# Patient Record
Sex: Female | Born: 1950
Health system: Southern US, Community
[De-identification: ages and names within clinical notes are randomized; demographics above are authoritative.]

## PROBLEM LIST (undated history)

## (undated) DIAGNOSIS — I10 Essential (primary) hypertension: Secondary | ICD-10-CM

## (undated) DIAGNOSIS — M1990 Unspecified osteoarthritis, unspecified site: Secondary | ICD-10-CM

## (undated) DIAGNOSIS — E785 Hyperlipidemia, unspecified: Secondary | ICD-10-CM

## (undated) DIAGNOSIS — M8580 Other specified disorders of bone density and structure, unspecified site: Secondary | ICD-10-CM

## (undated) DIAGNOSIS — M1711 Unilateral primary osteoarthritis, right knee: Secondary | ICD-10-CM

## (undated) HISTORY — DX: Hyperlipidemia, unspecified: E78.5

## (undated) HISTORY — DX: Other specified disorders of bone density and structure, unspecified site: M85.80

## (undated) HISTORY — PX: CATARACT EXTRACTION: SUR2

## (undated) HISTORY — PX: OOPHORECTOMY: SHX86

## (undated) HISTORY — DX: Essential (primary) hypertension: I10

## (undated) HISTORY — PX: CLOSED REDUCTION HAND FRACTURE: SHX973

---

## 1983-09-03 HISTORY — PX: TUBAL LIGATION: SHX77

## 2005-11-04 ENCOUNTER — Ambulatory Visit (HOSPITAL_COMMUNITY): Admission: RE | Admit: 2005-11-04 | Discharge: 2005-11-04 | Payer: Self-pay | Admitting: Obstetrics & Gynecology

## 2005-11-14 ENCOUNTER — Other Ambulatory Visit: Admission: RE | Admit: 2005-11-14 | Discharge: 2005-11-14 | Payer: Self-pay | Admitting: Obstetrics & Gynecology

## 2006-12-03 ENCOUNTER — Ambulatory Visit (HOSPITAL_COMMUNITY): Admission: RE | Admit: 2006-12-03 | Discharge: 2006-12-03 | Payer: Self-pay | Admitting: Obstetrics & Gynecology

## 2006-12-18 ENCOUNTER — Other Ambulatory Visit: Admission: RE | Admit: 2006-12-18 | Discharge: 2006-12-18 | Payer: Self-pay | Admitting: Obstetrics & Gynecology

## 2007-12-21 ENCOUNTER — Ambulatory Visit (HOSPITAL_COMMUNITY): Admission: RE | Admit: 2007-12-21 | Discharge: 2007-12-21 | Payer: Self-pay | Admitting: Obstetrics & Gynecology

## 2007-12-25 ENCOUNTER — Ambulatory Visit (HOSPITAL_COMMUNITY): Admission: RE | Admit: 2007-12-25 | Discharge: 2007-12-25 | Payer: Self-pay | Admitting: Obstetrics & Gynecology

## 2007-12-28 ENCOUNTER — Other Ambulatory Visit: Admission: RE | Admit: 2007-12-28 | Discharge: 2007-12-28 | Payer: Self-pay | Admitting: Obstetrics & Gynecology

## 2008-12-21 ENCOUNTER — Ambulatory Visit (HOSPITAL_COMMUNITY): Admission: RE | Admit: 2008-12-21 | Discharge: 2008-12-21 | Payer: Self-pay | Admitting: Obstetrics & Gynecology

## 2010-01-15 ENCOUNTER — Ambulatory Visit (HOSPITAL_COMMUNITY): Admission: RE | Admit: 2010-01-15 | Discharge: 2010-01-15 | Payer: Self-pay | Admitting: Obstetrics & Gynecology

## 2010-10-03 HISTORY — PX: WRIST SURGERY: SHX841

## 2010-12-31 ENCOUNTER — Other Ambulatory Visit: Payer: Self-pay | Admitting: Obstetrics & Gynecology

## 2010-12-31 DIAGNOSIS — Z1231 Encounter for screening mammogram for malignant neoplasm of breast: Secondary | ICD-10-CM

## 2011-02-01 ENCOUNTER — Ambulatory Visit (HOSPITAL_COMMUNITY)
Admission: RE | Admit: 2011-02-01 | Discharge: 2011-02-01 | Disposition: A | Discharge status: Home / Self Care | Payer: BC Managed Care – PPO | Source: Ambulatory Visit | Attending: Obstetrics & Gynecology | Admitting: Obstetrics & Gynecology

## 2011-02-01 DIAGNOSIS — Z1231 Encounter for screening mammogram for malignant neoplasm of breast: Secondary | ICD-10-CM | POA: Insufficient documentation

## 2012-01-10 ENCOUNTER — Other Ambulatory Visit: Payer: Self-pay | Admitting: Obstetrics & Gynecology

## 2012-01-10 DIAGNOSIS — Z1231 Encounter for screening mammogram for malignant neoplasm of breast: Secondary | ICD-10-CM

## 2012-02-11 ENCOUNTER — Ambulatory Visit (HOSPITAL_COMMUNITY)
Admission: RE | Admit: 2012-02-11 | Discharge: 2012-02-11 | Disposition: A | Discharge status: Home / Self Care | Payer: BC Managed Care – PPO | Source: Ambulatory Visit | Attending: Obstetrics & Gynecology | Admitting: Obstetrics & Gynecology

## 2012-02-11 DIAGNOSIS — Z1231 Encounter for screening mammogram for malignant neoplasm of breast: Secondary | ICD-10-CM | POA: Insufficient documentation

## 2013-01-26 ENCOUNTER — Other Ambulatory Visit: Payer: Self-pay | Admitting: Obstetrics & Gynecology

## 2013-01-26 DIAGNOSIS — Z1231 Encounter for screening mammogram for malignant neoplasm of breast: Secondary | ICD-10-CM

## 2013-03-15 ENCOUNTER — Ambulatory Visit (HOSPITAL_COMMUNITY)
Admission: RE | Admit: 2013-03-15 | Discharge: 2013-03-15 | Disposition: A | Discharge status: Home / Self Care | Payer: BC Managed Care – PPO | Source: Ambulatory Visit | Attending: Obstetrics & Gynecology | Admitting: Obstetrics & Gynecology

## 2013-03-15 DIAGNOSIS — Z1231 Encounter for screening mammogram for malignant neoplasm of breast: Secondary | ICD-10-CM | POA: Insufficient documentation

## 2013-03-24 ENCOUNTER — Ambulatory Visit: Payer: Self-pay | Admitting: Obstetrics & Gynecology

## 2013-03-26 ENCOUNTER — Encounter: Payer: Self-pay | Admitting: Obstetrics & Gynecology

## 2013-03-26 ENCOUNTER — Ambulatory Visit (INDEPENDENT_AMBULATORY_CARE_PROVIDER_SITE_OTHER): Payer: BC Managed Care – PPO | Admitting: Obstetrics & Gynecology

## 2013-03-26 VITALS — BP 152/72 | HR 60 | Resp 12 | Ht 67.5 in | Wt 174.2 lb

## 2013-03-26 DIAGNOSIS — Z01419 Encounter for gynecological examination (general) (routine) without abnormal findings: Secondary | ICD-10-CM

## 2013-03-26 DIAGNOSIS — M949 Disorder of cartilage, unspecified: Secondary | ICD-10-CM

## 2013-03-26 DIAGNOSIS — M8580 Other specified disorders of bone density and structure, unspecified site: Secondary | ICD-10-CM

## 2013-03-26 DIAGNOSIS — Z0000 Encounter for general adult medical examination without abnormal findings: Secondary | ICD-10-CM

## 2013-03-26 NOTE — Patient Instructions (Addendum)

## 2013-03-26 NOTE — Progress Notes (Signed)
62 y.o. G2P2 MarriedCaucasianF here for annual exam.  No vaginal bleeding.  Has traveled a lot this summer.  Patient's last menstrual period was 09/02/1998.          Sexually active: yes  The current method of family planning is tubal ligation.    Exercising: yes  walking 4 miles/daily Smoker:  no  Health Maintenance: Pap:  02/24/12 WNL/negative HR HPV History of abnormal Pap:  no MMG:  03/15/13 normal Colonoscopy:  1999-knows it is due BMD:   2009 mild osteopenia in hips, -0.4/ -1.8 TDaP:  4/08 Screening Labs: 11/13 with Dr. Valentina Lucks, PCP   reports that she has never smoked. She has never used smokeless tobacco. She reports that she does not drink alcohol or use illicit drugs.  Past Medical History  Diagnosis Date  . Anemia     as a child  . Osteopenia     right hip  . Hypertension   . Elevated lipids     Past Surgical History  Procedure Laterality Date  . Tubal ligation  1985  . Oophorectomy      right  . Wrist surgery  2/12    Current Outpatient Prescriptions  Medication Sig Dispense Refill  . Ascorbic Acid (VITAMIN C) 1000 MG tablet Take 1,000 mg by mouth daily.      Marland Kitchen BIOTIN PO Take by mouth daily.      Marland Kitchen CALCIUM PO Take 1,400 mg by mouth daily.      . Cholecalciferol (VITAMIN D PO) Take 1,600 Int'l Units by mouth daily.      . Glucosamine-Chondroit-Vit C-Mn (GLUCOSAMINE 1500 COMPLEX PO) Take by mouth daily.      . Multiple Vitamins-Minerals (CENTRUM SILVER PO) Take by mouth.      . AZOR 10-40 MG per tablet        No current facility-administered medications for this visit.    Family History  Problem Relation Age of Onset  . Breast cancer Mother 71  . Stomach cancer Father   . Heart disease Father     ROS:  Pertinent items are noted in HPI.  Otherwise, a comprehensive ROS was negative.  Exam:   BP 152/72  Pulse 60  Resp 12  Ht 5' 7.5" (1.715 m)  Wt 174 lb 3.2 oz (79.017 kg)  BMI 26.87 kg/m2  LMP 09/02/1998  Weight change: +12lbs  Height: 5' 7.5"  (171.5 cm)  Ht Readings from Last 3 Encounters:  03/26/13 5' 7.5" (1.715 m)    General appearance: alert, cooperative and appears stated age Head: Normocephalic, without obvious abnormality, atraumatic Neck: no adenopathy, supple, symmetrical, trachea midline and thyroid normal to inspection and palpation Lungs: clear to auscultation bilaterally Breasts: normal appearance, no masses or tenderness Heart: regular rate and rhythm Abdomen: soft, non-tender; bowel sounds normal; no masses,  no organomegaly Extremities: extremities normal, atraumatic, no cyanosis or edema Skin: Skin color, texture, turgor normal. No rashes or lesions Lymph nodes: Cervical, supraclavicular, and axillary nodes normal. No abnormal inguinal nodes palpated Neurologic: Grossly normal   Pelvic: External genitalia:  no lesions              Urethra:  normal appearing urethra with no masses, tenderness or lesions              Bartholins and Skenes: normal                 Vagina: normal appearing vagina with normal color and discharge, no lesions  Cervix: no lesions              Pap taken: no Bimanual Exam:  Uterus:  normal size, contour, position, consistency, mobility, non-tender              Adnexa: normal adnexa and no mass, fullness, tenderness               Rectovaginal: Confirms               Anus:  normal sphincter tone, no lesions  A:  Well Woman with normal exam Osteopenia Hypertension Elevated lipids--improved with weight loss Family hx of breast cancer--mother (diagnosed age 67)  P:   Mammogram yearly pap smear with neg HR HPV Declines colonoscopy.  Oc light given. Labs with PCP yearly. return annually or prn  An After Visit Summary was printed and given to the patient.

## 2013-06-28 ENCOUNTER — Telehealth: Payer: Self-pay | Admitting: Obstetrics & Gynecology

## 2013-06-28 NOTE — Telephone Encounter (Signed)
Spoke with pt about Pap schedule. Advised that Dr. Hyacinth Meeker did not do a Pap at her July visit, but will most likely do one at her visit next year. Advised that since she has never had an abnormal Pap, and taking into consideration her age group and that she is married, she doesn't need one every year. Pt agreeable. She just saw a Mychart message saying it was overdue, so she thought she'd better check.

## 2013-06-28 NOTE — Telephone Encounter (Signed)
Patient calling for July 2014 pap results please? MyChart shows her as overdue?

## 2013-07-08 ENCOUNTER — Other Ambulatory Visit: Payer: Self-pay

## 2014-04-14 ENCOUNTER — Other Ambulatory Visit: Payer: Self-pay | Admitting: Obstetrics & Gynecology

## 2014-04-14 ENCOUNTER — Telehealth: Payer: Self-pay | Admitting: Obstetrics & Gynecology

## 2014-04-14 DIAGNOSIS — Z1231 Encounter for screening mammogram for malignant neoplasm of breast: Secondary | ICD-10-CM

## 2014-04-14 DIAGNOSIS — M8580 Other specified disorders of bone density and structure, unspecified site: Secondary | ICD-10-CM

## 2014-04-14 NOTE — Telephone Encounter (Signed)
[facility]

## 2014-04-14 NOTE — Telephone Encounter (Signed)
Left message to call Diasia Henken at [phone].   Advise [facility].

## 2014-04-21 NOTE — Telephone Encounter (Signed)
Patient is scheduled [facility] for 05[facility]18/14.

## 2014-05-18 ENCOUNTER — Ambulatory Visit (HOSPITAL_COMMUNITY)
Admission: RE | Admit: 2014-05-18 | Discharge: 2014-05-18 | Disposition: A | Discharge status: Home / Self Care | Payer: Medicare Other | Source: Ambulatory Visit | Attending: Obstetrics & Gynecology | Admitting: Obstetrics & Gynecology

## 2014-05-18 DIAGNOSIS — Z1231 Encounter for screening mammogram for malignant neoplasm of breast: Secondary | ICD-10-CM | POA: Diagnosis present

## 2014-05-18 DIAGNOSIS — Z13820 Encounter for screening for osteoporosis: Secondary | ICD-10-CM | POA: Diagnosis not present

## 2014-05-18 DIAGNOSIS — Z780 Asymptomatic menopausal state: Secondary | ICD-10-CM | POA: Insufficient documentation

## 2014-05-26 ENCOUNTER — Telehealth: Payer: Self-pay

## 2014-05-26 NOTE — Telephone Encounter (Signed)
Message copied by Elisha Headland on Thu May 26, 2014 11:59 AM ------      Message from: Jerene Bears      Created: Wed May 25, 2014  2:15 PM       Signed off hard copy. ------

## 2014-05-26 NOTE — Telephone Encounter (Signed)
lmtcb to discuss (hard copy) BMD results.//kn

## 2014-05-26 NOTE — Telephone Encounter (Signed)
Patient aware of BMD results. Will discuss at her [facility]EX scheduled for 06/15/14.//kn

## 2014-06-02 ENCOUNTER — Ambulatory Visit: Payer: BC Managed Care – PPO | Admitting: Obstetrics & Gynecology

## 2014-06-15 ENCOUNTER — Encounter: Payer: Self-pay | Admitting: Obstetrics & Gynecology

## 2014-06-15 ENCOUNTER — Ambulatory Visit (INDEPENDENT_AMBULATORY_CARE_PROVIDER_SITE_OTHER): Payer: Medicare Other | Admitting: Obstetrics & Gynecology

## 2014-06-15 ENCOUNTER — Ambulatory Visit: Payer: BC Managed Care – PPO | Admitting: Obstetrics & Gynecology

## 2014-06-15 VITALS — BP 136/60 | HR 68 | Resp 12 | Ht 67.5 in | Wt 178.2 lb

## 2014-06-15 DIAGNOSIS — Z01419 Encounter for gynecological examination (general) (routine) without abnormal findings: Secondary | ICD-10-CM

## 2014-06-15 DIAGNOSIS — Z1211 Encounter for screening for malignant neoplasm of colon: Secondary | ICD-10-CM

## 2014-06-15 DIAGNOSIS — Z124 Encounter for screening for malignant neoplasm of cervix: Secondary | ICD-10-CM

## 2014-06-15 DIAGNOSIS — M810 Age-related osteoporosis without current pathological fracture: Secondary | ICD-10-CM

## 2014-06-15 NOTE — Progress Notes (Signed)
63 y.o. G2P2 MarriedCaucasianF here for annual exam.  D/W pt BMD results.  She did this 05/18/14.  Right hip with increased bone loss.  Now t score -2.8.  Was -1.8.  She stopped Fosamax in 2008.  BMD then with T score -1.8.  I feel should check for secondary causes and if this is all normal, will repeat BMD two years.  Pt not ready to restart medications at this time.  Pt does have remote hx of hip injury with probable arthritis in hip now.    Patient's last menstrual period was 09/02/1998.          Sexually active: Yes.    The current method of family planning is tubal ligation.    Exercising: Yes.    brisk walk-3 miles daily Smoker:  no  Health Maintenance: Pap:  02/24/12 WNL/negative HR HPV History of abnormal Pap:  no MMG:  05/18/14-normal Colonoscopy:  1999-repeat in 10 years-aware is due BMD:   05/18/14-osteoporosis in hip TDaP:  2008 Screening Labs: PCP, Hb today: PCP, Urine today: PCP   reports that she has never smoked. She has never used smokeless tobacco. She reports that she does not drink alcohol or use illicit drugs.  Past Medical History  Diagnosis Date  . Osteopenia     right hip  . Hypertension   . Elevated lipids     Past Surgical History  Procedure Laterality Date  . Tubal ligation  1985  . Oophorectomy      right  . Wrist surgery  2/12    Current Outpatient Prescriptions  Medication Sig Dispense Refill  . Ascorbic Acid (VITAMIN C) 1000 MG tablet Take 1,000 mg by mouth daily.      . AZOR 10-40 MG per tablet       . BIOTIN PO Take by mouth daily.      Marland Kitchen. CALCIUM PO Take 1,400 mg by mouth daily.      . Cholecalciferol (VITAMIN D PO) Take 1,200 Int'l Units by mouth daily.       . Glucosamine-Chondroit-Vit C-Mn (GLUCOSAMINE 1500 COMPLEX PO) Take by mouth daily.      . Multiple Vitamins-Minerals (CENTRUM SILVER PO) Take by mouth.       No current facility-administered medications for this visit.    Family History  Problem Relation Age of Onset  . Breast  cancer Mother [id number]  . Stomach cancer Father   . Heart disease Father     ROS:  Pertinent items are noted in HPI.  Otherwise, a comprehensive ROS was negative.  Exam:   BP 136/60  Pulse 68  Resp 12  Ht 5' 7.5" (1.715 m)  Wt 178 lb 3.2 oz (80.831 kg)  BMI 27.48 kg/m2  LMP 09/02/1998  Weight change:  +4#  Height: 5' 7.5" (171.5 cm)  Ht Readings from Last 3 Encounters:  06/15/14 5' 7.5" (1.715 m)  03/26/13 5' 7.5" (1.715 m)    General appearance: alert, cooperative and appears stated age Head: Normocephalic, without obvious abnormality, atraumatic Neck: no adenopathy, supple, symmetrical, trachea midline and thyroid normal to inspection and palpation Lungs: clear to auscultation bilaterally Breasts: normal appearance, no masses or tenderness Heart: regular rate and rhythm Abdomen: soft, non-tender; bowel sounds normal; no masses,  no organomegaly Extremities: extremities normal, atraumatic, no cyanosis or edema Skin: Skin color, texture, turgor normal. No rashes or lesions Lymph nodes: Cervical, supraclavicular, and axillary nodes normal. No abnormal inguinal nodes palpated Neurologic: Grossly normal   Pelvic: External genitalia:  no  lesions              Urethra:  normal appearing urethra with no masses, tenderness or lesions              Bartholins and Skenes: normal                 Vagina: normal appearing vagina with normal color and discharge, no lesions              Cervix: no lesions              Pap taken: Yes.   Bimanual Exam:  Uterus:  normal size, contour, position, consistency, mobility, non-tender              Adnexa: normal adnexa and no mass, fullness, tenderness               Rectovaginal: Confirms               Anus:  normal sphincter tone, no lesions  A:  Well Woman with normal exam  PMP, no HRT Osteopenia hx, now with osteoporosis in her right hip.  Spine and left hip still with osteopenia. Hypertension  Elevated lipids--improved with weight loss  Family  hx of breast cancer--mother (diagnosed age 65)   P: Mammogram yearly  pap smear with neg HR HPV 2013.  Pap only today Declines colonoscopy. Oc light given.  Labs with PCP yearly.  D/W pt BMD.  Feel should have TSH, Vit D recheck, PTH, calcium.  She wants to discuss with D.r Valentina LucksGriffin.  If normal, optomize calcium in diet/supplement and repeat BMD 2 years.  Change in BMD may some be due to stopping Fosamax (which I recommended as her BMD only showed osteopenia).  She does not want to be on medication at this time, either, so is in agreement with this recommendation. return annually or prn  An After Visit Summary was printed and given to the patient.

## 2014-06-15 NOTE — Addendum Note (Signed)
Addended by: Jerene BearsMILLER, Faelyn Sigler S on: 06/15/2014 12:36 PM   Modules accepted: Level of Service

## 2014-06-16 LAB — IPS PAP TEST WITH REFLEX TO HPV

## 2014-07-04 ENCOUNTER — Encounter: Payer: Self-pay | Admitting: Obstetrics & Gynecology

## 2014-07-30 ENCOUNTER — Encounter: Payer: Self-pay | Admitting: Obstetrics & Gynecology

## 2014-08-04 LAB — FECAL OCCULT BLOOD, IMMUNOCHEMICAL: IMMUNOLOGICAL FECAL OCCULT BLOOD TEST: NEGATIVE

## 2014-08-06 ENCOUNTER — Encounter: Payer: Self-pay | Admitting: Obstetrics & Gynecology

## 2015-02-27 ENCOUNTER — Other Ambulatory Visit: Payer: Self-pay

## 2015-02-27 ENCOUNTER — Encounter: Payer: Self-pay | Admitting: Obstetrics & Gynecology

## 2015-07-04 ENCOUNTER — Other Ambulatory Visit: Payer: Self-pay

## 2015-07-04 DIAGNOSIS — Z1231 Encounter for screening mammogram for malignant neoplasm of breast: Secondary | ICD-10-CM

## 2015-07-31 ENCOUNTER — Ambulatory Visit
Admission: RE | Admit: 2015-07-31 | Discharge: 2015-07-31 | Disposition: A | Discharge status: Home / Self Care | Payer: Medicare Other | Source: Ambulatory Visit

## 2015-07-31 DIAGNOSIS — Z1231 Encounter for screening mammogram for malignant neoplasm of breast: Secondary | ICD-10-CM

## 2015-08-08 ENCOUNTER — Encounter: Payer: Self-pay | Admitting: Obstetrics & Gynecology

## 2015-08-08 ENCOUNTER — Ambulatory Visit (INDEPENDENT_AMBULATORY_CARE_PROVIDER_SITE_OTHER): Payer: Medicare Other | Admitting: Obstetrics & Gynecology

## 2015-08-08 VITALS — BP 130/70 | HR 74 | Resp 12 | Ht 67.25 in | Wt 177.0 lb

## 2015-08-08 DIAGNOSIS — M810 Age-related osteoporosis without current pathological fracture: Secondary | ICD-10-CM

## 2015-08-08 DIAGNOSIS — Z01419 Encounter for gynecological examination (general) (routine) without abnormal findings: Secondary | ICD-10-CM

## 2015-08-08 DIAGNOSIS — I10 Essential (primary) hypertension: Secondary | ICD-10-CM | POA: Diagnosis not present

## 2015-08-08 DIAGNOSIS — Z124 Encounter for screening for malignant neoplasm of cervix: Secondary | ICD-10-CM

## 2015-08-08 DIAGNOSIS — Z1211 Encounter for screening for malignant neoplasm of colon: Secondary | ICD-10-CM

## 2015-08-08 NOTE — Progress Notes (Signed)
64 y.o. G2P2 MarriedCaucasianF here for annual exam.  Doing well.  No vaginal bleeding.  Planning BMD next year with MMG.  She will schedule this next year.  Has declined any treatment for this currently but does want to continue to follow with testing.    PCP:  Dr. Valentina Lucks.  Has appt next Tuesday.  Will do blood work on Friday.    Patient's last menstrual period was 09/02/1998.          Sexually active: Yes.    The current method of family planning is post menopausal status.    Exercising: Yes.    Walking 3 miles a day  Smoker:  no  Health Maintenance: Pap:  06/15/14 Neg, neg pap with neg HR HPV 2013 History of abnormal Pap:  yes MMG:  07/31/15 BIRADS1:neg Colonoscopy:  1999 Normal Repeat 10 years  BMD:   05/18/14 Osteoporosis  TDaP:  2008  Screening Labs: PCP, Hb today: PCP, Urine today: PCP   reports that she has never smoked. She has never used smokeless tobacco. She reports that she does not drink alcohol or use illicit drugs.  Past Medical History  Diagnosis Date  . Osteopenia     right hip  . Hypertension   . Elevated lipids     Past Surgical History  Procedure Laterality Date  . Tubal ligation  1985  . Oophorectomy      right  . Wrist surgery  2/12    Current Outpatient Prescriptions  Medication Sig Dispense Refill  . Ascorbic Acid (VITAMIN C) 1000 MG tablet Take 1,000 mg by mouth daily.    . AZOR 10-40 MG per tablet     . BIOTIN PO Take by mouth daily.    Marland Kitchen CALCIUM PO Take 1,400 mg by mouth daily.    . Cholecalciferol (VITAMIN D PO) Take 1,200 Int'l Units by mouth daily.     . Glucosamine-Chondroit-Vit C-Mn (GLUCOSAMINE 1500 COMPLEX PO) Take by mouth daily.    . Multiple Vitamins-Minerals (CENTRUM SILVER PO) Take by mouth.     No current facility-administered medications for this visit.    Family History  Problem Relation Age of Onset  . Breast cancer Mother 52  . Stomach cancer Father   . Heart disease Father     ROS:  Pertinent items are noted in  HPI.  Otherwise, a comprehensive ROS was negative.  Exam:   BP 130/70 mmHg  Pulse 74  Resp 12  Ht 5' 7.25" (1.708 m)  Wt 177 lb (80.287 kg)  BMI 27.52 kg/m2  LMP 09/02/1998  Weight change: stable  Height: 5' 7.25" (170.8 cm)  Ht Readings from Last 3 Encounters:  08/08/15 5' 7.25" (1.708 m)  06/15/14 5' 7.5" (1.715 m)  03/26/13 5' 7.5" (1.715 m)    General appearance: alert, cooperative and appears stated age Head: Normocephalic, without obvious abnormality, atraumatic Neck: no adenopathy, supple, symmetrical, trachea midline and thyroid normal to inspection and palpation Lungs: clear to auscultation bilaterally Breasts: normal appearance, no masses or tenderness Heart: regular rate and rhythm Abdomen: soft, non-tender; bowel sounds normal; no masses,  no organomegaly Extremities: extremities normal, atraumatic, no cyanosis or edema Skin: Skin color, texture, turgor normal. No rashes or lesions Lymph nodes: Cervical, supraclavicular, and axillary nodes normal. No abnormal inguinal nodes palpated Neurologic: Grossly normal   Pelvic: External genitalia:  no lesions              Urethra:  normal appearing urethra with no masses, tenderness or lesions  Bartholins and Skenes: normal                 Vagina: normal appearing vagina with normal color and discharge, no lesions              Cervix: no lesions              Pap taken: No. Bimanual Exam:  Uterus:  normal size, contour, position, consistency, mobility, non-tender              Adnexa: normal adnexa and no mass, fullness, tenderness               Rectovaginal: Confirms               Anus:  normal sphincter tone, no lesions  Chaperone was present for exam.  A:  Well Woman with normal exam  PMP, no HRT Osteopenia hx, now with osteoporosis in her right hip. Spine and left hip still with osteopenia.  Declines treatment.   Hypertension  Elevated lipids, just being followed for this Family hx of breast  cancer--mother (diagnosed age 66)   P: Mammogram yearly  pap smear with neg HR HPV 2013. Pap neg 2015.  No pap today.  Declines colonoscopy. Oc light given.  Labs with PCP yearly. Scheduled for the end of the week. Planning BMD with MMG next year.  Order placed.  She did have Vit D with PCP last year. Return annually or prn

## 2015-08-29 LAB — FECAL OCCULT BLOOD, IMMUNOCHEMICAL: IFOBT: NEGATIVE

## 2016-06-10 ENCOUNTER — Other Ambulatory Visit: Payer: Self-pay | Admitting: Obstetrics & Gynecology

## 2016-06-10 DIAGNOSIS — Z1231 Encounter for screening mammogram for malignant neoplasm of breast: Secondary | ICD-10-CM

## 2016-07-31 ENCOUNTER — Ambulatory Visit
Admission: RE | Admit: 2016-07-31 | Discharge: 2016-07-31 | Disposition: A | Discharge status: Home / Self Care | Payer: Medicare Other | Source: Ambulatory Visit | Attending: Obstetrics & Gynecology | Admitting: Obstetrics & Gynecology

## 2016-07-31 DIAGNOSIS — Z1231 Encounter for screening mammogram for malignant neoplasm of breast: Secondary | ICD-10-CM

## 2016-08-12 ENCOUNTER — Encounter: Payer: Self-pay | Admitting: Obstetrics & Gynecology

## 2016-08-12 ENCOUNTER — Ambulatory Visit (INDEPENDENT_AMBULATORY_CARE_PROVIDER_SITE_OTHER): Payer: Medicare Other | Admitting: Obstetrics & Gynecology

## 2016-08-12 VITALS — BP 116/68 | HR 72 | Resp 14 | Ht 67.0 in | Wt 182.0 lb

## 2016-08-12 DIAGNOSIS — Z1211 Encounter for screening for malignant neoplasm of colon: Secondary | ICD-10-CM

## 2016-08-12 DIAGNOSIS — Z124 Encounter for screening for malignant neoplasm of cervix: Secondary | ICD-10-CM | POA: Diagnosis not present

## 2016-08-12 DIAGNOSIS — Z01419 Encounter for gynecological examination (general) (routine) without abnormal findings: Secondary | ICD-10-CM | POA: Diagnosis not present

## 2016-08-12 NOTE — Progress Notes (Signed)
65 y.o. G2P2 MarriedCaucasianF here for annual exam.  Doing well.  No vaginal bleeding.  Has bene a good year.    Patient's last menstrual period was 09/02/1998.          Sexually active: Yes.    The current method of family planning is tubal ligation and post menopausal status.    Exercising: Yes.    exercycle, treadmill Smoker:  no  Health Maintenance: Pap:  06/15/14 Neg. 02/24/12 Neg. HR HPV:neg  History of abnormal Pap:  no MMG:  07/31/16 BIRADS1:Neg  Colonoscopy:  1999 f/u 10 years  BMD:   05/18/14 Osteoporosis  TDaP:  2008  Pneumonia vaccine(s):  2015 Zostavax:   2012 Hep C testing: did with PCP Screening Labs: has blood work scheduled this week.   reports that she has never smoked. She has never used smokeless tobacco. She reports that she does not drink alcohol or use drugs.  Past Medical History:  Diagnosis Date  . Elevated lipids   . Hypertension   . Osteopenia    right hip    Past Surgical History:  Procedure Laterality Date  . OOPHORECTOMY     right  . TUBAL LIGATION  1985  . WRIST SURGERY  2/12    Current Outpatient Prescriptions  Medication Sig Dispense Refill  . Ascorbic Acid (VITAMIN C) 1000 MG tablet Take 1,000 mg by mouth daily.    . AZOR 10-40 MG per tablet     . BIOTIN PO Take by mouth daily.    Marland Kitchen. CALCIUM PO Take 1,400 mg by mouth daily.    . Cholecalciferol (VITAMIN D PO) Take 1,200 Int'l Units by mouth daily.     . Glucosamine-Chondroit-Vit C-Mn (GLUCOSAMINE 1500 COMPLEX PO) Take by mouth daily.     No current facility-administered medications for this visit.     Family History  Problem Relation Age of Onset  . Breast cancer Mother [id number]  . Stomach cancer Father   . Heart disease Father     ROS:  Pertinent items are noted in HPI.  Otherwise, a comprehensive ROS was negative.  Exam:   BP 116/68 (BP Location: Right Arm, Patient Position: Sitting, Cuff Size: Normal)   Pulse 72   Resp 14   Ht 5\' 7"  (1.702 m)   Wt 182 lb (82.6 kg)   LMP  09/02/1998   BMI 28.51 kg/m   Height: 5\' 7"  (170.2 cm)  Ht Readings from Last 3 Encounters:  08/12/16 5\' 7"  (1.702 m)  08/08/15 5' 7.25" (1.708 m)  06/15/14 5' 7.5" (1.715 m)   General appearance: alert, cooperative and appears stated age Head: Normocephalic, without obvious abnormality, atraumatic Neck: no adenopathy, supple, symmetrical, trachea midline and thyroid normal to inspection and palpation Lungs: clear to auscultation bilaterally Breasts: normal appearance, no masses or tenderness Heart: regular rate and rhythm Abdomen: soft, non-tender; bowel sounds normal; no masses,  no organomegaly Extremities: extremities normal, atraumatic, no cyanosis or edema Skin: Skin color, texture, turgor normal. No rashes or lesions Lymph nodes: Cervical, supraclavicular, and axillary nodes normal. No abnormal inguinal nodes palpated Neurologic: Grossly normal   Pelvic: External genitalia:  no lesions              Urethra:  normal appearing urethra with no masses, tenderness or lesions              Bartholins and Skenes: normal                 Vagina: normal appearing vagina  with normal color and discharge, no lesions              Cervix: no lesions              Pap taken: Yes.   Bimanual Exam:  Uterus:  normal size, contour, position, consistency, mobility, non-tender              Adnexa: normal adnexa and no mass, fullness, tenderness               Rectovaginal: Confirms               Anus:  normal sphincter tone, no lesions  Chaperone was present for exam.  A:     Well Woman with normal exam  PMP, no HRT Osteopenia hx, now with osteoporosis in her right hip.   Hypertension  Elevated lipids, just being followed for this Family hx of breast cancer--mother (diagnosed age 879)    P:  Mammogram yearly  Pap smear today  Declines colonoscopy. Oc light given.  Labs with PCP yearly. Scheduled for later this week. Declines BMD and declines treatment. Return annually or prn

## 2016-08-13 LAB — PAP IG (IMAGE GUIDED)

## 2016-08-28 LAB — FECAL OCCULT BLOOD, IMMUNOCHEMICAL: IMMUNOLOGICAL FECAL OCCULT BLOOD TEST: NEGATIVE

## 2016-08-28 NOTE — Addendum Note (Signed)
Addended by: Zenovia JordanMITCHELL, Airon Sahni A on: 08/28/2016 08:18 AM   Modules accepted: Orders

## 2016-09-25 ENCOUNTER — Emergency Department (HOSPITAL_COMMUNITY)
Admission: EM | Admit: 2016-09-25 | Discharge: 2016-09-26 | Disposition: A | Discharge status: Home / Self Care | Payer: Medicare Other | Attending: Emergency Medicine | Admitting: Emergency Medicine

## 2016-09-25 ENCOUNTER — Encounter (HOSPITAL_COMMUNITY): Payer: Self-pay | Admitting: Emergency Medicine

## 2016-09-25 ENCOUNTER — Emergency Department (HOSPITAL_COMMUNITY): Payer: Medicare Other

## 2016-09-25 DIAGNOSIS — I10 Essential (primary) hypertension: Secondary | ICD-10-CM | POA: Diagnosis not present

## 2016-09-25 DIAGNOSIS — Z79899 Other long term (current) drug therapy: Secondary | ICD-10-CM | POA: Diagnosis not present

## 2016-09-25 DIAGNOSIS — S61208A Unspecified open wound of other finger without damage to nail, initial encounter: Secondary | ICD-10-CM

## 2016-09-25 DIAGNOSIS — Y999 Unspecified external cause status: Secondary | ICD-10-CM | POA: Diagnosis not present

## 2016-09-25 DIAGNOSIS — Y92481 Parking lot as the place of occurrence of the external cause: Secondary | ICD-10-CM | POA: Diagnosis not present

## 2016-09-25 DIAGNOSIS — S62317A Displaced fracture of base of fifth metacarpal bone. left hand, initial encounter for closed fracture: Secondary | ICD-10-CM | POA: Insufficient documentation

## 2016-09-25 DIAGNOSIS — S63269A Dislocation of metacarpophalangeal joint of unspecified finger, initial encounter: Secondary | ICD-10-CM

## 2016-09-25 DIAGNOSIS — T148XXA Other injury of unspecified body region, initial encounter: Secondary | ICD-10-CM

## 2016-09-25 DIAGNOSIS — S61217A Laceration without foreign body of left little finger without damage to nail, initial encounter: Secondary | ICD-10-CM | POA: Insufficient documentation

## 2016-09-25 DIAGNOSIS — Y9301 Activity, walking, marching and hiking: Secondary | ICD-10-CM | POA: Diagnosis not present

## 2016-09-25 DIAGNOSIS — S63287A Dislocation of proximal interphalangeal joint of left little finger, initial encounter: Secondary | ICD-10-CM | POA: Insufficient documentation

## 2016-09-25 DIAGNOSIS — S6992XA Unspecified injury of left wrist, hand and finger(s), initial encounter: Secondary | ICD-10-CM | POA: Diagnosis present

## 2016-09-25 DIAGNOSIS — W0110XA Fall on same level from slipping, tripping and stumbling with subsequent striking against unspecified object, initial encounter: Secondary | ICD-10-CM | POA: Diagnosis not present

## 2016-09-25 MED ORDER — TETANUS-DIPHTH-ACELL PERTUSSIS 5-2.5-18.5 LF-MCG/0.5 IM SUSP
0.5000 mL | Freq: Once | INTRAMUSCULAR | Status: AC
  Administered 2016-09-25: 0.5 mL via INTRAMUSCULAR
  Filled 2016-09-25: qty 0.5

## 2016-09-25 MED ORDER — CEFAZOLIN IN D5W 1 GM/50ML IV SOLN
1.0000 g | Freq: Once | INTRAVENOUS | Status: AC
  Administered 2016-09-25: 1 g via INTRAVENOUS
  Filled 2016-09-25: qty 50

## 2016-09-25 MED ORDER — BUPIVACAINE HCL (PF) 0.5 % IJ SOLN
50.0000 mL | Freq: Once | INTRAMUSCULAR | Status: AC
  Administered 2016-09-26: 50 mL
  Filled 2016-09-25: qty 60

## 2016-09-25 NOTE — ED Notes (Signed)
Pt refused pain medication at this time. 

## 2016-09-25 NOTE — ED Triage Notes (Addendum)
Pt was walking in a parking lot tonight and tripped over a cement block and landed on her left hand.  Patient has left hand wrapped in towel upon arrival.  States the pain has just become numbing at this point. Upon assessment, patient's had bruised with swelling and possible deformity.  Several small abrasions on hand.  Bleeding controlled at this time.

## 2016-09-26 ENCOUNTER — Emergency Department (HOSPITAL_COMMUNITY): Payer: Medicare Other

## 2016-09-26 MED ORDER — CEPHALEXIN 500 MG PO CAPS: 500.0000 mg | ORAL_CAPSULE | Freq: Four times a day (QID) | ORAL | 0 refills | Status: DC

## 2016-09-26 MED ORDER — HYDROCODONE-ACETAMINOPHEN 5-325 MG PO TABS: 1.0000 | ORAL_TABLET | Freq: Four times a day (QID) | ORAL | 0 refills | Status: DC | PRN

## 2016-09-26 NOTE — Discharge Instructions (Signed)
Call Dr. Ronie SpiesWeingold's office this morning at 8 AM for an appointment

## 2016-09-26 NOTE — ED Notes (Signed)
Pt ambulatory and independent at discharge.  Verbalized understanding of discharge instructions 

## 2016-09-26 NOTE — ED Provider Notes (Signed)
[facility]-EMERGENCY DEPT Provider Note   CSN: [id number] Arrival date & time: 09/25/16  1905     History   Chief Complaint Chief Complaint  Patient presents with  . Hand Injury    HPI Lisa Wheeler is a 66 y.o. female.  HPI Patient presents to the emergency department with an injury to the left hand.  Patient states she fell walking through a parking lot.  She states she put out her left hand and injured it.  She states that the main injury is to the left fifth digit.  He states that she has a wound to finger and applied pressure to stop the bleeding.  Patient states that nothing seems to make the condition better, movement and palpation makes the pain worse.  Patient states she did not have a syncopal episode.  She states she is not injured anywhere else other than the hand Past Medical History:  Diagnosis Date  . Elevated lipids   . Hypertension   . Osteopenia    right hip    Patient Active Problem List   Diagnosis Date Noted  . Essential hypertension 08/08/2015  . Osteoporosis 06/15/2014    Past Surgical History:  Procedure Laterality Date  . OOPHORECTOMY     right  . TUBAL LIGATION  1985  . WRIST SURGERY  2/12    OB History    Gravida Para Term Preterm AB Living   2 2       2    SAB TAB Ectopic Multiple Live Births                   Home Medications    Prior to Admission medications   Medication Sig Start Date End Date Taking? Authorizing Provider  Ascorbic Acid (VITAMIN C) 1000 MG tablet Take 1,000 mg by mouth daily.    Historical Provider, MD  AZOR 10-40 MG per tablet  03/19/13   Historical Provider, MD  BIOTIN PO Take by mouth daily.    Historical Provider, MD  CALCIUM PO Take 1,400 mg by mouth daily.    Historical Provider, MD  Cholecalciferol (VITAMIN D PO) Take 1,200 Int'l Units by mouth daily.     Historical Provider, MD  Glucosamine-Chondroit-Vit C-Mn (GLUCOSAMINE 1500 COMPLEX PO) Take by mouth daily.    Historical Provider, MD    Family  History Family History  Problem Relation Age of Onset  . Breast cancer Mother 5079  . Stomach cancer Father   . Heart disease Father     Social History Social History  Substance Use Topics  . Smoking status: Never Smoker  . Smokeless tobacco: Never Used  . Alcohol use No     Allergies   Mercury   Review of Systems Review of Systems All other systems negative except as documented in the HPI. All pertinent positives and negatives as reviewed in the HPI.  Physical Exam Updated Vital Signs BP 147/78 (BP Location: Right Arm)   Pulse 71   Temp 97.9 F (36.6 C) (Oral)   Resp 19   Ht 5\' 8"  (1.727 m)   Wt 79.4 kg   LMP 09/02/1998   SpO2 99%   BMI 26.61 kg/m   Physical Exam  Constitutional: She is oriented to person, place, and time. She appears well-developed and well-nourished.  HENT:  Head: Normocephalic and atraumatic.  Eyes: Pupils are equal, round, and reactive to light.  Pulmonary/Chest: Effort normal.  Musculoskeletal:       Left hand: She exhibits decreased range of  motion, tenderness, deformity, laceration and swelling. She exhibits normal two-point discrimination and normal capillary refill. Normal sensation noted. Decreased sensation is not present in the ulnar distribution and is not present in the radial distribution. Normal strength noted.       Hands: Neurological: She is alert and oriented to person, place, and time.  Skin: Skin is warm and dry. Capillary refill takes less than 2 seconds.     ED Treatments / Results  Labs (all labs ordered are listed, but only abnormal results are displayed) Labs Reviewed - No data to display  EKG  EKG Interpretation None       Radiology Dg Hand Complete Left  Result Date: 09/25/2016 CLINICAL DATA:  Acute onset of left hand pain and deformity after falling in parking lot. Initial encounter. EXAM: LEFT HAND - COMPLETE 3+ VIEW COMPARISON:  None. FINDINGS: There is palmar ulnar dislocation of the fifth proximal  interphalangeal joint, with shortening at the site of dislocation. There is also a mildly displaced fracture through the distal fifth metacarpal, with palmar and ulnar displacement. No intra-articular extension is seen. No additional fractures are seen. Minimal degenerative change is noted at the first carpometacarpal joint. Soft tissue swelling is noted about the fifth digit. IMPRESSION: 1. Palmar ulnar dislocation of the fifth proximal interphalangeal joint, with shortening at the site of dislocation. 2. Mildly displaced fracture through the distal fifth metacarpal, with palmar and ulnar displacement. No evidence of intra-articular extension. Electronically Signed   By: Roanna Raider M.D.   On: 09/25/2016 20:25    Procedures .Nerve Block Date/Time: 09/26/2016 12:48 AM Performed by: Charlestine Night Authorized by: Charlestine Night   Consent:    Consent obtained:  Verbal   Consent given by:  Patient   Risks discussed:  Allergic reaction, infection, nerve damage, swelling, unsuccessful block and pain   Alternatives discussed:  No treatment Indications:    Indications:  Pain relief Location:    Body area:  Upper extremity   Upper extremity nerve:  Metacarpal   Laterality:  Left Pre-procedure details:    Skin preparation:  Alcohol   Preparation: Patient was prepped and draped in usual sterile fashion   Skin anesthesia (see MAR for exact dosages):    Skin anesthesia method:  None Procedure details (see MAR for exact dosages):    Block needle gauge:  25 G   Anesthetic injected:  Bupivacaine 0.5% w/o epi   Steroid injected:  None   Additive injected:  None   Injection procedure:  Anatomic landmarks identified and introduced needle Post-procedure details:    Outcome:  Anesthesia achieved   Patient tolerance of procedure:  Tolerated well, no immediate complications Reduction of dislocation Date/Time: 09/26/2016 12:51 AM Performed by: Charlestine Night Authorized by: Charlestine Night  Consent: Verbal consent obtained. Risks and benefits: risks, benefits and alternatives were discussed Consent given by: patient Patient understanding: patient states understanding of the procedure being performed Patient consent: the patient's understanding of the procedure matches consent given Procedure consent: procedure consent matches procedure scheduled Relevant documents: relevant documents present and verified Imaging studies: imaging studies available Patient identity confirmed: verbally with patient and arm band Time out: Immediately prior to procedure a "time out" was called to verify the correct patient, procedure, equipment, support staff and site/side marked as required. Local anesthesia used: yes Anesthesia: hematoma block and digital block  Anesthesia: Local anesthesia used: yes Local Anesthetic: bupivacaine 0.5% without epinephrine  Sedation: Patient sedated: no Patient tolerance: Patient tolerated the procedure well with  no immediate complications  Reduction of fracture Date/Time: 09/26/2016 12:52 AM Performed by: Charlestine Night Authorized by: Charlestine Night  Consent: Verbal consent obtained. Risks and benefits: risks, benefits and alternatives were discussed Consent given by: patient Patient understanding: patient states understanding of the procedure being performed Patient consent: the patient's understanding of the procedure matches consent given Procedure consent: procedure consent matches procedure scheduled Relevant documents: relevant documents present and verified Required items: required blood products, implants, devices, and special equipment available Patient identity confirmed: verbally with patient and arm band Time out: Immediately prior to procedure a "time out" was called to verify the correct patient, procedure, equipment, support staff and site/side marked as required. Local anesthesia used: yes Anesthesia: hematoma  block  Anesthesia: Local anesthesia used: yes Local Anesthetic: bupivacaine 0.5% without epinephrine  Sedation: Patient sedated: no Patient tolerance: Patient tolerated the procedure well with no immediate complications Comments: Attempted reduction of fracture fragment at the interphalangeal joint.  This was difficult as the fracture fragment would not stay located.  When I applied the splint.  I attempted to reduce the fracture fragment.  As best I could    (including critical care time)  Medications Ordered in ED Medications  bupivacaine (MARCAINE) 0.5 % injection 50 mL (not administered)  ceFAZolin (ANCEF) IVPB 1 g/50 mL premix (1 g Intravenous New Bag/Given 09/25/16 2337)  Tdap (BOOSTRIX) injection 0.5 mL (0.5 mLs Intramuscular Given 09/25/16 2337)     Initial Impression / Assessment and Plan / ED Course  I have reviewed the triage vital signs and the nursing notes.  Pertinent labs & imaging results that were available during my care of the patient were reviewed by me and considered in my medical decision making (see chart for details).   I spoke with Dr. Mina Marble. The patient previously had surgery for her right wrist with him and he will see the patient in his office tomorrow.  He advised to loosely approximate the laceration.  We also gave IV antibiotics and updated her to shot.  The wound was copiously irrigated   LACERATION REPAIR Performed by: Carlyle Dolly Authorized by: Carlyle Dolly Consent: Verbal consent obtained. Risks and benefits: risks, benefits and alternatives were discussed Consent given by: patient Patient identity confirmed: provided demographic data Prepped and Draped in normal sterile fashion Wound explored  Laceration Location: PIP joint of the left fifth digit, palmar aspect  Laceration Length: 2 cm  No Foreign Bodies seen or palpated  Anesthesia: local infiltration  Local anesthetic: Marcaine 0.5% without epinephrine    Anesthetic total: Digital block   Irrigation method: syringe Amount of cleaning: standard  Skin closure: 2 loose simple interrupted sutures   Number of sutures: 2   Technique: Simple interrupted   Patient tolerance: Patient tolerated the procedure well with no immediate complications.   Final Clinical Impressions(s) / ED Diagnoses   Final diagnoses:  Fracture    New Prescriptions New Prescriptions   No medications on file     Charlestine Night, PA-C 09/26/16 0053    Lyndal Pulley, MD 09/26/16 0400    Lyndal Pulley, MD 09/26/16 [phone]

## 2017-06-12 ENCOUNTER — Other Ambulatory Visit: Payer: Self-pay | Admitting: Obstetrics & Gynecology

## 2017-06-12 DIAGNOSIS — Z1231 Encounter for screening mammogram for malignant neoplasm of breast: Secondary | ICD-10-CM

## 2017-08-04 ENCOUNTER — Ambulatory Visit
Admission: RE | Admit: 2017-08-04 | Discharge: 2017-08-04 | Disposition: A | Discharge status: Home / Self Care | Payer: Medicare Other | Source: Ambulatory Visit | Attending: Obstetrics & Gynecology | Admitting: Obstetrics & Gynecology

## 2017-08-04 DIAGNOSIS — Z1231 Encounter for screening mammogram for malignant neoplasm of breast: Secondary | ICD-10-CM

## 2018-02-17 ENCOUNTER — Encounter (HOSPITAL_COMMUNITY)
Admission: RE | Admit: 2018-02-17 | Discharge: 2018-02-17 | Disposition: A | Discharge status: Home / Self Care | Payer: Medicare Other | Source: Ambulatory Visit | Attending: Orthopedic Surgery | Admitting: Orthopedic Surgery

## 2018-02-17 ENCOUNTER — Encounter (HOSPITAL_COMMUNITY): Payer: Self-pay

## 2018-02-17 ENCOUNTER — Other Ambulatory Visit: Payer: Self-pay

## 2018-02-17 ENCOUNTER — Other Ambulatory Visit: Payer: Self-pay | Admitting: Orthopedic Surgery

## 2018-02-17 DIAGNOSIS — Z01810 Encounter for preprocedural cardiovascular examination: Secondary | ICD-10-CM | POA: Insufficient documentation

## 2018-02-17 DIAGNOSIS — Z01812 Encounter for preprocedural laboratory examination: Secondary | ICD-10-CM | POA: Insufficient documentation

## 2018-02-17 HISTORY — DX: Unilateral primary osteoarthritis, right knee: M17.11

## 2018-02-17 HISTORY — DX: Unspecified osteoarthritis, unspecified site: M19.90

## 2018-02-17 LAB — CBC WITH DIFFERENTIAL/PLATELET
ABS IMMATURE GRANULOCYTES: 0 10*3/uL (ref 0.0–0.1)
BASOS ABS: 0 10*3/uL (ref 0.0–0.1)
Basophils Relative: 0 %
Eosinophils Absolute: 0 10*3/uL (ref 0.0–0.7)
Eosinophils Relative: 1 %
HEMATOCRIT: 42.9 % (ref 36.0–46.0)
HEMOGLOBIN: 14.2 g/dL (ref 12.0–15.0)
Immature Granulocytes: 0 %
LYMPHS ABS: 1.9 10*3/uL (ref 0.7–4.0)
LYMPHS PCT: 28 %
MCH: 28.3 pg (ref 26.0–34.0)
MCHC: 33.1 g/dL (ref 30.0–36.0)
MCV: 85.6 fL (ref 78.0–100.0)
MONO ABS: 0.6 10*3/uL (ref 0.1–1.0)
MONOS PCT: 9 %
NEUTROS ABS: 4.2 10*3/uL (ref 1.7–7.7)
Neutrophils Relative %: 62 %
Platelets: 220 10*3/uL (ref 150–400)
RBC: 5.01 MIL/uL (ref 3.87–5.11)
RDW: 12.4 % (ref 11.5–15.5)
WBC: 6.9 10*3/uL (ref 4.0–10.5)

## 2018-02-17 LAB — SURGICAL PCR SCREEN
MRSA, PCR: NEGATIVE
STAPHYLOCOCCUS AUREUS: POSITIVE — AB

## 2018-02-17 LAB — COMPREHENSIVE METABOLIC PANEL
ALK PHOS: 54 U/L (ref 38–126)
ALT: 16 U/L (ref 14–54)
AST: 18 U/L (ref 15–41)
Albumin: 4.2 g/dL (ref 3.5–5.0)
Anion gap: 9 (ref 5–15)
BUN: 9 mg/dL (ref 6–20)
CALCIUM: 9.1 mg/dL (ref 8.9–10.3)
CO2: 23 mmol/L (ref 22–32)
CREATININE: 0.95 mg/dL (ref 0.44–1.00)
Chloride: 100 mmol/L — ABNORMAL LOW (ref 101–111)
GFR calc non Af Amer: 60 mL/min — ABNORMAL LOW (ref >60)
Glucose, Bld: 107 mg/dL — ABNORMAL HIGH (ref 65–99)
Potassium: 4.5 mmol/L (ref 3.5–5.1)
SODIUM: 132 mmol/L — AB (ref 135–145)
Total Bilirubin: 0.8 mg/dL (ref 0.3–1.2)
Total Protein: 7 g/dL (ref 6.5–8.1)

## 2018-02-17 NOTE — Progress Notes (Signed)
Pt made aware of pcr result positive for Staph. [facility]

## 2018-02-17 NOTE — Pre-Procedure Instructions (Signed)
Carlyn ReichertDeborah L Kirwan  02/17/2018      [phone] [place] - [address] [facility] [address] [place] [id number] Phone: [phone] Fax: [phone]    Your procedure is scheduled on Mon., February 23, 2018 from 8:15AM-9:30AM  Report to [facility] Admitting Entrance "A" at 6:15AM  Call this number if you have problems the morning of surgery:  [phone]   Remember:  Do not eat or drink after midnight on June 23rd    Take these medicines the morning of surgery with A SIP OF WATER: If needed Acetaminophen (TYLENOL)  As of today, stop taking all Other Aspirin Products, Vitamins, Fish oils, and Herbal medications. Also stop all NSAIDS i.e. Advil, Ibuprofen, Motrin, Aleve, Anaprox, Naproxen, BC, Goody Powders, and all Supplements.    Do not wear jewelry, make-up or nail polish.  Do not wear lotions, powders, or perfumes, or deodorant.  Do not shave 48 hours prior to surgery.   Do not bring valuables to the hospital.  [facility] is not responsible for any belongings or valuables.  Contacts, dentures or bridgework may not be worn into surgery.  Leave your suitcase in the car.  After surgery it may be brought to your room.  For patients admitted to the hospital, discharge time will be determined by your treatment team.  Patients discharged the day of surgery will not be allowed to drive home.   Special instructions:  [facility]- Preparing For Surgery  Before surgery, you can play an important role. Because skin is not sterile, your skin needs to be as free of germs as possible. You can reduce the number of germs on your skin by washing with CHG (chlorahexidine gluconate) Soap before surgery.  CHG is an antiseptic cleaner which kills germs and bonds with the skin to continue killing germs even after washing.    Oral Hygiene is also important to reduce your risk of infection.  Remember - BRUSH YOUR TEETH THE  MORNING OF SURGERY WITH YOUR REGULAR TOOTHPASTE  Please do not use if you have an allergy to CHG or antibacterial soaps. If your skin becomes reddened/irritated stop using the CHG.  Do not shave (including legs and underarms) for at least 48 hours prior to first CHG shower. It is OK to shave your face.  Please follow these instructions carefully.   1. Shower the NIGHT BEFORE SURGERY and the MORNING OF SURGERY with CHG.   2. If you chose to wash your hair, wash your hair first as usual with your normal shampoo.  3. After you shampoo, rinse your hair and body thoroughly to remove the shampoo.  4. Use CHG as you would any other liquid soap. You can apply CHG directly to the skin and wash gently with a scrungie or a clean washcloth.   5. Apply the CHG Soap to your body ONLY FROM THE NECK DOWN.  Do not use on open wounds or open sores. Avoid contact with your eyes, ears, mouth and genitals (private parts). Wash Face and genitals (private parts)  with your normal soap.  6. Wash thoroughly, paying special attention to the area where your surgery will be performed.  7. Thoroughly rinse your body with warm water from the neck down.  8. DO NOT shower/wash with your normal soap after using and rinsing off the CHG Soap.  9. Pat yourself dry with a CLEAN TOWEL.  10. Wear CLEAN  PAJAMAS to bed the night before surgery, wear comfortable clothes the morning of surgery  11. Place CLEAN SHEETS on your bed the night of your first shower and DO NOT SLEEP WITH PETS.  Day of Surgery:  Do not apply any deodorants/lotions.  Please wear clean clothes to the hospital/surgery center.   Remember to brush your teeth WITH YOUR REGULAR TOOTHPASTE.  Please read over the following fact sheets that you were given. Pain Booklet, Coughing and Deep Breathing, MRSA Information and Surgical Site Infection Prevention

## 2018-02-17 NOTE — Progress Notes (Signed)
[facility]  Cardiologist - Denies  Chest x-ray - Denies  EKG - 02/17/18  Stress Test - Denies  ECHO - Denies  Cardiac Cath - Denies  Sleep Study - Denies CPAP - None  LABS- 02/17/18: CBC w/D, CMP  ASA- Denies  Pt given the dressing that will be used after her surgery to test to make sure she will not have an allergic reaction to it.  Anesthesia- No  Pt denies having chest pain, sob, or fever at this time. All instructions explained to the pt, with a verbal understanding of the material. Pt agrees to go over the instructions while at home for a better understanding. The opportunity to ask questions was provided.

## 2018-02-20 MED ORDER — TRANEXAMIC ACID 1000 MG/10ML IV SOLN
1000.0000 mg | INTRAVENOUS | Status: AC
  Administered 2018-02-23: 1000 mg via INTRAVENOUS
  Filled 2018-02-20: qty 1100

## 2018-02-20 MED ORDER — BUPIVACAINE LIPOSOME 1.3 % IJ SUSP
20.0000 mL | Freq: Once | INTRAMUSCULAR | Status: DC
  Filled 2018-02-20: qty 20

## 2018-02-22 ENCOUNTER — Encounter (HOSPITAL_COMMUNITY): Payer: Self-pay | Admitting: Anesthesiology

## 2018-02-22 NOTE — Anesthesia Preprocedure Evaluation (Addendum)
Anesthesia Evaluation  Patient identified by MRN, date of birth, ID band Patient awake    Reviewed: Allergy & Precautions, NPO status , Patient's Chart, lab work & pertinent test results  Airway Mallampati: II  TM Distance: >3 FB Neck ROM: Full    Dental  (+) Teeth Intact, Dental Advisory Given   Pulmonary neg pulmonary ROS,    breath sounds clear to auscultation       Cardiovascular hypertension,  Rhythm:Regular Rate:Bradycardia     Neuro/Psych negative neurological ROS  negative psych ROS   GI/Hepatic negative GI ROS, Neg liver ROS,   Endo/Other  negative endocrine ROS  Renal/GU negative Renal ROS  negative genitourinary   Musculoskeletal  (+) Arthritis ,   Abdominal Normal abdominal exam  (+)   Peds  Hematology negative hematology ROS (+)   Anesthesia Other Findings   Reproductive/Obstetrics                           Lab Results  Component Value Date   WBC 6.9 02/17/2018   HGB 14.2 02/17/2018   HCT 42.9 02/17/2018   MCV 85.6 02/17/2018   PLT 220 02/17/2018   Lab Results  Component Value Date   CREATININE 0.95 02/17/2018   BUN 9 02/17/2018   NA 132 (L) 02/17/2018   K 4.5 02/17/2018   CL 100 (L) 02/17/2018   CO2 23 02/17/2018   No results found for: INR, PROTIME  EKG: NSR  Anesthesia Physical Anesthesia Plan  ASA: II  Anesthesia Plan: Spinal   Post-op Pain Management:  Regional for Post-op pain   Induction: Intravenous  PONV Risk Score and Plan: 3 and Ondansetron, Dexamethasone and Midazolam  Airway Management Planned: Simple Face Mask  Additional Equipment: None  Intra-op Plan:   Post-operative Plan:   Informed Consent: I have reviewed the patients History and Physical, chart, labs and discussed the procedure including the risks, benefits and alternatives for the proposed anesthesia with the patient or authorized representative who has indicated his/her  understanding and acceptance.   Dental advisory given  Plan Discussed with: CRNA  Anesthesia Plan Comments:        Anesthesia Quick Evaluation

## 2018-02-23 ENCOUNTER — Encounter (HOSPITAL_COMMUNITY)
Admission: RE | Disposition: A | Discharge status: Home / Self Care | Payer: Self-pay | Source: Ambulatory Visit | Attending: Orthopedic Surgery

## 2018-02-23 ENCOUNTER — Encounter (HOSPITAL_COMMUNITY): Payer: Self-pay

## 2018-02-23 ENCOUNTER — Observation Stay (HOSPITAL_COMMUNITY)
Admission: RE | Admit: 2018-02-23 | Discharge: 2018-02-24 | Disposition: A | Discharge status: Home / Self Care | Payer: Medicare Other | Source: Ambulatory Visit | Attending: Orthopedic Surgery | Admitting: Orthopedic Surgery

## 2018-02-23 ENCOUNTER — Ambulatory Visit (HOSPITAL_COMMUNITY): Payer: Medicare Other | Admitting: Anesthesiology

## 2018-02-23 DIAGNOSIS — I10 Essential (primary) hypertension: Secondary | ICD-10-CM | POA: Diagnosis not present

## 2018-02-23 DIAGNOSIS — M8588 Other specified disorders of bone density and structure, other site: Secondary | ICD-10-CM | POA: Diagnosis not present

## 2018-02-23 DIAGNOSIS — R001 Bradycardia, unspecified: Secondary | ICD-10-CM | POA: Diagnosis not present

## 2018-02-23 DIAGNOSIS — Z888 Allergy status to other drugs, medicaments and biological substances status: Secondary | ICD-10-CM | POA: Diagnosis not present

## 2018-02-23 DIAGNOSIS — Z96651 Presence of right artificial knee joint: Secondary | ICD-10-CM

## 2018-02-23 DIAGNOSIS — Z79899 Other long term (current) drug therapy: Secondary | ICD-10-CM | POA: Diagnosis not present

## 2018-02-23 DIAGNOSIS — M1711 Unilateral primary osteoarthritis, right knee: Secondary | ICD-10-CM | POA: Diagnosis present

## 2018-02-23 DIAGNOSIS — Z7982 Long term (current) use of aspirin: Secondary | ICD-10-CM | POA: Diagnosis not present

## 2018-02-23 DIAGNOSIS — Z96659 Presence of unspecified artificial knee joint: Secondary | ICD-10-CM

## 2018-02-23 HISTORY — PX: TOTAL KNEE ARTHROPLASTY: SHX125

## 2018-02-23 SURGERY — ARTHROPLASTY, KNEE, TOTAL
Anesthesia: Spinal | Laterality: Right

## 2018-02-23 MED ORDER — AMLODIPINE-OLMESARTAN 10-40 MG PO TABS: 1.0000 | ORAL_TABLET | Freq: Every day | ORAL | Status: DC

## 2018-02-23 MED ORDER — SENNOSIDES-DOCUSATE SODIUM 8.6-50 MG PO TABS
1.0000 | ORAL_TABLET | Freq: Every evening | ORAL | Status: DC | PRN
Start: 2018-02-23 — End: 2018-02-24

## 2018-02-23 MED ORDER — GABAPENTIN 300 MG PO CAPS
300.0000 mg | ORAL_CAPSULE | Freq: Once | ORAL | Status: AC
  Administered 2018-02-23: 300 mg via ORAL

## 2018-02-23 MED ORDER — DIPHENHYDRAMINE HCL 12.5 MG/5ML PO ELIX: 12.5000 mg | ORAL_SOLUTION | ORAL | Status: DC | PRN

## 2018-02-23 MED ORDER — ACETAMINOPHEN 500 MG PO TABS
ORAL_TABLET | ORAL | Status: AC
Start: 2018-02-23 — End: 2018-02-23
  Administered 2018-02-23: 1000 mg via ORAL
  Filled 2018-02-23: qty 2

## 2018-02-23 MED ORDER — SODIUM CHLORIDE 0.9 % IJ SOLN
INTRAMUSCULAR | Status: DC | PRN
  Administered 2018-02-23: 20 mL via INTRAVENOUS

## 2018-02-23 MED ORDER — PROPOFOL 500 MG/50ML IV EMUL
INTRAVENOUS | Status: DC | PRN
  Administered 2018-02-23: 100 ug/kg/min via INTRAVENOUS

## 2018-02-23 MED ORDER — SODIUM CHLORIDE 0.9 % IR SOLN
Status: DC | PRN
  Administered 2018-02-23: 3000 mL

## 2018-02-23 MED ORDER — BUPIVACAINE HCL (PF) 0.25 % IJ SOLN
INTRAMUSCULAR | Status: AC
  Filled 2018-02-23: qty 30

## 2018-02-23 MED ORDER — METOCLOPRAMIDE HCL 5 MG/ML IJ SOLN: 5.0000 mg | Freq: Three times a day (TID) | INTRAMUSCULAR | Status: DC | PRN

## 2018-02-23 MED ORDER — GABAPENTIN 300 MG PO CAPS
ORAL_CAPSULE | ORAL | Status: AC
  Administered 2018-02-23: 300 mg via ORAL
  Filled 2018-02-23: qty 1

## 2018-02-23 MED ORDER — 0.9 % SODIUM CHLORIDE (POUR BTL) OPTIME
TOPICAL | Status: DC | PRN
  Administered 2018-02-23: 1000 mL

## 2018-02-23 MED ORDER — FENTANYL CITRATE (PF) 250 MCG/5ML IJ SOLN
INTRAMUSCULAR | Status: AC
  Filled 2018-02-23: qty 5

## 2018-02-23 MED ORDER — LACTATED RINGERS IV SOLN
INTRAVENOUS | Status: DC | PRN
  Administered 2018-02-23 (×2): via INTRAVENOUS

## 2018-02-23 MED ORDER — MIDAZOLAM HCL 2 MG/2ML IJ SOLN
INTRAMUSCULAR | Status: DC | PRN
  Administered 2018-02-23: 0.5 mg via INTRAVENOUS
  Administered 2018-02-23: 1 mg via INTRAVENOUS
  Administered 2018-02-23: 0.5 mg via INTRAVENOUS

## 2018-02-23 MED ORDER — IRBESARTAN 300 MG PO TABS
300.0000 mg | ORAL_TABLET | Freq: Every day | ORAL | Status: DC
  Administered 2018-02-23: 300 mg via ORAL
  Filled 2018-02-23 (×2): qty 1

## 2018-02-23 MED ORDER — ASPIRIN EC 325 MG PO TBEC
325.0000 mg | DELAYED_RELEASE_TABLET | Freq: Two times a day (BID) | ORAL | Status: DC
  Administered 2018-02-23 – 2018-02-24 (×2): 325 mg via ORAL
  Filled 2018-02-23 (×2): qty 1

## 2018-02-23 MED ORDER — TRAMADOL HCL 50 MG PO TABS
50.0000 mg | ORAL_TABLET | Freq: Four times a day (QID) | ORAL | Status: DC
  Administered 2018-02-23 – 2018-02-24 (×4): 50 mg via ORAL
  Filled 2018-02-23 (×4): qty 1

## 2018-02-23 MED ORDER — ONDANSETRON HCL 4 MG/2ML IJ SOLN
INTRAMUSCULAR | Status: AC
  Filled 2018-02-23: qty 2

## 2018-02-23 MED ORDER — MIDAZOLAM HCL 2 MG/2ML IJ SOLN
INTRAMUSCULAR | Status: AC
  Filled 2018-02-23: qty 2

## 2018-02-23 MED ORDER — PHENYLEPHRINE HCL 10 MG/ML IJ SOLN
INTRAVENOUS | Status: DC | PRN
  Administered 2018-02-23: 20 ug/min via INTRAVENOUS

## 2018-02-23 MED ORDER — ONDANSETRON HCL 4 MG/2ML IJ SOLN
INTRAMUSCULAR | Status: DC | PRN
  Administered 2018-02-23: 4 mg via INTRAVENOUS

## 2018-02-23 MED ORDER — METOCLOPRAMIDE HCL 5 MG PO TABS: 5.0000 mg | ORAL_TABLET | Freq: Three times a day (TID) | ORAL | Status: DC | PRN

## 2018-02-23 MED ORDER — ONDANSETRON HCL 4 MG PO TABS: 4.0000 mg | ORAL_TABLET | Freq: Four times a day (QID) | ORAL | Status: DC | PRN

## 2018-02-23 MED ORDER — HYDROMORPHONE HCL 1 MG/ML IJ SOLN: 0.2500 mg | INTRAMUSCULAR | Status: DC | PRN

## 2018-02-23 MED ORDER — LACTATED RINGERS IV SOLN: INTRAVENOUS | Status: DC

## 2018-02-23 MED ORDER — METHOCARBAMOL 1000 MG/10ML IJ SOLN
500.0000 mg | Freq: Four times a day (QID) | INTRAVENOUS | Status: DC | PRN
  Filled 2018-02-23: qty 5

## 2018-02-23 MED ORDER — BUPIVACAINE-EPINEPHRINE (PF) 0.25% -1:200000 IJ SOLN
INTRAMUSCULAR | Status: DC | PRN
  Administered 2018-02-23: 30 mL via PERINEURAL

## 2018-02-23 MED ORDER — PROMETHAZINE HCL 25 MG/ML IJ SOLN: 6.2500 mg | INTRAMUSCULAR | Status: DC | PRN

## 2018-02-23 MED ORDER — CEFAZOLIN SODIUM-DEXTROSE 2-4 GM/100ML-% IV SOLN
2.0000 g | INTRAVENOUS | Status: AC
  Administered 2018-02-23: 2 g via INTRAVENOUS

## 2018-02-23 MED ORDER — ACETAMINOPHEN 500 MG PO TABS
1000.0000 mg | ORAL_TABLET | Freq: Four times a day (QID) | ORAL | Status: AC
  Administered 2018-02-23 – 2018-02-24 (×4): 1000 mg via ORAL
  Filled 2018-02-23 (×4): qty 2

## 2018-02-23 MED ORDER — OXYCODONE HCL 5 MG PO TABS: 5.0000 mg | ORAL_TABLET | ORAL | Status: DC | PRN

## 2018-02-23 MED ORDER — DEXAMETHASONE SODIUM PHOSPHATE 10 MG/ML IJ SOLN
INTRAMUSCULAR | Status: AC
  Filled 2018-02-23: qty 1

## 2018-02-23 MED ORDER — PHENOL 1.4 % MT LIQD: 1.0000 | OROMUCOSAL | Status: DC | PRN

## 2018-02-23 MED ORDER — PANTOPRAZOLE SODIUM 40 MG PO TBEC
40.0000 mg | DELAYED_RELEASE_TABLET | Freq: Every day | ORAL | Status: DC
  Administered 2018-02-23 – 2018-02-24 (×2): 40 mg via ORAL
  Filled 2018-02-23 (×2): qty 1

## 2018-02-23 MED ORDER — CHLORHEXIDINE GLUCONATE 4 % EX LIQD: 60.0000 mL | Freq: Once | CUTANEOUS | Status: DC

## 2018-02-23 MED ORDER — MEPERIDINE HCL 50 MG/ML IJ SOLN: 6.2500 mg | INTRAMUSCULAR | Status: DC | PRN

## 2018-02-23 MED ORDER — BISACODYL 5 MG PO TBEC: 5.0000 mg | DELAYED_RELEASE_TABLET | Freq: Every day | ORAL | Status: DC | PRN

## 2018-02-23 MED ORDER — CEFAZOLIN SODIUM-DEXTROSE 2-4 GM/100ML-% IV SOLN
INTRAVENOUS | Status: AC
  Filled 2018-02-23: qty 100

## 2018-02-23 MED ORDER — PHENYLEPHRINE 40 MCG/ML (10ML) SYRINGE FOR IV PUSH (FOR BLOOD PRESSURE SUPPORT)
PREFILLED_SYRINGE | INTRAVENOUS | Status: DC | PRN
  Administered 2018-02-23 (×2): 80 ug via INTRAVENOUS
  Administered 2018-02-23 (×2): 40 ug via INTRAVENOUS
  Administered 2018-02-23: 80 ug via INTRAVENOUS
  Administered 2018-02-23: 120 ug via INTRAVENOUS

## 2018-02-23 MED ORDER — LIDOCAINE 2% (20 MG/ML) 5 ML SYRINGE
INTRAMUSCULAR | Status: AC
  Filled 2018-02-23: qty 5

## 2018-02-23 MED ORDER — FENTANYL CITRATE (PF) 250 MCG/5ML IJ SOLN
INTRAMUSCULAR | Status: DC | PRN
  Administered 2018-02-23 (×2): 50 ug via INTRAVENOUS

## 2018-02-23 MED ORDER — ZOLPIDEM TARTRATE 5 MG PO TABS: 5.0000 mg | ORAL_TABLET | Freq: Every evening | ORAL | Status: DC | PRN

## 2018-02-23 MED ORDER — HYDROMORPHONE HCL 2 MG/ML IJ SOLN: 0.5000 mg | INTRAMUSCULAR | Status: DC | PRN

## 2018-02-23 MED ORDER — ACETAMINOPHEN 500 MG PO TABS
1000.0000 mg | ORAL_TABLET | Freq: Once | ORAL | Status: AC
  Administered 2018-02-23: 1000 mg via ORAL

## 2018-02-23 MED ORDER — METHOCARBAMOL 500 MG PO TABS: 500.0000 mg | ORAL_TABLET | Freq: Four times a day (QID) | ORAL | Status: DC | PRN

## 2018-02-23 MED ORDER — ONDANSETRON HCL 4 MG/2ML IJ SOLN
4.0000 mg | Freq: Four times a day (QID) | INTRAMUSCULAR | Status: DC | PRN
Start: 2018-02-23 — End: 2018-02-24

## 2018-02-23 MED ORDER — DEXAMETHASONE SODIUM PHOSPHATE 10 MG/ML IJ SOLN: 8.0000 mg | Freq: Once | INTRAMUSCULAR | Status: DC

## 2018-02-23 MED ORDER — DEXAMETHASONE SODIUM PHOSPHATE 10 MG/ML IJ SOLN
INTRAMUSCULAR | Status: DC | PRN
  Administered 2018-02-23: 10 mg via INTRAVENOUS

## 2018-02-23 MED ORDER — PROPOFOL 10 MG/ML IV BOLUS
INTRAVENOUS | Status: AC
  Filled 2018-02-23: qty 20

## 2018-02-23 MED ORDER — DEXAMETHASONE SODIUM PHOSPHATE 10 MG/ML IJ SOLN
10.0000 mg | Freq: Once | INTRAMUSCULAR | Status: AC
  Administered 2018-02-23: 10 mg via INTRAVENOUS
  Filled 2018-02-23: qty 1

## 2018-02-23 MED ORDER — TRANEXAMIC ACID 1000 MG/10ML IV SOLN
1000.0000 mg | Freq: Once | INTRAVENOUS | Status: DC
  Filled 2018-02-23: qty 10

## 2018-02-23 MED ORDER — BUPIVACAINE LIPOSOME 1.3 % IJ SUSP
INTRAMUSCULAR | Status: DC | PRN
  Administered 2018-02-23: 20 mL

## 2018-02-23 MED ORDER — FLEET ENEMA 7-19 GM/118ML RE ENEM: 1.0000 | ENEMA | Freq: Once | RECTAL | Status: DC | PRN

## 2018-02-23 MED ORDER — BUPIVACAINE IN DEXTROSE 0.75-8.25 % IT SOLN
INTRATHECAL | Status: DC | PRN
  Administered 2018-02-23: 2 mL via INTRATHECAL

## 2018-02-23 MED ORDER — MENTHOL 3 MG MT LOZG: 1.0000 | LOZENGE | OROMUCOSAL | Status: DC | PRN

## 2018-02-23 MED ORDER — CEFAZOLIN SODIUM-DEXTROSE 2-4 GM/100ML-% IV SOLN
2.0000 g | Freq: Four times a day (QID) | INTRAVENOUS | Status: AC
  Administered 2018-02-23 (×2): 2 g via INTRAVENOUS
  Filled 2018-02-23 (×2): qty 100

## 2018-02-23 MED ORDER — ALUM & MAG HYDROXIDE-SIMETH 200-200-20 MG/5ML PO SUSP: 30.0000 mL | ORAL | Status: DC | PRN

## 2018-02-23 MED ORDER — PHENYLEPHRINE 40 MCG/ML (10ML) SYRINGE FOR IV PUSH (FOR BLOOD PRESSURE SUPPORT)
PREFILLED_SYRINGE | INTRAVENOUS | Status: AC
  Filled 2018-02-23: qty 10

## 2018-02-23 MED ORDER — GABAPENTIN 300 MG PO CAPS
300.0000 mg | ORAL_CAPSULE | Freq: Three times a day (TID) | ORAL | Status: DC
  Administered 2018-02-23 – 2018-02-24 (×4): 300 mg via ORAL
  Filled 2018-02-23 (×3): qty 1

## 2018-02-23 MED ORDER — ROPIVACAINE HCL 5 MG/ML IJ SOLN
INTRAMUSCULAR | Status: DC | PRN
  Administered 2018-02-23: 30 mL via PERINEURAL

## 2018-02-23 MED ORDER — DOCUSATE SODIUM 100 MG PO CAPS
100.0000 mg | ORAL_CAPSULE | Freq: Two times a day (BID) | ORAL | Status: DC
  Administered 2018-02-23: 100 mg via ORAL
  Filled 2018-02-23 (×2): qty 1

## 2018-02-23 MED ORDER — AMLODIPINE BESYLATE 10 MG PO TABS
10.0000 mg | ORAL_TABLET | Freq: Every day | ORAL | Status: DC
  Filled 2018-02-23 (×2): qty 1

## 2018-02-23 MED ORDER — FERROUS SULFATE 325 (65 FE) MG PO TABS
325.0000 mg | ORAL_TABLET | Freq: Three times a day (TID) | ORAL | Status: DC
  Administered 2018-02-23 – 2018-02-24 (×3): 325 mg via ORAL
  Filled 2018-02-23 (×3): qty 1

## 2018-02-23 SURGICAL SUPPLY — 73 items
ADH SKN CLS APL DERMABOND .7 (GAUZE/BANDAGES/DRESSINGS) ×1
BANDAGE ACE 6X5 VEL STRL LF (GAUZE/BANDAGES/DRESSINGS) ×3 IMPLANT
BANDAGE ESMARK 6X9 LF (GAUZE/BANDAGES/DRESSINGS) ×1 IMPLANT
BLADE SAGITTAL 13X1.27X60 (BLADE) ×2 IMPLANT
BLADE SAGITTAL 13X1.27X60MM (BLADE) ×1
BLADE SAW SGTL 83.5X18.5 (BLADE) ×3 IMPLANT
BLADE SURG 10 STRL SS (BLADE) ×3 IMPLANT
BNDG CMPR 9X6 STRL LF SNTH (GAUZE/BANDAGES/DRESSINGS) ×1
BNDG ESMARK 6X9 LF (GAUZE/BANDAGES/DRESSINGS) ×3
BOWL SMART MIX CTS (DISPOSABLE) ×3 IMPLANT
BSPLAT TIB 5D E CMNT KN RT (Knees) ×1 IMPLANT
CEMENT BONE SIMPLEX SPEEDSET (Cement) ×6 IMPLANT
CLOSURE WOUND 1/2 X4 (GAUZE/BANDAGES/DRESSINGS) ×1
COMP FEM PERSONA SZ10 RT (Joint) ×3 IMPLANT
COMPONENT FEM PERSONA SZ10 RT (Joint) IMPLANT
COVER SURGICAL LIGHT HANDLE (MISCELLANEOUS) ×3 IMPLANT
CUFF TOURNIQUET SINGLE 34IN LL (TOURNIQUET CUFF) ×3 IMPLANT
DECANTER SPIKE VIAL GLASS SM (MISCELLANEOUS) ×2 IMPLANT
DERMABOND ADVANCED (GAUZE/BANDAGES/DRESSINGS) ×2
DERMABOND ADVANCED .7 DNX12 (GAUZE/BANDAGES/DRESSINGS) IMPLANT
DRAPE EXTREMITY T 121X128X90 (DRAPE) ×3 IMPLANT
DRAPE HALF SHEET 40X57 (DRAPES) ×3 IMPLANT
DRAPE INCISE IOBAN 66X45 STRL (DRAPES) ×6 IMPLANT
DRAPE U-SHAPE 47X51 STRL (DRAPES) ×3 IMPLANT
DRSG AQUACEL AG ADV 3.5X10 (GAUZE/BANDAGES/DRESSINGS) ×3 IMPLANT
DURAPREP 26ML APPLICATOR (WOUND CARE) ×6 IMPLANT
ELECT REM PT RETURN 9FT ADLT (ELECTROSURGICAL) ×3
ELECTRODE REM PT RTRN 9FT ADLT (ELECTROSURGICAL) ×1 IMPLANT
GLOVE BIOGEL M 7.0 STRL (GLOVE) IMPLANT
GLOVE BIOGEL PI IND STRL 7.5 (GLOVE) IMPLANT
GLOVE BIOGEL PI IND STRL 8.5 (GLOVE) ×1 IMPLANT
GLOVE BIOGEL PI INDICATOR 7.5 (GLOVE)
GLOVE BIOGEL PI INDICATOR 8.5 (GLOVE) ×2
GLOVE SURG ORTHO 8.0 STRL STRW (GLOVE) ×6 IMPLANT
GOWN STRL REUS W/ TWL LRG LVL3 (GOWN DISPOSABLE) ×1 IMPLANT
GOWN STRL REUS W/ TWL XL LVL3 (GOWN DISPOSABLE) ×2 IMPLANT
GOWN STRL REUS W/TWL 2XL LVL3 (GOWN DISPOSABLE) ×3 IMPLANT
GOWN STRL REUS W/TWL LRG LVL3 (GOWN DISPOSABLE) ×3
GOWN STRL REUS W/TWL XL LVL3 (GOWN DISPOSABLE) ×6
HANDPIECE INTERPULSE COAX TIP (DISPOSABLE) ×3
HOOD PEEL AWAY FACE SHEILD DIS (HOOD) ×9 IMPLANT
INSERT TIBIAL PLY R EF10-11X11 (Insert) ×3 IMPLANT
KIT BASIN OR (CUSTOM PROCEDURE TRAY) ×3 IMPLANT
KIT TURNOVER KIT B (KITS) ×3 IMPLANT
MANIFOLD NEPTUNE II (INSTRUMENTS) ×3 IMPLANT
NDL 18GX1X1/2 (RX/OR ONLY) (NEEDLE) IMPLANT
NEEDLE 18GX1X1/2 (RX/OR ONLY) (NEEDLE) IMPLANT
NEEDLE 22X1 1/2 (OR ONLY) (NEEDLE) ×6 IMPLANT
NS IRRIG 1000ML POUR BTL (IV SOLUTION) ×3 IMPLANT
PACK TOTAL JOINT (CUSTOM PROCEDURE TRAY) ×3 IMPLANT
PAD ARMBOARD 7.5X6 YLW CONV (MISCELLANEOUS) ×6 IMPLANT
SET HNDPC FAN SPRY TIP SCT (DISPOSABLE) ×1 IMPLANT
SRFC ARTC 10-11 E-F 11XPOST (Insert) ×1 IMPLANT
STEM POLY PAT PLY 35M KNEE (Knees) ×2 IMPLANT
STEM TIBIA 5 DEG SZ E R KNEE (Knees) IMPLANT
STRIP CLOSURE SKIN 1/2X4 (GAUZE/BANDAGES/DRESSINGS) ×2 IMPLANT
SUCTION FRAZIER HANDLE 10FR (MISCELLANEOUS)
SUCTION TUBE FRAZIER 10FR DISP (MISCELLANEOUS) IMPLANT
SURFACE ARTC 10-11 E-F 11XPOST (Insert) IMPLANT
SUT BONE WAX W31G (SUTURE) ×3 IMPLANT
SUT MNCRL AB 3-0 PS2 18 (SUTURE) ×3 IMPLANT
SUT VIC AB 0 CTB1 27 (SUTURE) ×3 IMPLANT
SUT VIC AB 1 CT1 27 (SUTURE) ×6
SUT VIC AB 1 CT1 27XBRD ANBCTR (SUTURE) ×2 IMPLANT
SUT VIC AB 2-0 CT1 27 (SUTURE) ×6
SUT VIC AB 2-0 CT1 TAPERPNT 27 (SUTURE) ×2 IMPLANT
SUT VLOC 180 0 24IN GS25 (SUTURE) ×3 IMPLANT
SYR 20CC LL (SYRINGE) ×6 IMPLANT
TIBIA STEM 5 DEG SZ E R KNEE (Knees) ×3 IMPLANT
TOWEL OR 17X24 6PK STRL BLUE (TOWEL DISPOSABLE) ×3 IMPLANT
TOWEL OR 17X26 10 PK STRL BLUE (TOWEL DISPOSABLE) ×3 IMPLANT
TRAY CATH 16FR W/PLASTIC CATH (SET/KITS/TRAYS/PACK) IMPLANT
WRAP KNEE MAXI GEL POST OP (GAUZE/BANDAGES/DRESSINGS) ×3 IMPLANT

## 2018-02-23 NOTE — Anesthesia Procedure Notes (Signed)
Spinal  Patient location during procedure: OR Start time: 02/23/2018 7:35 AM End time: 02/23/2018 7:40 AM Staffing Anesthesiologist: Hollis, Kevin D, MD Performed: anesthesiologist  Preanesthetic Checklist Completed: patient identified, site marked, surgical consent, pre-op evaluation, timeout performed, IV checked, risks and benefits discussed and monitors and equipment checked Spinal Block Patient position: sitting Prep: DuraPrep Patient monitoring: heart rate, continuous pulse ox, blood pressure and cardiac monitor Approach: midline Location: L4-5 Injection technique: single-shot Needle Needle type: Introducer and Pencan  Needle gauge: 24 G Needle length: 9 cm Additional Notes Negative paresthesia. Negative blood return. Positive free-flowing CSF. Expiration date of kit checked and confirmed. Patient tolerated procedure well, without complications.       

## 2018-02-23 NOTE — Anesthesia Postprocedure Evaluation (Signed)
Anesthesia Post Note  Patient: Carlyn ReichertDeborah L Ellsworth  Procedure(s) Performed: TOTAL KNEE ARTHROPLASTY (Right )     Patient location during evaluation: PACU Anesthesia Type: Spinal Level of consciousness: oriented and awake and alert Pain management: pain level controlled Vital Signs Assessment: post-procedure vital signs reviewed and stable Respiratory status: spontaneous breathing, respiratory function stable and patient connected to nasal cannula oxygen Cardiovascular status: blood pressure returned to baseline and stable Postop Assessment: no headache, no backache, no apparent nausea or vomiting, spinal receding and patient able to bend at knees Anesthetic complications: no    Last Vitals:  Vitals:   02/23/18 1135 02/23/18 1156  BP: 135/75 139/85  Pulse: 64 70  Resp: 12 20  Temp: (!) 36.4 C 36.5 C  SpO2: 98% 97%    Last Pain:  Vitals:   02/23/18 1156  TempSrc: Oral  PainSc:                  Shelton SilvasKevin D Rim Thatch

## 2018-02-23 NOTE — Transfer of Care (Signed)
Immediate Anesthesia Transfer of Care Note  Patient: Lisa Wheeler  Procedure(s) Performed: TOTAL KNEE ARTHROPLASTY (Right )  Patient Location: PACU  Anesthesia Type:MAC, Regional and Spinal  Level of Consciousness: awake, alert  and oriented  Airway & Oxygen Therapy: Patient Spontanous Breathing  Post-op Assessment: Report given to RN and Post -op Vital signs reviewed and stable  Post vital signs: Reviewed and stable  Last Vitals:  Vitals Value Taken Time  BP 93/63 02/23/2018  9:21 AM  Temp    Pulse 61 02/23/2018  9:21 AM  Resp 10 02/23/2018  9:21 AM  SpO2 95 % 02/23/2018  9:21 AM  Vitals shown include unvalidated device data.  Last Pain:  Vitals:   02/23/18 0629  PainSc: 0-No pain         Complications: No apparent anesthesia complications

## 2018-02-23 NOTE — Plan of Care (Signed)
  Problem: Education: Goal: Knowledge of General Education information will improve Outcome: Progressing   

## 2018-02-23 NOTE — Evaluation (Signed)
Physical Therapy Evaluation Patient Details Name: Lisa Wheeler MRN: [id number] DOB: October 25, 1950 Today's Date: 02/23/2018   History of Present Illness  Pt is a 67 y/o female s/p elective R TKA. PMH includes HTN, osteopenia, R hand fracture surgery.   Clinical Impression  Pt is s/p surgery above with deficits below. Pt tolerated gait training well this session, requiring min guard A for mobility. Reviewed supine HEP and knee precautions with pt. Will continue to follow acutely to maximize functional mobility independence and safety.     Follow Up Recommendations Follow surgeon's recommendation for DC plan and follow-up therapies;Supervision for mobility/OOB    Equipment Recommendations  None recommended by PT(refusing 3 in 1)    Recommendations for Other Services       Precautions / Restrictions Precautions Precautions: Knee Precaution Booklet Issued: Yes (comment) Precaution Comments: Reviewed knee precautions and supine HEP.  Restrictions Weight Bearing Restrictions: Yes RLE Weight Bearing: Weight bearing as tolerated      Mobility  Bed Mobility Overal bed mobility: Needs Assistance Bed Mobility: Supine to Sit     Supine to sit: Supervision     General bed mobility comments: Supervision for safety.   Transfers Overall transfer level: Needs assistance Equipment used: Rolling walker (2 wheeled) Transfers: Sit to/from Stand Sit to Stand: Min assist         General transfer comment: Min A for lift assist and steadying. Verbal cues for safe hand placement.   Ambulation/Gait Ambulation/Gait assistance: Min guard Gait Distance (Feet): 15 Feet Assistive device: Rolling walker (2 wheeled) Gait Pattern/deviations: Step-to pattern;Decreased step length - right;Decreased step length - left;Decreased weight shift to right;Antalgic Gait velocity: Decreased    General Gait Details: Slow, antalgic gait. Overall steady using RW, requiring min guard A. Verbal cues for  sequencing using RW.   Stairs            Wheelchair Mobility    Modified Rankin (Stroke Patients Only)       Balance Overall balance assessment: Needs assistance Sitting-balance support: No upper extremity supported;Feet supported Sitting balance-Leahy Scale: Good     Standing balance support: Bilateral upper extremity supported;No upper extremity supported;During functional activity Standing balance-Leahy Scale: Fair Standing balance comment: Static standing without UE support to wash hands at sink.                              Pertinent Vitals/Pain Pain Assessment: 0-10 Pain Score: 1  Pain Location: R knee  Pain Descriptors / Indicators: Aching;Operative site guarding Pain Intervention(s): Monitored during session;Limited activity within patient's tolerance;Repositioned    Home Living Family/patient expects to be discharged to:: Private residence Living Arrangements: Spouse/significant other Available Help at Discharge: Family;Available 24 hours/day Type of Home: House Home Access: Stairs to enter Entrance Stairs-Rails: None Entrance Stairs-Number of Steps: 2 Home Layout: One level Home Equipment: Walker - 2 wheels;Other (comment)(CPM)      Prior Function Level of Independence: Independent               Hand Dominance        Extremity/Trunk Assessment   Upper Extremity Assessment Upper Extremity Assessment: Defer to OT evaluation    Lower Extremity Assessment Lower Extremity Assessment: RLE deficits/detail RLE Deficits / Details: Sensory in tact. Deficits consistent with post op pain and weakness. Able to perform ther ex below.     Cervical / Trunk Assessment Cervical / Trunk Assessment: Normal  Communication   Communication: No difficulties  Cognition Arousal/Alertness: Awake/alert Behavior During Therapy: WFL for tasks assessed/performed Overall Cognitive Status: Within Functional Limits for tasks assessed                                         General Comments      Exercises Total Joint Exercises Ankle Circles/Pumps: AROM;Both;20 reps Quad Sets: AROM;Right;10 reps Heel Slides: AROM;Right;10 reps   Assessment/Plan    PT Assessment Patient needs continued PT services  PT Problem List Decreased strength;Decreased activity tolerance;Decreased range of motion;Decreased balance;Decreased mobility;Decreased knowledge of use of DME;Decreased knowledge of precautions;Pain       PT Treatment Interventions DME instruction;Gait training;Stair training;Therapeutic activities;Functional mobility training;Patient/family education;Balance training;Therapeutic exercise    PT Goals (Current goals can be found in the Care Plan section)  Acute Rehab PT Goals Patient Stated Goal: To go home  PT Goal Formulation: With patient Time For Goal Achievement: 03/09/18 Potential to Achieve Goals: Good    Frequency 7X/week   Barriers to discharge        Co-evaluation               AM-PAC PT "6 Clicks" Daily Activity  Outcome Measure Difficulty turning over in bed (including adjusting bedclothes, sheets and blankets)?: None Difficulty moving from lying on back to sitting on the side of the bed? : A Little Difficulty sitting down on and standing up from a chair with arms (e.g., wheelchair, bedside commode, etc,.)?: Unable Help needed moving to and from a bed to chair (including a wheelchair)?: A Little Help needed walking in hospital room?: A Little Help needed climbing 3-5 steps with a railing? : A Lot 6 Click Score: 16    End of Session Equipment Utilized During Treatment: Gait belt Activity Tolerance: Patient tolerated treatment well Patient left: in chair;with call bell/phone within reach Nurse Communication: Mobility status PT Visit Diagnosis: Muscle weakness (generalized) (M62.81);Pain;Other abnormalities of gait and mobility (R26.89) Pain - Right/Left: Right Pain - part of body: Knee     Time: 1610-9604 PT Time Calculation (min) (ACUTE ONLY): 27 min   Charges:   PT Evaluation $PT Eval Low Complexity: 1 Low PT Treatments $Therapeutic Activity: 8-22 mins   PT G Codes:        Gladys Damme, PT, DPT  Acute Rehabilitation Services  Pager: [phone]   Lehman Prom 02/23/2018, 3:38 PM

## 2018-02-23 NOTE — Anesthesia Procedure Notes (Signed)
Procedure Name: MAC Date/Time: 02/23/2018 7:35 AM Performed by: Harden Mo, CRNA Pre-anesthesia Checklist: Patient identified, Emergency Drugs available, Suction available and Patient being monitored Patient Re-evaluated:Patient Re-evaluated prior to induction Oxygen Delivery Method: Simple face mask Preoxygenation: Pre-oxygenation with 100% oxygen Induction Type: IV induction Placement Confirmation: positive ETCO2 and breath sounds checked- equal and bilateral Dental Injury: Teeth and Oropharynx as per pre-operative assessment

## 2018-02-23 NOTE — H&P (Signed)
Lisa Wheeler MRN:  [id number] DOB/SEX:  1950-10-31/female  CHIEF COMPLAINT:  Painful right Knee  HISTORY: Patient is a 67 y.o. female presented with a history of pain in the right knee. Onset of symptoms was gradual starting a few years ago with gradually worsening course since that time. Patient has been treated conservatively with over-the-counter NSAIDs and activity modification. Patient currently rates pain in the knee at 10 out of 10 with activity. There is pain at night.  PAST MEDICAL HISTORY: Patient Active Problem List   Diagnosis Date Noted  . Essential hypertension 08/08/2015  . Osteoporosis 06/15/2014   Past Medical History:  Diagnosis Date  . Arthritis   . Elevated lipids   . Hypertension   . Osteoarthritis of right knee    Severe  . Osteopenia    right hip   Past Surgical History:  Procedure Laterality Date  . CLOSED REDUCTION HAND FRACTURE    . OOPHORECTOMY     right  . TUBAL LIGATION  1985  . WRIST SURGERY  2/12     MEDICATIONS:   Medications Prior to Admission  Medication Sig Dispense Refill Last Dose  . acetaminophen (TYLENOL) 500 MG tablet Take 1,000 mg by mouth daily as needed for moderate pain or headache.     . AZOR 10-40 MG per tablet Take 1 tablet by mouth daily.    Taking  . BIOTIN PO Take 2,000 mcg by mouth daily.    Taking  . Cholecalciferol (VITAMIN D PO) Take 1,200 Int'l Units by mouth daily.    Taking  . naproxen sodium (ALEVE) 220 MG tablet Take 220 mg by mouth 2 (two) times daily as needed (knee pain).     Ethelda Chick (OYSTER CALCIUM) 500 MG TABS tablet Take 500 mg of elemental calcium by mouth daily.     . vitamin C (ASCORBIC ACID) 500 MG tablet Take 500 mg by mouth daily.       ALLERGIES:   Allergies  Allergen Reactions  . Adhesive [Tape]     Redness, removes skin   . Mercury     Migraines/ headaches due to dental fillings     REVIEW OF SYSTEMS:  A comprehensive review of systems was negative except for: Musculoskeletal:  positive for arthralgias and bone pain   FAMILY HISTORY:   Family History  Problem Relation Age of Onset  . Breast cancer Mother 70  . Stomach cancer Father   . Heart disease Father     SOCIAL HISTORY:   Social History   Tobacco Use  . Smoking status: Never Smoker  . Smokeless tobacco: Never Used  Substance Use Topics  . Alcohol use: No     EXAMINATION:  Vital signs in last 24 hours:    LMP 09/02/1998   General Appearance:    Alert, cooperative, no distress, appears stated age  Head:    Normocephalic, without obvious abnormality, atraumatic  Eyes:    PERRL, conjunctiva/corneas clear, EOM's intact, fundi    benign, both eyes  Ears:    Normal TM's and external ear canals, both ears  Nose:   Nares normal, septum midline, mucosa normal, no drainage    or sinus tenderness  Throat:   Lips, mucosa, and tongue normal; teeth and gums normal  Neck:   Supple, symmetrical, trachea midline, no adenopathy;    thyroid:  no enlargement/tenderness/nodules; no carotid   bruit or JVD  Back:     Symmetric, no curvature, ROM normal, no CVA tenderness  Lungs:  Clear to auscultation bilaterally, respirations unlabored  Chest Wall:    No tenderness or deformity   Heart:    Regular rate and rhythm, S1 and S2 normal, no murmur, rub   or gallop  Breast Exam:    No tenderness, masses, or nipple abnormality  Abdomen:     Soft, non-tender, bowel sounds active all four quadrants,    no masses, no organomegaly  Genitalia:    Normal female without lesion, discharge or tenderness  Rectal:    Normal tone, no masses or tenderness;   guaiac negative stool  Extremities:   Extremities normal, atraumatic, no cyanosis or edema  Pulses:   2+ and symmetric all extremities  Skin:   Skin color, texture, turgor normal, no rashes or lesions  Lymph nodes:   Cervical, supraclavicular, and axillary nodes normal  Neurologic:   CNII-XII intact, normal strength, sensation and reflexes    throughout     Musculoskeletal:  ROM 0-120, Ligaments intact,  Imaging Review Plain radiographs demonstrate severe degenerative joint disease of the right knee. The overall alignment is neutral. The bone quality appears to be good for age and reported activity level.  Assessment/Plan: Primary osteoarthritis, right knee   The patient history, physical examination and imaging studies are consistent with advanced degenerative joint disease of the right knee. The patient has failed conservative treatment.  The clearance notes were reviewed.  After discussion with the patient it was felt that Total Knee Replacement was indicated. The procedure,  risks, and benefits of total knee arthroplasty were presented and reviewed. The risks including but not limited to aseptic loosening, infection, blood clots, vascular injury, stiffness, patella tracking problems complications among others were discussed. The patient acknowledged the explanation, agreed to proceed with the plan.  Preoperative templating of the joint replacement has been completed, documented, and submitted to the Operating Room personnel in order to optimize intra-operative equipment management.    Patient's anticipated LOS is less than 2 midnights, meeting these requirements: - Lives within 1 hour of care - Has a competent adult at home to recover with post-op recover - NO history of  - Chronic pain requiring opiods  - Diabetes  - Coronary Artery Disease  - Heart failure  - Heart attack  - Stroke  - DVT/VTE  - Cardiac arrhythmia  - Respiratory Failure/COPD  - Renal failure  - Anemia  - Advanced Liver disease       Guy SandiferColby Alan Robbins 02/23/2018, 6:17 AM

## 2018-02-23 NOTE — Progress Notes (Signed)
Orthopedic Tech Progress Note Patient Details:  Burney GauzeDeborah L Scheibel December 28, 1950 [id number]  CPM Right Knee CPM Right Knee: On Right Knee Flexion (Degrees): 90 Right Knee Extension (Degrees): 0 Additional Comments: foot roll  Post Interventions Patient Tolerated: Well Instructions Provided: Care of device, Adjustment of device  Saul FordyceJennifer C Kathalene Sporer 02/23/2018, 11:05 AM

## 2018-02-23 NOTE — Anesthesia Procedure Notes (Signed)
Anesthesia Regional Block: Adductor canal block   Pre-Anesthetic Checklist: ,, timeout performed, Correct Patient, Correct Site, Correct Laterality, Correct Procedure, Correct Position, site marked, Risks and benefits discussed,  Surgical consent,  Pre-op evaluation,  At surgeon's request and post-op pain management  Laterality: Right  Prep: chloraprep       Needles:  Injection technique: Single-shot  Needle Type: Echogenic Needle     Needle Length: 9cm  Needle Gauge: 21     Additional Needles:   Procedures:,,,, ultrasound used (permanent image in chart),,,,  Narrative:  Start time: 02/23/2018 7:08 AM End time: 02/23/2018 7:15 AM Injection made incrementally with aspirations every 5 mL.  Performed by: Personally  Anesthesiologist: Shelton SilvasHollis, Cherell Colvin D, MD  Additional Notes: Patient tolerated the procedure well. Local anesthetic introduced in an incremental fashion under minimal resistance after negative aspirations. No paresthesias were elicited. After completion of the procedure, no acute issues were identified and patient continued to be monitored by RN.

## 2018-02-24 ENCOUNTER — Encounter (HOSPITAL_COMMUNITY): Payer: Self-pay | Admitting: Orthopedic Surgery

## 2018-02-24 ENCOUNTER — Other Ambulatory Visit: Payer: Self-pay

## 2018-02-24 DIAGNOSIS — M1711 Unilateral primary osteoarthritis, right knee: Secondary | ICD-10-CM | POA: Diagnosis not present

## 2018-02-24 LAB — BASIC METABOLIC PANEL
ANION GAP: 7 (ref 5–15)
BUN: 13 mg/dL (ref 8–23)
CALCIUM: 8.8 mg/dL — AB (ref 8.9–10.3)
CHLORIDE: 104 mmol/L (ref 98–111)
CO2: 26 mmol/L (ref 22–32)
Creatinine, Ser: 1.05 mg/dL — ABNORMAL HIGH (ref 0.44–1.00)
GFR calc non Af Amer: 53 mL/min — ABNORMAL LOW (ref >60)
Glucose, Bld: 143 mg/dL — ABNORMAL HIGH (ref 70–99)
Potassium: 3.8 mmol/L (ref 3.5–5.1)
Sodium: 137 mmol/L (ref 135–145)

## 2018-02-24 LAB — CBC
HEMATOCRIT: 36.6 % (ref 36.0–46.0)
HEMOGLOBIN: 11.9 g/dL — AB (ref 12.0–15.0)
MCH: 28.5 pg (ref 26.0–34.0)
MCHC: 32.5 g/dL (ref 30.0–36.0)
MCV: 87.8 fL (ref 78.0–100.0)
Platelets: 200 10*3/uL (ref 150–400)
RBC: 4.17 MIL/uL (ref 3.87–5.11)
RDW: 12.5 % (ref 11.5–15.5)
WBC: 15.8 10*3/uL — AB (ref 4.0–10.5)

## 2018-02-24 MED ORDER — METHOCARBAMOL 500 MG PO TABS: 500.0000 mg | ORAL_TABLET | Freq: Four times a day (QID) | ORAL | 0 refills | Status: DC | PRN

## 2018-02-24 MED ORDER — OXYCODONE HCL 5 MG PO TABS: 5.0000 mg | ORAL_TABLET | Freq: Four times a day (QID) | ORAL | 0 refills | Status: DC | PRN

## 2018-02-24 MED ORDER — ASPIRIN 325 MG PO TBEC: 325.0000 mg | DELAYED_RELEASE_TABLET | Freq: Two times a day (BID) | ORAL | 0 refills | Status: DC

## 2018-02-24 NOTE — Progress Notes (Signed)
SPORTS MEDICINE AND JOINT REPLACEMENT  Lisa Spurling, MD    Lisa Nancy, PA-C [address] [place], [place], [place]  [address]                             [phone]   PROGRESS NOTE  Subjective:  negative for Chest Pain  negative for Shortness of Breath  negative for Nausea/Vomiting   negative for Calf Pain  negative for Bowel Movement   Tolerating Diet: yes         Patient reports pain as 4 on 0-10 scale.    Objective: Vital signs in last 24 hours:    Patient Vitals for the past 24 hrs:  BP Temp Temp src Pulse Resp SpO2  02/24/18 0530 140/75 98.1 F (36.7 C) Oral 72 17 96 %  02/24/18 0042 (!) 145/87 98.2 F (36.8 C) Oral 63 - 94 %  02/23/18 2019 (!) 145/80 98.1 F (36.7 C) Oral 67 18 96 %  02/23/18 1845 (!) 155/84 97.6 F (36.4 C) Oral 74 16 99 %  02/23/18 1435 131/78 98 F (36.7 C) Oral 72 16 97 %  02/23/18 1156 139/85 97.7 F (36.5 C) Oral 70 20 97 %  02/23/18 1135 135/75 (!) 97.5 F (36.4 C) - 64 12 98 %  02/23/18 1120 135/78 - - 67 11 96 %  02/23/18 1105 128/82 - - 61 11 96 %  02/23/18 1051 (!) 146/84 - - 68 13 97 %  02/23/18 1035 134/74 - - 62 (!) 9 97 %  02/23/18 1020 128/75 - - 69 14 97 %  02/23/18 1006 136/74 - - 65 12 97 %  02/23/18 0950 118/87 - - 64 15 97 %  02/23/18 0935 113/68 - - 61 10 96 %  02/23/18 0921 93/63 (!) 97.5 F (36.4 C) - 61 10 95 %    @flow {1959:LAST@   Intake/Output from previous day:   06/24 0701 - 06/25 0700 In: 1360 [P.O.:360; I.V.:1000] Out: 700 [Urine:600]   Intake/Output this shift:   No intake/output data recorded.   Intake/Output      06/24 0701 - 06/25 0700 06/25 0701 - 06/26 0700   P.O. 360    I.V. (mL/kg) 1000 (11.6)    Total Intake(mL/kg) 1360 (15.8)    Urine (mL/kg/hr) 600 (0.3)    Blood 100    Total Output 700    Net +660         Urine Occurrence 3 x       LABORATORY DATA: Recent Labs    02/17/18 1114  WBC 6.9  HGB 14.2  HCT 42.9  PLT 220   Recent Labs    02/17/18 1114  NA 132*  K 4.5   CL 100*  CO2 23  BUN 9  CREATININE 0.95  GLUCOSE 107*  CALCIUM 9.1   No results found for: INR, PROTIME  Examination:  General appearance: alert, cooperative and no distress Extremities: extremities normal, atraumatic, no cyanosis or edema  Wound Exam: clean, dry, intact   Drainage:  None: wound tissue dry  Motor Exam: Quadriceps and Hamstrings Intact  Sensory Exam: Superficial Peroneal, Deep Peroneal and Tibial normal   Assessment:    1 Day Post-Op  Procedure(s) (LRB): TOTAL KNEE ARTHROPLASTY (Right)  ADDITIONAL DIAGNOSIS:  Active Problems:   S/P total knee replacement     Plan: Physical Therapy as ordered Weight Bearing as Tolerated (WBAT)  DVT Prophylaxis:  Aspirin  DISCHARGE PLAN: Home  DISCHARGE NEEDS: HHPT   Patient doing very well, expected D/C home today   Patient's anticipated LOS is less than 2 midnights, meeting these requirements: - Lives within 1 hour of care - Has a competent adult at home to recover with post-op recover - NO history of  - Chronic pain requiring opiods  - Diabetes  - Coronary Artery Disease  - Heart failure  - Heart attack  - Stroke  - DVT/VTE  - Cardiac arrhythmia  - Respiratory Failure/COPD  - Renal failure  - Anemia  - Advanced Liver disease              Lisa Wheeler 02/24/2018, 7:06 AM

## 2018-02-24 NOTE — Progress Notes (Signed)
Discharge instructions completed with pt. Pt verbalized understanding of the information.  Pt denies chest pain, shortness of breath, dizziness, lightheadedness, and n/v.  Pt discharged home.  

## 2018-02-24 NOTE — Discharge Summary (Signed)
SPORTS MEDICINE & JOINT REPLACEMENT   Lisa Spurling, MD   Laurier Nancy, PA-C [address] [place], [place], [place]  [address]                             [phone]  PATIENT ID: Lisa Wheeler        MRN:  [id number]          DOB/AGE: 1950-12-02 / 67 y.o.    DISCHARGE SUMMARY  ADMISSION DATE:    02/23/2018 DISCHARGE DATE:   02/24/2018   ADMISSION DIAGNOSIS: Osteoarthritis Rt. Knee    DISCHARGE DIAGNOSIS:  Osteoarthritis Right Knee    ADDITIONAL DIAGNOSIS: Active Problems:   S/P total knee replacement  Past Medical History:  Diagnosis Date  . Arthritis   . Elevated lipids   . Hypertension   . Osteoarthritis of right knee    Severe  . Osteopenia    right hip    PROCEDURE: Procedure(s): TOTAL KNEE ARTHROPLASTY on 02/23/2018  CONSULTS:    HISTORY:  See H&P in chart  HOSPITAL COURSE:  Lisa Wheeler is a 67 y.o. admitted on 02/23/2018 and found to have a diagnosis of Osteoarthritis Right Knee.  After appropriate laboratory studies were obtained  they were taken to the operating room on 02/23/2018 and underwent Procedure(s): TOTAL KNEE ARTHROPLASTY.   They were given perioperative antibiotics:  Anti-infectives (From admission, onward)   Start     Dose/Rate Route Frequency Ordered Stop   02/23/18 1230  ceFAZolin (ANCEF) IVPB 2g/100 mL premix     2 g 200 mL/hr over 30 Minutes Intravenous Every 6 hours 02/23/18 1209 02/23/18 1823   02/23/18 0630  ceFAZolin (ANCEF) IVPB 2g/100 mL premix     2 g 200 mL/hr over 30 Minutes Intravenous On call to O.R. 02/23/18 0618 02/23/18 0745   02/23/18 0622  ceFAZolin (ANCEF) 2-4 GM/100ML-% IVPB    Note to Pharmacy:  Clovis Cao   : cabinet override      02/23/18 0622 02/23/18 0735    .  Patient given tranexamic acid IV or topical and exparel intra-operatively.  Tolerated the procedure well.    POD# 1: Vital signs were stable.  Patient denied Chest pain, shortness of breath, or calf pain.  Patient was started on Lovenox 30 mg  subcutaneously twice daily at 8am.  Consults to PT, OT, and care management were made.  The patient was weight bearing as tolerated.  CPM was placed on the operative leg 0-90 degrees for 6-8 hours a day. When out of the CPM, patient was placed in the foam block to achieve full extension. Incentive spirometry was taught.  Dressing was changed.       POD #2, Continued  PT for ambulation and exercise program.  IV saline locked.  O2 discontinued.    The remainder of the hospital course was dedicated to ambulation and strengthening.   The patient was discharged on 1 Day Post-Op in  Good condition.  Blood products given:none  DIAGNOSTIC STUDIES: Recent vital signs:  Patient Vitals for the past 24 hrs:  BP Temp Temp src Pulse Resp SpO2  02/24/18 0530 140/75 98.1 F (36.7 C) Oral 72 17 96 %  02/24/18 0042 (!) 145/87 98.2 F (36.8 C) Oral 63 - 94 %  02/23/18 2019 (!) 145/80 98.1 F (36.7 C) Oral 67 18 96 %  02/23/18 1845 (!) 155/84 97.6 F (36.4 C) Oral 74 16 99 %  02/23/18 1435  131/78 98 F (36.7 C) Oral 72 16 97 %       Recent laboratory studies: Recent Labs    02/24/18 0648  WBC 15.8*  HGB 11.9*  HCT 36.6  PLT 200   Recent Labs    02/24/18 0648  NA 137  K 3.8  CL 104  CO2 26  BUN 13  CREATININE 1.05*  GLUCOSE 143*  CALCIUM 8.8*   No results found for: INR, PROTIME   Recent Radiographic Studies :  No results found.  DISCHARGE INSTRUCTIONS: Discharge Instructions    CPM   Complete by:  As directed    Continuous passive motion machine (CPM):      Use the CPM from 0 to 90 for 4-6 hours per day.      You may increase by 10 per day.  You may break it up into 2 or 3 sessions per day.      Use CPM for 2 weeks or until you are told to stop.   Call MD / Call 911   Complete by:  As directed    If you experience chest pain or shortness of breath, CALL 911 and be transported to the hospital emergency room.  If you develope a fever above 101 F, pus (white drainage) or  increased drainage or redness at the wound, or calf pain, call your surgeon's office.   Constipation Prevention   Complete by:  As directed    Drink plenty of fluids.  Prune juice may be helpful.  You may use a stool softener, such as Colace (over the counter) 100 mg twice a day.  Use MiraLax (over the counter) for constipation as needed.   Diet - low sodium heart healthy   Complete by:  As directed    Discharge instructions   Complete by:  As directed    INSTRUCTIONS AFTER JOINT REPLACEMENT   Remove items at home which could result in a fall. This includes throw rugs or furniture in walking pathways ICE to the affected joint every three hours while awake for 30 minutes at a time, for at least the first 3-5 days, and then as needed for pain and swelling.  Continue to use ice for pain and swelling. You may notice swelling that will progress down to the foot and ankle.  This is normal after surgery.  Elevate your leg when you are not up walking on it.   Continue to use the breathing machine you got in the hospital (incentive spirometer) which will help keep your temperature down.  It is common for your temperature to cycle up and down following surgery, especially at night when you are not up moving around and exerting yourself.  The breathing machine keeps your lungs expanded and your temperature down.   DIET:  As you were doing prior to hospitalization, we recommend a well-balanced diet.  DRESSING / WOUND CARE / SHOWERING  Keep the surgical dressing until follow up.  The dressing is water proof, so you can shower without any extra covering.  IF THE DRESSING FALLS OFF or the wound gets wet inside, change the dressing with sterile gauze.  Please use good hand washing techniques before changing the dressing.  Do not use any lotions or creams on the incision until instructed by your surgeon.    ACTIVITY  Increase activity slowly as tolerated, but follow the weight bearing instructions below.   No  driving for 6 weeks or until further direction given by your physician.  You  cannot drive while taking narcotics.  No lifting or carrying greater than 10 lbs. until further directed by your surgeon. Avoid periods of inactivity such as sitting longer than an hour when not asleep. This helps prevent blood clots.  You may return to work once you are authorized by your doctor.     WEIGHT BEARING   Weight bearing as tolerated with assist device (walker, cane, etc) as directed, use it as long as suggested by your surgeon or therapist, typically at least 4-6 weeks.   EXERCISES  Results after joint replacement surgery are often greatly improved when you follow the exercise, range of motion and muscle strengthening exercises prescribed by your doctor. Safety measures are also important to protect the joint from further injury. Any time any of these exercises cause you to have increased pain or swelling, decrease what you are doing until you are comfortable again and then slowly increase them. If you have problems or questions, call your caregiver or physical therapist for advice.   Rehabilitation is important following a joint replacement. After just a few days of immobilization, the muscles of the leg can become weakened and shrink (atrophy).  These exercises are designed to build up the tone and strength of the thigh and leg muscles and to improve motion. Often times heat used for twenty to thirty minutes before working out will loosen up your tissues and help with improving the range of motion but do not use heat for the first two weeks following surgery (sometimes heat can increase post-operative swelling).   These exercises can be done on a training (exercise) mat, on the floor, on a table or on a bed. Use whatever works the best and is most comfortable for you.    Use music or television while you are exercising so that the exercises are a pleasant break in your day. This will make your life better  with the exercises acting as a break in your routine that you can look forward to.   Perform all exercises about fifteen times, three times per day or as directed.  You should exercise both the operative leg and the other leg as well.   Exercises include:   Quad Sets - Tighten up the muscle on the front of the thigh (Quad) and hold for 5-10 seconds.   Straight Leg Raises - With your knee straight (if you were given a brace, keep it on), lift the leg to 60 degrees, hold for 3 seconds, and slowly lower the leg.  Perform this exercise against resistance later as your leg gets stronger.  Leg Slides: Lying on your back, slowly slide your foot toward your buttocks, bending your knee up off the floor (only go as far as is comfortable). Then slowly slide your foot back down until your leg is flat on the floor again.  Angel Wings: Lying on your back spread your legs to the side as far apart as you can without causing discomfort.  Hamstring Strength:  Lying on your back, push your heel against the floor with your leg straight by tightening up the muscles of your buttocks.  Repeat, but this time bend your knee to a comfortable angle, and push your heel against the floor.  You may put a pillow under the heel to make it more comfortable if necessary.   A rehabilitation program following joint replacement surgery can speed recovery and prevent re-injury in the future due to weakened muscles. Contact your doctor or a physical therapist for more  information on knee rehabilitation.    CONSTIPATION  Constipation is defined medically as fewer than three stools per week and severe constipation as less than one stool per week.  Even if you have a regular bowel pattern at home, your normal regimen is likely to be disrupted due to multiple reasons following surgery.  Combination of anesthesia, postoperative narcotics, change in appetite and fluid intake all can affect your bowels.   YOU MUST use at least one of the  following options; they are listed in order of increasing strength to get the job done.  They are all available over the counter, and you may need to use some, POSSIBLY even all of these options:    Drink plenty of fluids (prune juice may be helpful) and high fiber foods Colace 100 mg by mouth twice a day  Senokot for constipation as directed and as needed Dulcolax (bisacodyl), take with full glass of water  Miralax (polyethylene glycol) once or twice a day as needed.  If you have tried all these things and are unable to have a bowel movement in the first 3-4 days after surgery call either your surgeon or your primary doctor.    If you experience loose stools or diarrhea, hold the medications until you stool forms back up.  If your symptoms do not get better within 1 week or if they get worse, check with your doctor.  If you experience "the worst abdominal pain ever" or develop nausea or vomiting, please contact the office immediately for further recommendations for treatment.   ITCHING:  If you experience itching with your medications, try taking only a single pain pill, or even half a pain pill at a time.  You can also use Benadryl over the counter for itching or also to help with sleep.   TED HOSE STOCKINGS:  Use stockings on both legs until for at least 2 weeks or as directed by physician office. They may be removed at night for sleeping.  MEDICATIONS:  See your medication summary on the "After Visit Summary" that nursing will review with you.  You may have some home medications which will be placed on hold until you complete the course of blood thinner medication.  It is important for you to complete the blood thinner medication as prescribed.  PRECAUTIONS:  If you experience chest pain or shortness of breath - call 911 immediately for transfer to the hospital emergency department.   If you develop a fever greater that 101 F, purulent drainage from wound, increased redness or drainage from  wound, foul odor from the wound/dressing, or calf pain - CONTACT YOUR SURGEON.                                                   FOLLOW-UP APPOINTMENTS:  If you do not already have a post-op appointment, please call the office for an appointment to be seen by your surgeon.  Guidelines for how soon to be seen are listed in your "After Visit Summary", but are typically between 1-4 weeks after surgery.  OTHER INSTRUCTIONS:   Knee Replacement:  Do not place pillow under knee, focus on keeping the knee straight while resting. CPM instructions: 0-90 degrees, 2 hours in the morning, 2 hours in the afternoon, and 2 hours in the evening. Place foam block, curve side up under heel at  all times except when in CPM or when walking.  DO NOT modify, tear, cut, or change the foam block in any way.  MAKE SURE YOU:  Understand these instructions.  Get help right away if you are not doing well or get worse.    Thank you for letting us be a part of your medical care team.  It is a privilege we respect greatly.  We hope these instructions will help you stay on track for a fast and full recovery!   Increase activity slowly as tolerated   Complete by:  As directed       DISCHARGE MEDICATIONS:   Allergies as of 02/24/2018      Reactions   Adhesive [tape]    Redness, removes skin    Mercury    Migraines/ headaches due to dental fillings       Medication List    STOP taking these medications   naproxen sodium 220 MG tablet Commonly known as:  ALEVE     TAKE these medications   acetaminophen 500 MG tablet Commonly known as:  TYLENOL Take 1,000 mg by mouth daily as needed for moderate pain or headache.   aspirin 325 MG EC tablet Take 1 tablet (325 mg total) by mouth 2 (two) times daily.   AZOR 10-40 MG tablet Generic drug:  amLODipine-olmesartan Take 1 tablet by mouth daily.   BIOTIN PO Take 2,000 mcg by mouth daily.   methocarbamol 500 MG tablet Commonly known as:  ROBAXIN Take 1 tablet (500  mg total) by mouth every 6 (six) hours as needed for muscle spasms.   oxyCODONE 5 MG immediate release tablet Commonly known as:  Oxy IR/ROXICODONE Take 1-2 tablets (5-10 mg total) by mouth every 6 (six) hours as needed for moderate pain (pain score 4-6).   oyster calcium 500 MG Tabs tablet Take 500 mg of elemental calcium by mouth daily.   vitamin C 500 MG tablet Commonly known as:  ASCORBIC ACID Take 500 mg by mouth daily.   VITAMIN D PO Take 1,200 Int'l Units by mouth daily.            Durable Medical Equipment  (From admission, onward)        Start     Ordered   02/23/18 1210  DME Walker rolling  Once    Question:  Patient needs a walker to treat with the following condition  Answer:  S/P total knee replacement   02/23/18 1209   02/23/18 1210  DME 3 n 1  Once     02/23/18 1209   02/23/18 1210  DME Bedside commode  Once    Question:  Patient needs a bedside commode to treat with the following condition  Answer:  S/P total knee replacement   02/23/18 1209      FOLLOW UP VISIT:   Follow-up Information    [facility] Follow up.   Specialty:  Home Health Services Why:  A representative from [facility] will contact you to arrange start date and time for your therapy. Contact information: [address] [place] [address] [phone]           DISPOSITION: HOME VS. SNF  CONDITION:  Good   Guy Sandifer 02/24/2018, 12:59 PM

## 2018-02-24 NOTE — Op Note (Signed)
TOTAL KNEE REPLACEMENT OPERATIVE NOTE:  02/23/2018  3:20 PM  PATIENT:  Lisa Wheeler  67 y.o. female  PRE-OPERATIVE DIAGNOSIS:  Osteoarthritis Right Knee  POST-OPERATIVE DIAGNOSIS:  Osteoarthritis Right Knee  PROCEDURE:  Procedure(s): TOTAL KNEE ARTHROPLASTY  SURGEON:  Surgeon(s): Dannielle HuhLucey, Indy Prestwood, MD  PHYSICIAN ASSISTANT: Laurier Nancyolby Robbins, [facility]  ANESTHESIA:   spinal  DRAINS: Hemovac  SPECIMEN: None  COUNTS:  Correct  TOURNIQUET:   Total Tourniquet Time Documented: Thigh (Right) - 43 minutes Total: Thigh (Right) - 43 minutes   DICTATION:  Indication for procedure:    The patient is a 67 y.o. female who has failed conservative treatment for Osteoarthritis Right Knee.  Informed consent was obtained prior to anesthesia. The risks versus benefits of the operation were explain and in a way the patient can, and did, understand.   On the implant demand matching protocol, this patient scored 9.  Therefore, this patient was not receive a polyethylene insert with vitamin E which is a high demand implant.  Description of procedure:     The patient was taken to the operating room and placed under anesthesia.  The patient was positioned in the usual fashion taking care that all body parts were adequately padded and/or protected.  I foley catheter was not placed.  A tourniquet was applied and the leg prepped and draped in the usual sterile fashion.  The extremity was exsanguinated with the esmarch and tourniquet inflated to 350 mmHg.  Pre-operative range of motion was normal.  The knee was in 5 degree of mild valgus.  A midline incision approximately 6-7 inches long was made with a #10 blade.  A new blade was used to make a parapatellar arthrotomy going 2-3 cm into the quadriceps tendon, over the patella, and alongside the medial aspect of the patellar tendon.  A synovectomy was then performed with the #10 blade and forceps. I then elevated the deep MCL off the medial tibial metaphysis  subperiosteally around to the semimembranosus attachment.    I everted the patella and used calipers to measure patellar thickness.  I used the reamer to ream down to appropriate thickness to recreate the native thickness.  I then removed excess bone with the rongeur and sagittal saw.  I used the appropriately sized template and drilled the three lug holes.  I then put the trial in place and measured the thickness with the calipers to ensure recreation of the native thickness.  The trial was then removed and the patella subluxed and the knee brought into flexion.  A homan retractor was place to retract and protect the patella and lateral structures.  A Z-retractor was place medially to protect the medial structures.  The extra-medullary alignment system was used to make cut the tibial articular surface perpendicular to the anamotic axis of the tibia and in 3 degrees of posterior slope.  The cut surface and alignment jig was removed.  I then used the intramedullary alignment guide to make a 4 valgus cut on the distal femur.  I then marked out the epicondylar axis on the distal femur.  The posterior condylar axis measured 3 degrees.  I then used the anterior referencing sizer and measured the femur to be a size 10.  The 4-In-1 cutting block was screwed into place in external rotation matching the posterior condylar angle, making our cuts perpendicular to the epicondylar axis.  Anterior, posterior and chamfer cuts were made with the sagittal saw.  The cutting block and cut pieces were removed.  A  lamina spreader was placed in 90 degrees of flexion.  The ACL, PCL, menisci, and posterior condylar osteophytes were removed.  A 10 mm spacer blocked was found to offer good flexion and extension gap balance after mild in degree releasing.   The scoop retractor was then placed and the femoral finishing block was pinned in place.  The small sagittal saw was used as well as the lug drill to finish the femur.  The block  and cut surfaces were removed and the medullary canal hole filled with autograft bone from the cut pieces.  The tibia was delivered forward in deep flexion and external rotation.  A size E tray was selected and pinned into place centered on the medial 1/3 of the tibial tubercle.  The reamer and keel was used to prepare the tibia through the tray.    I then trialed with the size 10 femur, size E tibia, a 11 mm insert and the 35 patella.  I had excellent flexion/extension gap balance, excellent patella tracking.  Flexion was full and beyond 120 degrees; extension was zero.  These components were chosen and the staff opened them to me on the back table while the knee was lavaged copiously and the cement mixed.  The soft tissue was infiltrated with 60cc of exparel 1.3% through a 21 gauge needle.  I cemented in the components and removed all excess cement.  The polyethylene tibial component was snapped into place and the knee placed in extension while cement was hardening.  The capsule was infilltrated with 30cc of .25% Marcaine with epinephrine.  A hemovac was place in the joint exiting superolaterally.  A pain pump was place superomedially superficial to the arthrotomy.  Once the cement was hard, the tourniquet was let down.  Hemostasis was obtained.  The arthrotomy was closed with figure-8 #1 vicryl sutures.  The deep soft tissues were closed with #0 vicryls and the subcuticular layer closed with a running #2-0 vicryl.  The skin was reapproximated and closed with skin staples.  The wound was dressed with xeroform, 4 x4's, 2 ABD sponges, a single layer of webril and a TED stocking.   The patient was then awakened, extubated, and taken to the recovery room in stable condition.  BLOOD LOSS:  300cc DRAINS: 1 hemovac, 1 pain catheter COMPLICATIONS:  None.  PLAN OF CARE: Admit for overnight observation  PATIENT DISPOSITION:  PACU - hemodynamically stable.   Delay start of Pharmacological VTE agent (>24hrs)  due to surgical blood loss or risk of bleeding:  not applicable  Please fax a copy of this op note to my office at [phone] (please only include page 1 and 2 of the Case Information op note)

## 2018-02-24 NOTE — Progress Notes (Signed)
Physical Therapy Treatment Patient Details Name: Lisa Wheeler MRN: [id number] DOB: June 15, 1951 Today's Date: 02/24/2018    History of Present Illness Pt is a 67 y/o female s/p elective R TKA. PMH includes HTN, osteopenia, R hand fracture surgery.     PT Comments    Pt eager to go home and requesting to complete all PT goals this AM. Completed HEP and stair training. Pt ambulated 750 ft in hall with supervision for safety. Pt mobilizing well and progressing towards goals. Appropriated to d/c home today.   Follow Up Recommendations  Follow surgeon's recommendation for DC plan and follow-up therapies;Supervision for mobility/OOB     Equipment Recommendations  None recommended by PT(refusing 3 in 1)    Recommendations for Other Services       Precautions / Restrictions Precautions Precautions: Knee Precaution Booklet Issued: Yes (comment) Precaution Comments: Reviewed knee precautions and remaining HEP Restrictions Weight Bearing Restrictions: Yes RLE Weight Bearing: Weight bearing as tolerated    Mobility  Bed Mobility Overal bed mobility: Needs Assistance Bed Mobility: Supine to Sit     Supine to sit: Supervision;HOB elevated     General bed mobility comments: Supervision for safety.   Transfers Overall transfer level: Needs assistance Equipment used: Rolling walker (2 wheeled) Transfers: Sit to/from Stand Sit to Stand: Supervision         General transfer comment: Supervision for safety. Good technique with RW.  Ambulation/Gait Ambulation/Gait assistance: Supervision Gait Distance (Feet): 750 Feet Assistive device: Rolling walker (2 wheeled) Gait Pattern/deviations: Decreased weight shift to right;Antalgic;Step-through pattern Gait velocity: Decreased    General Gait Details: Mildly antalgic gait pattern. Overall steady using RW.   Stairs Stairs: Yes Stairs assistance: Min assist Stair Management: No rails;Step to pattern;Backwards;With  walker Number of Stairs: 2 General stair comments: Min A for RW management on steps and cues for technique. Handout provided.   Wheelchair Mobility    Modified Rankin (Stroke Patients Only)       Balance Overall balance assessment: Needs assistance Sitting-balance support: No upper extremity supported;Feet supported Sitting balance-Leahy Scale: Good     Standing balance support: Bilateral upper extremity supported;No upper extremity supported;During functional activity Standing balance-Leahy Scale: Fair Standing balance comment: able to static stand for oral and hand hygiene at sink.                            Cognition Arousal/Alertness: Awake/alert Behavior During Therapy: WFL for tasks assessed/performed Overall Cognitive Status: Within Functional Limits for tasks assessed                                        Exercises Total Joint Exercises Short Arc Quad: AROM;Right;10 reps;Supine Heel Slides: AROM;Right;10 reps;Seated(towel under foot) Hip ABduction/ADduction: AROM;Right;10 reps;Supine Straight Leg Raises: AROM;Right;10 reps;Supine Long Arc Quad: AROM;Right;10 reps;Seated Knee Flexion: AROM;Right;10 reps;Seated(10 sec holds)    General Comments        Pertinent Vitals/Pain Pain Assessment: No/denies pain    Home Living                      Prior Function            PT Goals (current goals can now be found in the care plan section) Acute Rehab PT Goals Patient Stated Goal: To go home  PT Goal Formulation: With patient Time For Goal Achievement:  03/09/18 Potential to Achieve Goals: Good Progress towards PT goals: Progressing toward goals    Frequency    7X/week      PT Plan Current plan remains appropriate    Co-evaluation              AM-PAC PT "6 Clicks" Daily Activity  Outcome Measure  Difficulty turning over in bed (including adjusting bedclothes, sheets and blankets)?: None Difficulty  moving from lying on back to sitting on the side of the bed? : None Difficulty sitting down on and standing up from a chair with arms (e.g., wheelchair, bedside commode, etc,.)?: None Help needed moving to and from a bed to chair (including a wheelchair)?: None Help needed walking in hospital room?: None Help needed climbing 3-5 steps with a railing? : A Little 6 Click Score: 23    End of Session Equipment Utilized During Treatment: Gait belt Activity Tolerance: Patient tolerated treatment well Patient left: with call bell/phone within reach;in bed Nurse Communication: Mobility status PT Visit Diagnosis: Muscle weakness (generalized) (M62.81);Pain;Other abnormalities of gait and mobility (R26.89) Pain - Right/Left: Right Pain - part of body: Knee     Time: [id number] PT Time Calculation (min) (ACUTE ONLY): 35 min  Charges:  $Gait Training: 8-22 mins $Therapeutic Exercise: 8-22 mins                    G Codes:       Kallie LocksHannah Niels Cranshaw, [place] Pager [phone] Acute Rehab   Sheral ApleyHannah E Gina Costilla 02/24/2018, 10:50 AM

## 2018-02-24 NOTE — Care Management Obs Status (Signed)
MEDICARE OBSERVATION STATUS NOTIFICATION   Patient Details  Name: Lisa GauzeDeborah L Mancusi MRN: [id number] Date of Birth: Mar 10, 1951   Medicare Observation Status Notification Given:  Yes    Durenda GuthrieBrady, Ulyssa Walthour Naomi, RN 02/24/2018, 11:12 AM

## 2018-02-24 NOTE — Care Management Note (Signed)
Case Management Note  Patient Details  Name: Lisa Wheeler MRN: [id number] Date of Birth: 08-02-1951  Subjective/Objective:  67 yr old female s/p right total knee arthroplasty.                  Action/Plan: Case manager spoke with patient concerning discharge plan and DME. Patient was preoperatively setup with [facility], no changes. She will have family support at discharge.     Expected Discharge Date:    02/24/18             Expected Discharge Plan:  Home w Home Health Services  In-House Referral:  NA  Discharge planning Services  CM Consult  Post Acute Care Choice:  Home Health Choice offered to:  Patient  DME Arranged:  CPM, Walker rolling DME Agency:  [facility] [employer]  HH Arranged:  PT HH Agency:  [facility]  Status of Service:  Completed, signed off  If discussed at [employer] Meetings, dates discussed:    Additional Comments:  Durenda GuthrieBrady, Damion Kant Naomi, RN 02/24/2018, 11:13 AM

## 2018-02-24 NOTE — Care Management Note (Signed)
Case Management Note  Patient Details  Name: Lisa Wheeler MRN: [id number] Date of Birth: [birth year]  Subjective/Objective:                    Action/Plan:   Expected Discharge Date:                  Expected Discharge Plan:  Home w Home Health Services  In-House Referral:  NA  Discharge planning Services  CM Consult  Post Acute Care Choice:  Home Health Choice offered to:  Patient  DME Arranged:  3-N-1, Wheelchair manual, CPM DME Agency:  [facility]  HH Arranged:  PT HH Agency:  [facility]  Status of Service:  Completed, signed off  If discussed at [employer] Meetings, dates discussed:    Additional Comments:  Lisa GuthrieBrady, Lisa Mcilhenny Naomi, RN 02/24/2018, 11:02 AM

## 2018-04-13 ENCOUNTER — Other Ambulatory Visit: Payer: Self-pay | Admitting: Orthopedic Surgery

## 2018-05-26 NOTE — Progress Notes (Signed)
02-17-18 (Epic) EKG

## 2018-05-26 NOTE — Patient Instructions (Signed)
Burney GauzeDeborah L Wheeler  05/26/2018   Your procedure is scheduled on: 06-01-18   [facility]    Report to Admitting at 5:30 AM    Call this number if you have problems the morning of surgery [phone]   Remember: Do not eat food or drink liquids :After Midnight.    BRUSH YOUR TEETH MORNING OF SURGERY AND RINSE YOUR MOUTH OUT, NO CHEWING GUM CANDY OR MINTS.     Take these medicines the morning of surgery with A SIP OF WATER: None                                You may not have any metal on your body including hair pins and              piercings  Do not wear jewelry, make-up, lotions, powders or perfumes, deodorant             Do not wear nail polish.  Do not shave  48 hours prior to surgery.            .   Do not bring valuables to the hospital. [facility] IS NOT             RESPONSIBLE   FOR VALUABLES.  Contacts, dentures or bridgework may not be worn into surgery.  Leave suitcase in the car. After surgery it may be brought to your room.      Special Instructions: N/A              Please read over the following fact sheets you were given: _____________________________________________________________________             [facility] - Preparing [facility] Before surgery, you can play an important role.  Because skin is not sterile, your skin needs to be as free of germs as possible.  You can reduce the number of germs on your skin by washing with CHG (chlorahexidine gluconate) soap before surgery.  CHG is an antiseptic cleaner which kills germs and bonds with the skin to continue killing germs even after washing. Please DO NOT use if you have an allergy to CHG or antibacterial soaps.  If your skin becomes reddened/irritated stop using the CHG and inform your nurse when you arrive at Short Stay. Do not shave (including legs and underarms) for at least 48 hours prior to the first CHG shower.  You may shave your face/neck. Please  follow these instructions carefully:  1.  Shower with CHG Soap the night before surgery and the  morning of Surgery.  2.  If you choose to wash your hair, wash your hair first as usual with your  normal  shampoo.  3.  After you shampoo, rinse your hair and body thoroughly to remove the  shampoo.                           4.  Use CHG as you would any other liquid soap.  You can apply chg directly  to the skin and wash                       Gently with a scrungie or clean washcloth.  5.  Apply the CHG Soap to your body ONLY FROM  THE NECK DOWN.   Do not use on face/ open                           Wound or open sores. Avoid contact with eyes, ears mouth and genitals (private parts).                       Wash face,  Genitals (private parts) with your normal soap.             6.  Wash thoroughly, paying special attention to the area where your surgery  will be performed.  7.  Thoroughly rinse your body with warm water from the neck down.  8.  DO NOT shower/wash with your normal soap after using and rinsing off  the CHG Soap.                9.  Pat yourself dry with a clean towel.            10.  Wear clean pajamas.            11.  Place clean sheets on your bed the night of your first shower and do not  sleep with pets. Day of Surgery : Do not apply any lotions/deodorants the morning of surgery.  Please wear clean clothes to the hospital/surgery center.  FAILURE TO FOLLOW THESE INSTRUCTIONS MAY RESULT IN THE CANCELLATION OF YOUR SURGERY PATIENT SIGNATURE_________________________________  NURSE SIGNATURE__________________________________  ________________________________________________________________________   Lisa Wheeler  An incentive spirometer is a tool that can help keep your lungs clear and active. This tool measures how well you are filling your lungs with each breath. Taking long deep breaths may help reverse or decrease the chance of developing breathing (pulmonary) problems  (especially infection) following:  A long period of time when you are unable to move or be active. BEFORE THE PROCEDURE   If the spirometer includes an indicator to show your best effort, your nurse or respiratory therapist will set it to a desired goal.  If possible, sit up straight or lean slightly forward. Try not to slouch.  Hold the incentive spirometer in an upright position. INSTRUCTIONS FOR USE  1. Sit on the edge of your bed if possible, or sit up as far as you can in bed or on a chair. 2. Hold the incentive spirometer in an upright position. 3. Breathe out normally. 4. Place the mouthpiece in your mouth and seal your lips tightly around it. 5. Breathe in slowly and as deeply as possible, raising the piston or the ball toward the top of the column. 6. Hold your breath for 3-5 seconds or for as long as possible. Allow the piston or ball to fall to the bottom of the column. 7. Remove the mouthpiece from your mouth and breathe out normally. 8. Rest for a few seconds and repeat Steps 1 through 7 at least 10 times every 1-2 hours when you are awake. Take your time and take a few normal breaths between deep breaths. 9. The spirometer may include an indicator to show your best effort. Use the indicator as a goal to work toward during each repetition. 10. After each set of 10 deep breaths, practice coughing to be sure your lungs are clear. If you have an incision (the cut made at the time of surgery), support your incision when coughing by placing a pillow or rolled up towels firmly against it. Once you are  able to get out of bed, walk around indoors and cough well. You may stop using the incentive spirometer when instructed by your caregiver.  RISKS AND COMPLICATIONS  Take your time so you do not get dizzy or light-headed.  If you are in pain, you may need to take or ask for pain medication before doing incentive spirometry. It is harder to take a deep breath if you are having  pain. AFTER USE  Rest and breathe slowly and easily.  It can be helpful to keep track of a log of your progress. Your caregiver can provide you with a simple table to help with this. If you are using the spirometer at home, follow these instructions: Hundred IF:   You are having difficultly using the spirometer.  You have trouble using the spirometer as often as instructed.  Your pain medication is not giving enough relief while using the spirometer.  You develop fever of 100.5 F (38.1 C) or higher. SEEK IMMEDIATE MEDICAL CARE IF:   You cough up bloody sputum that had not been present before.  You develop fever of 102 F (38.9 C) or greater.  You develop worsening pain at or near the incision site. MAKE SURE YOU:   Understand these instructions.  Will watch your condition.  Will get help right away if you are not doing well or get worse. Document Released: 12/30/2006 Document Revised: 11/11/2011 Document Reviewed: 03/02/2007 [facility] Patient Information 2014 [place], [place].   ________________________________________________________________________

## 2018-05-28 ENCOUNTER — Other Ambulatory Visit: Payer: Self-pay

## 2018-05-28 ENCOUNTER — Encounter (HOSPITAL_COMMUNITY): Payer: Self-pay

## 2018-05-28 ENCOUNTER — Encounter (HOSPITAL_COMMUNITY)
Admission: RE | Admit: 2018-05-28 | Discharge: 2018-05-28 | Disposition: A | Discharge status: Home / Self Care | Payer: Medicare Other | Source: Ambulatory Visit | Attending: Orthopedic Surgery | Admitting: Orthopedic Surgery

## 2018-05-28 DIAGNOSIS — Z01812 Encounter for preprocedural laboratory examination: Secondary | ICD-10-CM | POA: Diagnosis not present

## 2018-05-28 DIAGNOSIS — M1712 Unilateral primary osteoarthritis, left knee: Secondary | ICD-10-CM | POA: Diagnosis not present

## 2018-05-28 LAB — COMPREHENSIVE METABOLIC PANEL
ALT: 19 U/L (ref 0–44)
AST: 19 U/L (ref 15–41)
Albumin: 4.3 g/dL (ref 3.5–5.0)
Alkaline Phosphatase: 57 U/L (ref 38–126)
Anion gap: 7 (ref 5–15)
BILIRUBIN TOTAL: 0.6 mg/dL (ref 0.3–1.2)
BUN: 13 mg/dL (ref 8–23)
CALCIUM: 9.3 mg/dL (ref 8.9–10.3)
CO2: 26 mmol/L (ref 22–32)
CREATININE: 0.85 mg/dL (ref 0.44–1.00)
Chloride: 105 mmol/L (ref 98–111)
Glucose, Bld: 107 mg/dL — ABNORMAL HIGH (ref 70–99)
Potassium: 5 mmol/L (ref 3.5–5.1)
Sodium: 138 mmol/L (ref 135–145)
TOTAL PROTEIN: 7.4 g/dL (ref 6.5–8.1)

## 2018-05-28 LAB — CBC WITH DIFFERENTIAL/PLATELET
Basophils Absolute: 0 10*3/uL (ref 0.0–0.1)
Basophils Relative: 0 %
EOS PCT: 1 %
Eosinophils Absolute: 0.1 10*3/uL (ref 0.0–0.7)
HEMATOCRIT: 46.6 % — AB (ref 36.0–46.0)
Hemoglobin: 15 g/dL (ref 12.0–15.0)
LYMPHS ABS: 1.9 10*3/uL (ref 0.7–4.0)
Lymphocytes Relative: 30 %
MCH: 28.4 pg (ref 26.0–34.0)
MCHC: 32.2 g/dL (ref 30.0–36.0)
MCV: 88.1 fL (ref 78.0–100.0)
MONO ABS: 0.5 10*3/uL (ref 0.1–1.0)
Monocytes Relative: 8 %
NEUTROS ABS: 3.8 10*3/uL (ref 1.7–7.7)
Neutrophils Relative %: 61 %
PLATELETS: 254 10*3/uL (ref 150–400)
RBC: 5.29 MIL/uL — AB (ref 3.87–5.11)
RDW: 13.1 % (ref 11.5–15.5)
WBC: 6.2 10*3/uL (ref 4.0–10.5)

## 2018-05-28 LAB — SURGICAL PCR SCREEN
MRSA, PCR: NEGATIVE
STAPHYLOCOCCUS AUREUS: NEGATIVE

## 2018-05-31 MED ORDER — BUPIVACAINE LIPOSOME 1.3 % IJ SUSP
20.0000 mL | Freq: Once | INTRAMUSCULAR | Status: DC
  Filled 2018-05-31: qty 20

## 2018-05-31 NOTE — Anesthesia Preprocedure Evaluation (Addendum)
Anesthesia Evaluation  Patient identified by MRN, date of birth, ID band Patient awake    Reviewed: Allergy & Precautions, NPO status , Patient's Chart, lab work & pertinent test results  Airway Mallampati: III  TM Distance: >3 FB Neck ROM: Full    Dental no notable dental hx.    Pulmonary neg pulmonary ROS,    Pulmonary exam normal breath sounds clear to auscultation       Cardiovascular hypertension, Normal cardiovascular exam Rhythm:Regular Rate:Normal     Neuro/Psych negative neurological ROS  negative psych ROS   GI/Hepatic negative GI ROS, Neg liver ROS,   Endo/Other  negative endocrine ROS  Renal/GU negative Renal ROS     Musculoskeletal  (+) Arthritis ,   Abdominal   Peds  Hematology negative hematology ROS (+)   Anesthesia Other Findings Osteoarthritis left knee  Reproductive/Obstetrics                           Anesthesia Physical Anesthesia Plan  ASA: II  Anesthesia Plan: Spinal and Regional   Post-op Pain Management:    Induction: Intravenous  PONV Risk Score and Plan: 2 and Propofol infusion, Treatment may vary due to age or medical condition and Ondansetron  Airway Management Planned: Natural Airway  Additional Equipment:   Intra-op Plan:   Post-operative Plan:   Informed Consent: I have reviewed the patients History and Physical, chart, labs and discussed the procedure including the risks, benefits and alternatives for the proposed anesthesia with the patient or authorized representative who has indicated his/her understanding and acceptance.   Dental advisory given  Plan Discussed with: CRNA  Anesthesia Plan Comments:        Anesthesia Quick Evaluation

## 2018-06-01 ENCOUNTER — Other Ambulatory Visit: Payer: Self-pay

## 2018-06-01 ENCOUNTER — Encounter (HOSPITAL_COMMUNITY): Payer: Self-pay | Admitting: *Deleted

## 2018-06-01 ENCOUNTER — Ambulatory Visit (HOSPITAL_COMMUNITY): Payer: Medicare Other | Admitting: Anesthesiology

## 2018-06-01 ENCOUNTER — Encounter (HOSPITAL_COMMUNITY)
Admission: RE | Disposition: A | Discharge status: Home / Self Care | Payer: Self-pay | Source: Ambulatory Visit | Attending: Orthopedic Surgery

## 2018-06-01 ENCOUNTER — Observation Stay (HOSPITAL_COMMUNITY)
Admission: RE | Admit: 2018-06-01 | Discharge: 2018-06-02 | Disposition: A | Discharge status: Home / Self Care | Payer: Medicare Other | Source: Ambulatory Visit | Attending: Orthopedic Surgery | Admitting: Orthopedic Surgery

## 2018-06-01 DIAGNOSIS — Z79899 Other long term (current) drug therapy: Secondary | ICD-10-CM | POA: Insufficient documentation

## 2018-06-01 DIAGNOSIS — M1712 Unilateral primary osteoarthritis, left knee: Principal | ICD-10-CM | POA: Insufficient documentation

## 2018-06-01 DIAGNOSIS — I110 Hypertensive heart disease with heart failure: Secondary | ICD-10-CM | POA: Insufficient documentation

## 2018-06-01 DIAGNOSIS — M25762 Osteophyte, left knee: Secondary | ICD-10-CM | POA: Diagnosis not present

## 2018-06-01 DIAGNOSIS — M25562 Pain in left knee: Secondary | ICD-10-CM | POA: Diagnosis present

## 2018-06-01 DIAGNOSIS — Z96651 Presence of right artificial knee joint: Secondary | ICD-10-CM | POA: Diagnosis not present

## 2018-06-01 DIAGNOSIS — I10 Essential (primary) hypertension: Secondary | ICD-10-CM | POA: Insufficient documentation

## 2018-06-01 DIAGNOSIS — Z96659 Presence of unspecified artificial knee joint: Secondary | ICD-10-CM

## 2018-06-01 HISTORY — PX: TOTAL KNEE ARTHROPLASTY: SHX125

## 2018-06-01 SURGERY — ARTHROPLASTY, KNEE, TOTAL
Anesthesia: Regional | Site: Knee | Laterality: Left

## 2018-06-01 MED ORDER — ONDANSETRON HCL 4 MG/2ML IJ SOLN: 4.0000 mg | Freq: Once | INTRAMUSCULAR | Status: DC | PRN

## 2018-06-01 MED ORDER — PANTOPRAZOLE SODIUM 40 MG PO TBEC
40.0000 mg | DELAYED_RELEASE_TABLET | Freq: Every day | ORAL | Status: DC
  Administered 2018-06-01 – 2018-06-02 (×2): 40 mg via ORAL
  Filled 2018-06-01 (×2): qty 1

## 2018-06-01 MED ORDER — AMLODIPINE BESYLATE 10 MG PO TABS
10.0000 mg | ORAL_TABLET | Freq: Every day | ORAL | Status: DC
  Administered 2018-06-02: 10 mg via ORAL
  Filled 2018-06-01: qty 1

## 2018-06-01 MED ORDER — ROPIVACAINE HCL 5 MG/ML IJ SOLN
INTRAMUSCULAR | Status: DC | PRN
  Administered 2018-06-01: 30 mL via PERINEURAL

## 2018-06-01 MED ORDER — ZOLPIDEM TARTRATE 5 MG PO TABS: 5.0000 mg | ORAL_TABLET | Freq: Every evening | ORAL | Status: DC | PRN

## 2018-06-01 MED ORDER — PROPOFOL 10 MG/ML IV BOLUS
INTRAVENOUS | Status: DC | PRN
  Administered 2018-06-01: 30 mg via INTRAVENOUS

## 2018-06-01 MED ORDER — ONDANSETRON HCL 4 MG/2ML IJ SOLN
INTRAMUSCULAR | Status: AC
  Filled 2018-06-01: qty 2

## 2018-06-01 MED ORDER — BUPIVACAINE-EPINEPHRINE 0.5% -1:200000 IJ SOLN
INTRAMUSCULAR | Status: DC | PRN
  Administered 2018-06-01: 20 mL

## 2018-06-01 MED ORDER — PHENOL 1.4 % MT LIQD
1.0000 | OROMUCOSAL | Status: DC | PRN
  Filled 2018-06-01: qty 177

## 2018-06-01 MED ORDER — BUPIVACAINE-EPINEPHRINE (PF) 0.5% -1:200000 IJ SOLN
INTRAMUSCULAR | Status: AC
  Filled 2018-06-01: qty 30

## 2018-06-01 MED ORDER — AMLODIPINE-OLMESARTAN 10-40 MG PO TABS: 1.0000 | ORAL_TABLET | Freq: Every day | ORAL | Status: DC

## 2018-06-01 MED ORDER — STERILE WATER FOR INJECTION IJ SOLN
INTRAMUSCULAR | Status: DC | PRN
  Administered 2018-06-01: 2000 mL

## 2018-06-01 MED ORDER — OLMESARTAN MEDOXOMIL 40 MG PO TABS
40.0000 mg | ORAL_TABLET | Freq: Every day | ORAL | Status: DC
  Administered 2018-06-02: 40 mg via ORAL
  Filled 2018-06-01: qty 1

## 2018-06-01 MED ORDER — LACTATED RINGERS IV SOLN
INTRAVENOUS | Status: DC
  Administered 2018-06-01 (×3): via INTRAVENOUS

## 2018-06-01 MED ORDER — ACETAMINOPHEN 500 MG PO TABS
1000.0000 mg | ORAL_TABLET | Freq: Four times a day (QID) | ORAL | Status: AC
  Administered 2018-06-01 – 2018-06-02 (×4): 1000 mg via ORAL
  Filled 2018-06-01 (×4): qty 2

## 2018-06-01 MED ORDER — FERROUS SULFATE 325 (65 FE) MG PO TABS
325.0000 mg | ORAL_TABLET | Freq: Three times a day (TID) | ORAL | Status: DC
  Administered 2018-06-02 (×2): 325 mg via ORAL
  Filled 2018-06-01 (×2): qty 1

## 2018-06-01 MED ORDER — CEFAZOLIN SODIUM-DEXTROSE 2-4 GM/100ML-% IV SOLN
2.0000 g | INTRAVENOUS | Status: AC
  Administered 2018-06-01: 2 g via INTRAVENOUS
  Filled 2018-06-01: qty 100

## 2018-06-01 MED ORDER — ASPIRIN EC 325 MG PO TBEC
325.0000 mg | DELAYED_RELEASE_TABLET | Freq: Two times a day (BID) | ORAL | Status: DC
  Administered 2018-06-02: 325 mg via ORAL
  Filled 2018-06-01: qty 1

## 2018-06-01 MED ORDER — BUPIVACAINE IN DEXTROSE 0.75-8.25 % IT SOLN
INTRATHECAL | Status: DC | PRN
  Administered 2018-06-01: 1.8 mL via INTRATHECAL

## 2018-06-01 MED ORDER — MENTHOL 3 MG MT LOZG: 1.0000 | LOZENGE | OROMUCOSAL | Status: DC | PRN

## 2018-06-01 MED ORDER — ONDANSETRON HCL 4 MG PO TABS: 4.0000 mg | ORAL_TABLET | Freq: Four times a day (QID) | ORAL | Status: DC | PRN

## 2018-06-01 MED ORDER — BUPIVACAINE LIPOSOME 1.3 % IJ SUSP
20.0000 mL | Freq: Once | INTRAMUSCULAR | Status: AC
  Administered 2018-06-01: 20 mL
  Filled 2018-06-01: qty 20

## 2018-06-01 MED ORDER — DOCUSATE SODIUM 100 MG PO CAPS
100.0000 mg | ORAL_CAPSULE | Freq: Two times a day (BID) | ORAL | Status: DC
  Administered 2018-06-01 – 2018-06-02 (×2): 100 mg via ORAL
  Filled 2018-06-01 (×2): qty 1

## 2018-06-01 MED ORDER — TRANEXAMIC ACID 1000 MG/10ML IV SOLN
1000.0000 mg | Freq: Once | INTRAVENOUS | Status: AC
  Administered 2018-06-01: 1000 mg via INTRAVENOUS
  Filled 2018-06-01: qty 1000

## 2018-06-01 MED ORDER — METOCLOPRAMIDE HCL 5 MG PO TABS: 5.0000 mg | ORAL_TABLET | Freq: Three times a day (TID) | ORAL | Status: DC | PRN

## 2018-06-01 MED ORDER — GABAPENTIN 300 MG PO CAPS
300.0000 mg | ORAL_CAPSULE | Freq: Three times a day (TID) | ORAL | Status: DC
  Administered 2018-06-01 – 2018-06-02 (×3): 300 mg via ORAL
  Filled 2018-06-01 (×3): qty 1

## 2018-06-01 MED ORDER — ONDANSETRON HCL 4 MG/2ML IJ SOLN
INTRAMUSCULAR | Status: DC | PRN
  Administered 2018-06-01: 4 mg via INTRAVENOUS

## 2018-06-01 MED ORDER — EPHEDRINE 5 MG/ML INJ
INTRAVENOUS | Status: AC
  Filled 2018-06-01: qty 10

## 2018-06-01 MED ORDER — GABAPENTIN 300 MG PO CAPS
300.0000 mg | ORAL_CAPSULE | Freq: Once | ORAL | Status: AC
  Administered 2018-06-01: 300 mg via ORAL
  Filled 2018-06-01: qty 1

## 2018-06-01 MED ORDER — FENTANYL CITRATE (PF) 100 MCG/2ML IJ SOLN
INTRAMUSCULAR | Status: DC | PRN
  Administered 2018-06-01 (×2): 50 ug via INTRAVENOUS

## 2018-06-01 MED ORDER — SENNOSIDES-DOCUSATE SODIUM 8.6-50 MG PO TABS: 1.0000 | ORAL_TABLET | Freq: Every evening | ORAL | Status: DC | PRN

## 2018-06-01 MED ORDER — METOCLOPRAMIDE HCL 5 MG/ML IJ SOLN: 5.0000 mg | Freq: Three times a day (TID) | INTRAMUSCULAR | Status: DC | PRN

## 2018-06-01 MED ORDER — CEFAZOLIN SODIUM-DEXTROSE 2-4 GM/100ML-% IV SOLN
2.0000 g | Freq: Four times a day (QID) | INTRAVENOUS | Status: AC
  Administered 2018-06-01 (×2): 2 g via INTRAVENOUS
  Filled 2018-06-01 (×2): qty 100

## 2018-06-01 MED ORDER — FENTANYL CITRATE (PF) 100 MCG/2ML IJ SOLN: 25.0000 ug | INTRAMUSCULAR | Status: DC | PRN

## 2018-06-01 MED ORDER — ACETAMINOPHEN 500 MG PO TABS
1000.0000 mg | ORAL_TABLET | Freq: Once | ORAL | Status: AC
  Administered 2018-06-01: 1000 mg via ORAL
  Filled 2018-06-01: qty 2

## 2018-06-01 MED ORDER — DEXAMETHASONE SODIUM PHOSPHATE 10 MG/ML IJ SOLN
8.0000 mg | Freq: Once | INTRAMUSCULAR | Status: AC
  Administered 2018-06-01: 8 mg via INTRAVENOUS

## 2018-06-01 MED ORDER — ALUM & MAG HYDROXIDE-SIMETH 200-200-20 MG/5ML PO SUSP: 30.0000 mL | ORAL | Status: DC | PRN

## 2018-06-01 MED ORDER — SODIUM CHLORIDE 0.9 % IJ SOLN
INTRAMUSCULAR | Status: AC
  Filled 2018-06-01: qty 50

## 2018-06-01 MED ORDER — BISACODYL 5 MG PO TBEC: 5.0000 mg | DELAYED_RELEASE_TABLET | Freq: Every day | ORAL | Status: DC | PRN

## 2018-06-01 MED ORDER — OXYCODONE HCL 5 MG PO TABS
5.0000 mg | ORAL_TABLET | ORAL | Status: DC | PRN
  Administered 2018-06-01: 10 mg via ORAL
  Administered 2018-06-01: 5 mg via ORAL
  Administered 2018-06-02 (×2): 10 mg via ORAL
  Filled 2018-06-01 (×4): qty 2

## 2018-06-01 MED ORDER — PROPOFOL 10 MG/ML IV BOLUS
INTRAVENOUS | Status: AC
  Filled 2018-06-01: qty 60

## 2018-06-01 MED ORDER — TRAMADOL HCL 50 MG PO TABS
50.0000 mg | ORAL_TABLET | Freq: Four times a day (QID) | ORAL | Status: DC
  Administered 2018-06-01 – 2018-06-02 (×4): 50 mg via ORAL
  Filled 2018-06-01 (×4): qty 1

## 2018-06-01 MED ORDER — SODIUM CHLORIDE 0.9 % IR SOLN
Status: DC | PRN
  Administered 2018-06-01: 1000 mL

## 2018-06-01 MED ORDER — FENTANYL CITRATE (PF) 100 MCG/2ML IJ SOLN
INTRAMUSCULAR | Status: AC
  Filled 2018-06-01: qty 2

## 2018-06-01 MED ORDER — SODIUM CHLORIDE 0.9 % IJ SOLN
INTRAMUSCULAR | Status: AC
  Filled 2018-06-01: qty 20

## 2018-06-01 MED ORDER — DIPHENHYDRAMINE HCL 12.5 MG/5ML PO ELIX: 12.5000 mg | ORAL_SOLUTION | ORAL | Status: DC | PRN

## 2018-06-01 MED ORDER — SODIUM CHLORIDE 0.9 % IV SOLN
INTRAVENOUS | Status: DC
  Administered 2018-06-01 (×2): via INTRAVENOUS

## 2018-06-01 MED ORDER — CHLORHEXIDINE GLUCONATE 4 % EX LIQD: 60.0000 mL | Freq: Once | CUTANEOUS | Status: DC

## 2018-06-01 MED ORDER — DEXAMETHASONE SODIUM PHOSPHATE 10 MG/ML IJ SOLN
INTRAMUSCULAR | Status: AC
  Filled 2018-06-01: qty 1

## 2018-06-01 MED ORDER — HYDROMORPHONE HCL 1 MG/ML IJ SOLN: 0.5000 mg | INTRAMUSCULAR | Status: DC | PRN

## 2018-06-01 MED ORDER — DEXAMETHASONE SODIUM PHOSPHATE 10 MG/ML IJ SOLN
10.0000 mg | Freq: Once | INTRAMUSCULAR | Status: AC
  Administered 2018-06-02: 10 mg via INTRAVENOUS
  Filled 2018-06-01: qty 1

## 2018-06-01 MED ORDER — EPHEDRINE SULFATE-NACL 50-0.9 MG/10ML-% IV SOSY
PREFILLED_SYRINGE | INTRAVENOUS | Status: DC | PRN
  Administered 2018-06-01 (×2): 5 mg via INTRAVENOUS

## 2018-06-01 MED ORDER — SODIUM CHLORIDE 0.9% FLUSH
INTRAVENOUS | Status: DC | PRN
  Administered 2018-06-01: 20 mL

## 2018-06-01 MED ORDER — TRANEXAMIC ACID 1000 MG/10ML IV SOLN
1000.0000 mg | INTRAVENOUS | Status: AC
  Administered 2018-06-01: 1000 mg via INTRAVENOUS
  Filled 2018-06-01: qty 10

## 2018-06-01 MED ORDER — ONDANSETRON HCL 4 MG/2ML IJ SOLN: 4.0000 mg | Freq: Four times a day (QID) | INTRAMUSCULAR | Status: DC | PRN

## 2018-06-01 MED ORDER — PROPOFOL 500 MG/50ML IV EMUL
INTRAVENOUS | Status: DC | PRN
  Administered 2018-06-01: 100 ug/kg/min via INTRAVENOUS

## 2018-06-01 MED ORDER — FLEET ENEMA 7-19 GM/118ML RE ENEM: 1.0000 | ENEMA | Freq: Once | RECTAL | Status: DC | PRN

## 2018-06-01 SURGICAL SUPPLY — 65 items
ARTISURF 11M PLY L 6-9EF KNEE (Knees) ×2 IMPLANT
BAG SPEC THK2 15X12 ZIP CLS (MISCELLANEOUS) ×1
BAG ZIPLOCK 12X15 (MISCELLANEOUS) ×3 IMPLANT
BANDAGE ACE 6X5 VEL STRL LF (GAUZE/BANDAGES/DRESSINGS) ×3 IMPLANT
BLADE SAGITTAL 13X1.27X60 (BLADE) ×2 IMPLANT
BLADE SAGITTAL 13X1.27X60MM (BLADE) ×1
BLADE SAW SGTL 83.5X18.5 (BLADE) ×3 IMPLANT
BLADE SURG 15 STRL LF DISP TIS (BLADE) ×1 IMPLANT
BLADE SURG 15 STRL SS (BLADE) ×3
BOWL SMART MIX CTS (DISPOSABLE) ×3 IMPLANT
BSPLAT TIB 5D E CMNT STM LT (Knees) ×1 IMPLANT
CEMENT BONE SIMPLEX SPEEDSET (Cement) ×6 IMPLANT
CLOSURE WOUND 1/2 X4 (GAUZE/BANDAGES/DRESSINGS) ×1
COVER SURGICAL LIGHT HANDLE (MISCELLANEOUS) ×3 IMPLANT
CUFF TOURN SGL QUICK 34 (TOURNIQUET CUFF) ×3
CUFF TRNQT CYL 34X4X40X1 (TOURNIQUET CUFF) ×1 IMPLANT
DECANTER SPIKE VIAL GLASS SM (MISCELLANEOUS) ×6 IMPLANT
DRAPE INCISE IOBAN 66X45 STRL (DRAPES) ×6 IMPLANT
DRAPE U-SHAPE 47X51 STRL (DRAPES) ×3 IMPLANT
DRESSING AQUACEL AG SP 3.5X10 (GAUZE/BANDAGES/DRESSINGS) IMPLANT
DRSG AQUACEL AG ADV 3.5X10 (GAUZE/BANDAGES/DRESSINGS) ×3 IMPLANT
DRSG AQUACEL AG SP 3.5X10 (GAUZE/BANDAGES/DRESSINGS) ×3
DURAPREP 26ML APPLICATOR (WOUND CARE) ×6 IMPLANT
ELECT REM PT RETURN 15FT ADLT (MISCELLANEOUS) ×3 IMPLANT
FACESHIELD WRAPAROUND (MASK) ×3 IMPLANT
FACESHIELD WRAPAROUND OR TEAM (MASK) IMPLANT
FEMUR  CMT CCR STD SZ9 L KNEE (Knees) ×2 IMPLANT
FEMUR CMT CCR STD SZ9 L KNEE (Knees) ×1 IMPLANT
FEMUR CMTD CCR STD SZ9 L KNEE (Knees) IMPLANT
GLOVE BIOGEL M STRL SZ7.5 (GLOVE) ×3 IMPLANT
GLOVE BIOGEL PI IND STRL 7.0 (GLOVE) IMPLANT
GLOVE BIOGEL PI IND STRL 7.5 (GLOVE) ×1 IMPLANT
GLOVE BIOGEL PI IND STRL 8.5 (GLOVE) ×2 IMPLANT
GLOVE BIOGEL PI INDICATOR 7.0 (GLOVE) ×2
GLOVE BIOGEL PI INDICATOR 7.5 (GLOVE) ×4
GLOVE BIOGEL PI INDICATOR 8.5 (GLOVE) ×4
GLOVE SURG ORTHO 8.0 STRL STRW (GLOVE) ×9 IMPLANT
GLOVE SURG SS PI 7.0 STRL IVOR (GLOVE) ×2 IMPLANT
GLOVE SURG SS PI 7.5 STRL IVOR (GLOVE) ×2 IMPLANT
GOWN SPEC L3 XXLG W/TWL (GOWN DISPOSABLE) ×2 IMPLANT
GOWN STRL REUS W/ TWL XL LVL3 (GOWN DISPOSABLE) ×2 IMPLANT
GOWN STRL REUS W/TWL 2XL LVL3 (GOWN DISPOSABLE) ×3 IMPLANT
GOWN STRL REUS W/TWL LRG LVL3 (GOWN DISPOSABLE) ×2 IMPLANT
GOWN STRL REUS W/TWL XL LVL3 (GOWN DISPOSABLE) ×6
HANDPIECE INTERPULSE COAX TIP (DISPOSABLE) ×3
HOLDER FOLEY CATH W/STRAP (MISCELLANEOUS) ×3 IMPLANT
HOOD PEEL AWAY FLYTE STAYCOOL (MISCELLANEOUS) ×9 IMPLANT
MANIFOLD NEPTUNE II (INSTRUMENTS) ×3 IMPLANT
PACK TOTAL KNEE CUSTOM (KITS) ×3 IMPLANT
POSITIONER SURGICAL ARM (MISCELLANEOUS) ×3 IMPLANT
SET HNDPC FAN SPRY TIP SCT (DISPOSABLE) ×1 IMPLANT
STEM POLY PAT PLY 35M KNEE (Knees) ×2 IMPLANT
STEM TIBIA 5 DEG SZ E L KNEE (Knees) IMPLANT
STRIP CLOSURE SKIN 1/2X4 (GAUZE/BANDAGES/DRESSINGS) ×2 IMPLANT
SUT BONE WAX W31G (SUTURE) ×3 IMPLANT
SUT MNCRL AB 4-0 PS2 18 (SUTURE) ×3 IMPLANT
SUT STRATAFIX 0 PDS 27 VIOLET (SUTURE) ×3
SUT STRATAFIX PDS+ 0 24IN (SUTURE) ×3 IMPLANT
SUT VIC AB 1 CT1 36 (SUTURE) ×2 IMPLANT
SUTURE STRATFX 0 PDS 27 VIOLET (SUTURE) ×1 IMPLANT
SYR CONTROL 10ML LL (SYRINGE) ×6 IMPLANT
TIBIA STEM 5 DEG SZ E L KNEE (Knees) ×3 IMPLANT
TRAY FOLEY MTR SLVR 14FR STAT (SET/KITS/TRAYS/PACK) ×2 IMPLANT
WRAP KNEE MAXI GEL POST OP (GAUZE/BANDAGES/DRESSINGS) ×3 IMPLANT
YANKAUER SUCT BULB TIP 10FT TU (MISCELLANEOUS) ×3 IMPLANT

## 2018-06-01 NOTE — Anesthesia Procedure Notes (Signed)
Anesthesia Regional Block: Adductor canal block   Pre-Anesthetic Checklist: ,, timeout performed, Correct Patient, Correct Site, Correct Laterality, Correct Procedure,, site marked, risks and benefits discussed, Surgical consent,  Pre-op evaluation,  At surgeon's request and post-op pain management  Laterality: Left  Prep: chloraprep       Needles:  Injection technique: Single-shot  Needle Type: Echogenic Stimulator Needle     Needle Length: 10cm  Needle Gauge: 21     Additional Needles:   Procedures:,,,, ultrasound used (permanent image in chart),,,,  Narrative:  Start time: 06/01/2018 6:50 AM End time: 06/01/2018 7:00 AM Injection made incrementally with aspirations every 5 mL.  Performed by: Personally  Anesthesiologist: Leonides Grills, MD  Additional Notes: Functioning IV was confirmed and monitors were applied. A time-out was performed. Hand hygiene and sterile gloves were used. The thigh was placed in a frog-leg position and prepped in a sterile fashion. A 21ga P[place]unk echogenic stimulator needle was placed using ultrasound guidance.  Negative aspiration and negative test dose prior to incremental administration of local anesthetic. The patient tolerated the procedure well.

## 2018-06-01 NOTE — H&P (Signed)
Lisa Wheeler MRN:  [id number] DOB/SEX:  07-May-1951/female  CHIEF COMPLAINT:  Painful left Knee  HISTORY: Patient is a 67 y.o. female presented with a history of pain in the left knee. Onset of symptoms was gradual starting a few years ago with gradually worsening course since that time. Patient has been treated conservatively with over-the-counter NSAIDs and activity modification. Patient currently rates pain in the knee at 10 out of 10 with activity. There is pain at night.  PAST MEDICAL HISTORY: Patient Active Problem List   Diagnosis Date Noted  . S/P total knee replacement 02/23/2018  . Essential hypertension 08/08/2015  . Osteoporosis 06/15/2014   Past Medical History:  Diagnosis Date  . Arthritis   . Elevated lipids   . Hypertension   . Osteoarthritis of right knee    Severe  . Osteopenia    right hip   Past Surgical History:  Procedure Laterality Date  . CLOSED REDUCTION HAND FRACTURE    . OOPHORECTOMY     right  . TOTAL KNEE ARTHROPLASTY Right 02/23/2018   Procedure: TOTAL KNEE ARTHROPLASTY;  Surgeon: Dannielle Huh, MD;  Location: [facility] OR;  Service: Orthopedics;  Laterality: Right;  . TUBAL LIGATION  1985  . WRIST SURGERY  2/12     MEDICATIONS:   Medications Prior to Admission  Medication Sig Dispense Refill Last Dose  . acetaminophen (TYLENOL) 500 MG tablet Take 1,000 mg by mouth daily as needed for moderate pain or headache.   Past Month at Unknown time  . AZOR 10-40 MG per tablet Take 1 tablet by mouth daily.    05/31/2018 at Unknown time  . BIOTIN PO Take 2,000 mcg by mouth daily.   05/29/2018  . calcium-vitamin D (OSCAL WITH D) 500-200 MG-UNIT tablet Take 1 tablet by mouth daily.   05/29/2018  . cholecalciferol (VITAMIN D) 1000 units tablet Take 1,000 Units by mouth daily.   05/29/2018  . vitamin C (ASCORBIC ACID) 500 MG tablet Take 500 mg by mouth daily.   05/29/2018  . aspirin EC 325 MG EC tablet Take 1 tablet (325 mg total) by mouth 2 (two) times daily. (Patient  not taking: Reported on 05/22/2018) 30 tablet 0 Not Taking at Unknown time  . methocarbamol (ROBAXIN) 500 MG tablet Take 1 tablet (500 mg total) by mouth every 6 (six) hours as needed for muscle spasms. (Patient not taking: Reported on 05/22/2018) 60 tablet 0 Not Taking at Unknown time  . oxyCODONE (OXY IR/ROXICODONE) 5 MG immediate release tablet Take 1-2 tablets (5-10 mg total) by mouth every 6 (six) hours as needed for moderate pain (pain score 4-6). (Patient not taking: Reported on 05/22/2018) 50 tablet 0 Completed Course at Unknown time    ALLERGIES:   Allergies  Allergen Reactions  . Mercury     Migraines/ headaches due to dental fillings   . Methocarbamol     Dizziness, syncope   . Benzoin Rash    Makes skin peel    REVIEW OF SYSTEMS:  A comprehensive review of systems was negative except for: Musculoskeletal: positive for arthralgias and bone pain   FAMILY HISTORY:   Family History  Problem Relation Age of Onset  . Breast cancer Mother 8  . Stomach cancer Father   . Heart disease Father     SOCIAL HISTORY:   Social History   Tobacco Use  . Smoking status: Never Smoker  . Smokeless tobacco: Never Used  Substance Use Topics  . Alcohol use: No  EXAMINATION:  Vital signs in last 24 hours:    LMP 09/02/1998   General Appearance:    Alert, cooperative, no distress, appears stated age  Head:    Normocephalic, without obvious abnormality, atraumatic  Eyes:    PERRL, conjunctiva/corneas clear, EOM's intact, fundi    benign, both eyes  Ears:    Normal TM's and external ear canals, both ears  Nose:   Nares normal, septum midline, mucosa normal, no drainage    or sinus tenderness  Throat:   Lips, mucosa, and tongue normal; teeth and gums normal  Neck:   Supple, symmetrical, trachea midline, no adenopathy;    thyroid:  no enlargement/tenderness/nodules; no carotid   bruit or JVD  Back:     Symmetric, no curvature, ROM normal, no CVA tenderness  Lungs:     Clear to  auscultation bilaterally, respirations unlabored  Chest Wall:    No tenderness or deformity   Heart:    Regular rate and rhythm, S1 and S2 normal, no murmur, rub   or gallop  Breast Exam:    No tenderness, masses, or nipple abnormality  Abdomen:     Soft, non-tender, bowel sounds active all four quadrants,    no masses, no organomegaly  Genitalia:    Normal female without lesion, discharge or tenderness  Rectal:    Normal tone, no masses or tenderness;   guaiac negative stool  Extremities:   Extremities normal, atraumatic, no cyanosis or edema  Pulses:   2+ and symmetric all extremities  Skin:   Skin color, texture, turgor normal, no rashes or lesions  Lymph nodes:   Cervical, supraclavicular, and axillary nodes normal  Neurologic:   CNII-XII intact, normal strength, sensation and reflexes    throughout    Musculoskeletal:  ROM 0-120, Ligaments intact,  Imaging Review Plain radiographs demonstrate severe degenerative joint disease of the left knee. The overall alignment is neutral. The bone quality appears to be good for age and reported activity level.  Assessment/Plan: Primary osteoarthritis, left knee   The patient history, physical examination and imaging studies are consistent with advanced degenerative joint disease of the left knee. The patient has failed conservative treatment.  The clearance notes were reviewed.  After discussion with the patient it was felt that Total Knee Replacement was indicated. The procedure,  risks, and benefits of total knee arthroplasty were presented and reviewed. The risks including but not limited to aseptic loosening, infection, blood clots, vascular injury, stiffness, patella tracking problems complications among others were discussed. The patient acknowledged the explanation, agreed to proceed with the plan.  Preoperative templating of the joint replacement has been completed, documented, and submitted to the Operating Room personnel in order to  optimize intra-operative equipment management.    Patient's anticipated LOS is less than 2 midnights, meeting these requirements: - Lives within 1 hour of care - Has a competent adult at home to recover with post-op recover - NO history of  - Chronic pain requiring opiods  - Diabetes  - Coronary Artery Disease  - Heart failure  - Heart attack  - Stroke  - DVT/VTE  - Cardiac arrhythmia  - Respiratory Failure/COPD  - Renal failure  - Anemia  - Advanced Liver disease       Guy Sandifer 06/01/2018, 5:59 AM

## 2018-06-01 NOTE — Plan of Care (Signed)

## 2018-06-01 NOTE — Transfer of Care (Signed)
Immediate Anesthesia Transfer of Care Note  Patient: Lisa Wheeler  Procedure(s) Performed: LEFT TOTAL KNEE ARTHROPLASTY (Left Knee)  Patient Location: PACU  Anesthesia Type:Spinal and MAC combined with regional for post-op pain  Level of Consciousness: awake, alert , oriented and patient cooperative  Airway & Oxygen Therapy: Patient Spontanous Breathing and Patient connected to face mask oxygen  Post-op Assessment: Report given to RN and Post -op Vital signs reviewed and stable  Post vital signs: Reviewed and stable  Last Vitals:  Vitals Value Taken Time  BP    Temp    Pulse 80 06/01/2018  9:02 AM  Resp 14 06/01/2018  9:02 AM  SpO2 89 % 06/01/2018  9:02 AM  Vitals shown include unvalidated device data.  Last Pain:  Vitals:   06/01/18 0600  TempSrc: Oral  PainSc: 0-No pain      Patients Stated Pain Goal: 3 (95/63/87 5643)  Complications: No apparent anesthesia complications

## 2018-06-01 NOTE — Anesthesia Postprocedure Evaluation (Signed)
Anesthesia Post Note  Patient: [name]  Procedure(s) Performed: LEFT TOTAL KNEE ARTHROPLASTY (Left Knee)     Patient location during evaluation: PACU Anesthesia Type: Regional and General Level of consciousness: awake and alert Pain management: pain level controlled Vital Signs Assessment: post-procedure vital signs reviewed and stable Respiratory status: spontaneous breathing, nonlabored ventilation, respiratory function stable and patient connected to nasal cannula oxygen Cardiovascular status: blood pressure returned to baseline and stable Postop Assessment: no apparent nausea or vomiting Anesthetic complications: no    Last Vitals:  Vitals:   06/01/18 1256 06/01/18 1351  BP: 134/69 138/76  Pulse: 66 67  Resp: 14 14  Temp: 36.7 C 36.8 C  SpO2: 99% 99%    Last Pain:  Vitals:   06/01/18 1351  TempSrc: Oral  PainSc:                  Ryan P Ellender

## 2018-06-01 NOTE — Evaluation (Signed)
Physical Therapy Evaluation Patient Details Name: Lisa Wheeler MRN: [id number] DOB: April 03, 1951 Today's Date: 06/01/2018   History of Present Illness  Pt is a 67 y/o female s/p L TKR on 06/01/18. PMH includes HTN, osteoporosis, R TKR 01/2018, wrist fracture and surgery 2012.  Clinical Impression   Pt presents with L knee pain, decreased L foot sensation, difficulty performing mobility tasks, and decreased tolerance for ambulation. PT to progress mobility as able. Pt ambulated room distance with RW today, reporting that she was not comfortable ambulating farther due to altered sensation in L foot. PT to progress mobility as able, and will continue to follow acutely.     Follow Up Recommendations Follow surgeon's recommendation for DC plan and follow-up therapies;Supervision for mobility/OOB(OPPT)    Equipment Recommendations  None recommended by PT    Recommendations for Other Services       Precautions / Restrictions Precautions Precautions: Fall Restrictions Weight Bearing Restrictions: No Other Position/Activity Restrictions: WBAT       Mobility  Bed Mobility Overal bed mobility: Needs Assistance Bed Mobility: Supine to Sit     Supine to sit: Min guard;HOB elevated     General bed mobility comments: Min guard for safety, increased time/effort to perform   Transfers Overall transfer level: Needs assistance Equipment used: Rolling walker (2 wheeled) Transfers: Sit to/from Stand Sit to Stand: Min assist;From elevated surface         General transfer comment: Min assist for standing completely upright and steadying upon standing. Pt reporting her L foot felt like it was "asleep" in standing, but wanted to ambulate room distance.   Ambulation/Gait Ambulation/Gait assistance: Min guard Gait Distance (Feet): 10 Feet Assistive device: Rolling walker (2 wheeled) Gait Pattern/deviations: Step-to pattern;Decreased stride length;Decreased weight shift to left;Decreased  stance time - left;Antalgic Gait velocity: decr    General Gait Details: Pt with decreased light touch sensation in L foot limiting mobility today. Pt ambulated with min guard for safety  Stairs            Wheelchair Mobility    Modified Rankin (Stroke Patients Only)       Balance Overall balance assessment: Mild deficits observed, not formally tested                                           Pertinent Vitals/Pain Pain Assessment: 0-10 Pain Score: 6  Pain Location: L knee  Pain Descriptors / Indicators: Sore Pain Intervention(s): Limited activity within patient's tolerance;Monitored during session;Ice applied;Repositioned;Premedicated before session    Home Living Family/patient expects to be discharged to:: Private residence Living Arrangements: Spouse/significant other Available Help at Discharge: Family;Available 24 hours/day Type of Home: House Home Access: Stairs to enter Entrance Stairs-Rails: None Entrance Stairs-Number of Steps: 2 Home Layout: One level Home Equipment: Walker - 2 wheels;Other (comment);Cane - single point(CPM)      Prior Function Level of Independence: Independent               Hand Dominance   Dominant Hand: Right    Extremity/Trunk Assessment   Upper Extremity Assessment Upper Extremity Assessment: Overall WFL for tasks assessed    Lower Extremity Assessment Lower Extremity Assessment: Overall WFL for tasks assessed;LLE deficits/detail LLE Deficits / Details: Suspected post-surgical weakness; able to perform quad set, ankle pumps LLE Sensation: WNL(WNL with sensation screen in supine, decreased light touch L foot when standing)  Cervical / Trunk Assessment Cervical / Trunk Assessment: Normal  Communication   Communication: No difficulties  Cognition Arousal/Alertness: Awake/alert Behavior During Therapy: WFL for tasks assessed/performed Overall Cognitive Status: Within Functional Limits for tasks  assessed                                        General Comments      Exercises Total Joint Exercises Ankle Circles/Pumps: AROM;Both;5 reps;Seated   Assessment/Plan    PT Assessment Patient needs continued PT services  PT Problem List Decreased strength;Pain;Decreased range of motion;Decreased activity tolerance;Decreased knowledge of use of DME;Decreased balance;Decreased safety awareness;Decreased mobility       PT Treatment Interventions DME instruction;Therapeutic activities;Gait training;Therapeutic exercise;Patient/family education;Stair training;Balance training;Functional mobility training    PT Goals (Current goals can be found in the Care Plan section)  Acute Rehab PT Goals PT Goal Formulation: With patient Time For Goal Achievement: 06/15/18 Potential to Achieve Goals: Good    Frequency 7X/week   Barriers to discharge        Co-evaluation               AM-PAC PT "6 Clicks" Daily Activity  Outcome Measure Difficulty turning over in bed (including adjusting bedclothes, sheets and blankets)?: Unable Difficulty moving from lying on back to sitting on the side of the bed? : Unable Difficulty sitting down on and standing up from a chair with arms (e.g., wheelchair, bedside commode, etc,.)?: Unable Help needed moving to and from a bed to chair (including a wheelchair)?: A Little Help needed walking in hospital room?: A Little Help needed climbing 3-5 steps with a railing? : A Little 6 Click Score: 12    End of Session Equipment Utilized During Treatment: Gait belt Activity Tolerance: Patient limited by pain;Other (comment)(limited by decreased L foot sensation) Patient left: in chair;with chair alarm set;with SCD's reapplied;with call bell/phone within reach(with bone foam applied ) Nurse Communication: Mobility status PT Visit Diagnosis: Other abnormalities of gait and mobility (R26.89);Difficulty in walking, not elsewhere classified  (R26.2)    Time: 9147-8295 PT Time Calculation (min) (ACUTE ONLY): 26 min   Charges:   PT Evaluation $PT Eval Low Complexity: 1 Low PT Treatments $Therapeutic Activity: 8-22 mins       Zaccheaus Storlie Terrial Rhodes, PT Acute Rehabilitation Services Pager [phone]  Office [phone]   Jayleigh Notarianni D Kamarii Buren 06/01/2018, 6:08 PM

## 2018-06-01 NOTE — Anesthesia Procedure Notes (Signed)
Spinal  Patient location during procedure: OR Start time: 06/01/2018 7:24 AM End time: 06/01/2018 7:29 AM Reason for block: at surgeon's request Staffing Resident/CRNA: Anne Fu, CRNA Performed: resident/CRNA  Preanesthetic Checklist Completed: patient identified, site marked, surgical consent, pre-op evaluation, timeout performed, IV checked, risks and benefits discussed and monitors and equipment checked Spinal Block Patient position: sitting Prep: DuraPrep Patient monitoring: heart rate, continuous pulse ox and blood pressure Approach: right paramedian Location: L2-3 Injection technique: single-shot Needle Needle type: Pencan  Needle gauge: 24 G Needle length: 9 cm Assessment Sensory level: T6 Additional Notes  Functioning IV was confirmed and monitors were applied. Expiration date of kit checked and confirmed. Sterile prep and drape, including hand hygiene and sterile gloves were used. The patient was positioned and the spine was prepped. The skin was anesthetized with lidocaine.  Free flow of clear CSF was obtained prior to injecting local anesthetic into the CSF X 1 attempt.  The spinal needle aspirated freely following injection.  The needle was carefully withdrawn. Patient tolerated procedure well, without complications. Loss of motor and sensory on exam post injection.

## 2018-06-02 ENCOUNTER — Encounter (HOSPITAL_COMMUNITY): Payer: Self-pay | Admitting: Orthopedic Surgery

## 2018-06-02 DIAGNOSIS — M1712 Unilateral primary osteoarthritis, left knee: Secondary | ICD-10-CM | POA: Diagnosis not present

## 2018-06-02 LAB — BASIC METABOLIC PANEL
ANION GAP: 5 (ref 5–15)
BUN: 12 mg/dL (ref 8–23)
CALCIUM: 8.9 mg/dL (ref 8.9–10.3)
CO2: 27 mmol/L (ref 22–32)
Chloride: 107 mmol/L (ref 98–111)
Creatinine, Ser: 0.79 mg/dL (ref 0.44–1.00)
GFR calc Af Amer: >60 mL/min (ref >60)
GFR calc non Af Amer: >60 mL/min (ref >60)
GLUCOSE: 138 mg/dL — AB (ref 70–99)
Potassium: 4.6 mmol/L (ref 3.5–5.1)
Sodium: 139 mmol/L (ref 135–145)

## 2018-06-02 LAB — CBC
HEMATOCRIT: 34.3 % — AB (ref 36.0–46.0)
Hemoglobin: 11.2 g/dL — ABNORMAL LOW (ref 12.0–15.0)
MCH: 28.6 pg (ref 26.0–34.0)
MCHC: 32.7 g/dL (ref 30.0–36.0)
MCV: 87.7 fL (ref 78.0–100.0)
Platelets: 194 10*3/uL (ref 150–400)
RBC: 3.91 MIL/uL (ref 3.87–5.11)
RDW: 13.2 % (ref 11.5–15.5)
WBC: 12 10*3/uL — AB (ref 4.0–10.5)

## 2018-06-02 MED ORDER — OXYCODONE HCL 5 MG PO TABS: 5.0000 mg | ORAL_TABLET | Freq: Four times a day (QID) | ORAL | 0 refills | Status: DC | PRN

## 2018-06-02 MED ORDER — ASPIRIN 325 MG PO TBEC: 325.0000 mg | DELAYED_RELEASE_TABLET | Freq: Two times a day (BID) | ORAL | 0 refills | Status: DC

## 2018-06-02 NOTE — Progress Notes (Signed)
Physical Therapy Treatment Patient Details Name: Lisa Wheeler MRN: [id number] DOB: 12-05-1950 Today's Date: 06/02/2018    History of Present Illness Pt is a 67 y/o female s/p L TKR on 06/01/18. PMH includes HTN, osteoporosis, R TKR 01/2018, wrist fracture and surgery 2012.    PT Comments    POD # 1 pm session Assisted with amb a second time with increased self ability and practiced stairs.  Pt is very knowlegable as this is her second TKR.  Pt has met goals to D/C to home.   Follow Up Recommendations  Follow surgeon's recommendation for DC plan and follow-up therapies;Supervision for mobility/OOB     Equipment Recommendations  None recommended by PT    Recommendations for Other Services       Precautions / Restrictions Precautions Precautions: Fall Restrictions Weight Bearing Restrictions: No Other Position/Activity Restrictions: WBAT     Mobility  Bed Mobility Overal bed mobility: Needs Assistance Bed Mobility: Supine to Sit     Supine to sit: Supervision;Min guard     General bed mobility comments: assist L LE and increased time  Transfers Overall transfer level: Needs assistance Equipment used: Rolling walker (2 wheeled) Transfers: Sit to/from Stand Sit to Stand: Supervision;Min guard         General transfer comment: 25% VC's on safety with turns assisted to bathroom  Ambulation/Gait Ambulation/Gait assistance: Supervision;Min guard Gait Distance (Feet): 55 Feet Assistive device: Rolling walker (2 wheeled) Gait Pattern/deviations: Step-to pattern;Decreased stride length;Decreased weight shift to left;Decreased stance time - left;Antalgic Gait velocity: decreased   General Gait Details: tolerated an increased distance no longer c/o foot numbness.     Stairs Stairs: Yes Stairs assistance: Supervision Stair Management: Step to pattern;Forwards Number of Stairs: 2 General stair comments: one VC on safety with sequencing otherwised performed  well   Wheelchair Mobility    Modified Rankin (Stroke Patients Only)       Balance                                            Cognition   Behavior During Therapy: WFL for tasks assessed/performed Overall Cognitive Status: Within Functional Limits for tasks assessed                                        Exercises      General Comments        Pertinent Vitals/Pain Pain Assessment: 0-10 Pain Score: 4  Pain Location: L knee  Pain Descriptors / Indicators: Sore;Operative site guarding Pain Intervention(s): Monitored during session;Repositioned;Ice applied    Home Living                      Prior Function            PT Goals (current goals can now be found in the care plan section) Progress towards PT goals: Progressing toward goals    Frequency    7X/week      PT Plan Current plan remains appropriate    Co-evaluation              AM-PAC PT "6 Clicks" Daily Activity  Outcome Measure  Difficulty turning over in bed (including adjusting bedclothes, sheets and blankets)?: A Little Difficulty moving from lying on back to sitting on  the side of the bed? : A Little Difficulty sitting down on and standing up from a chair with arms (e.g., wheelchair, bedside commode, etc,.)?: A Little Help needed moving to and from a bed to chair (including a wheelchair)?: A Little Help needed walking in hospital room?: A Little Help needed climbing 3-5 steps with a railing? : A Lot 6 Click Score: 17    End of Session Equipment Utilized During Treatment: Gait belt Activity Tolerance: Patient tolerated treatment well Patient left: in chair;with chair alarm set;with SCD's reapplied;with call bell/phone within reach Nurse Communication: Mobility status PT Visit Diagnosis: Other abnormalities of gait and mobility (R26.89);Difficulty in walking, not elsewhere classified (R26.2)     Time: 1350-1415 PT Time Calculation (min)  (ACUTE ONLY): 25 min  Charges:  $Gait Training: 8-22 mins $Therapeutic Activity: 8-22 mins                     Rica Koyanagi  PTA Acute  Rehabilitation Services Pager      [phone] Office      [phone]

## 2018-06-02 NOTE — Progress Notes (Signed)
Physical Therapy Treatment Patient Details Name: Lisa Wheeler MRN: [id number] DOB: [birth year] Today's Date: 06/02/2018    History of Present Illness Pt is a 67 y/o female s/p L TKR on 06/01/18. PMH includes HTN, osteoporosis, R TKR 01/2018, wrist fracture and surgery 2012.    PT Comments    POD # 1 am session Assisted OOB to bathroom then amb a greater distance in hallway.  No longer c/o foot numbness.  Returned to room to perform some TKR TE's followed by CPM application 0 - 50 degrees vs 0 - 90 degrees due to increase stiffness/pain.    Follow Up Recommendations  Follow surgeon's recommendation for DC plan and follow-up therapies;Supervision for mobility/OOB     Equipment Recommendations  None recommended by PT    Recommendations for Other Services       Precautions / Restrictions Precautions Precautions: Fall Restrictions Weight Bearing Restrictions: No Other Position/Activity Restrictions: WBAT     Mobility  Bed Mobility Overal bed mobility: Needs Assistance Bed Mobility: Supine to Sit     Supine to sit: Supervision;Min guard     General bed mobility comments: assist L LE and increased time  Transfers Overall transfer level: Needs assistance Equipment used: Rolling walker (2 wheeled) Transfers: Sit to/from Stand Sit to Stand: Supervision;Min guard         General transfer comment: 25% VC's on safety with turns assisted to bathroom  Ambulation/Gait Ambulation/Gait assistance: Supervision;Min guard Gait Distance (Feet): 55 Feet Assistive device: Rolling walker (2 wheeled) Gait Pattern/deviations: Step-to pattern;Decreased stride length;Decreased weight shift to left;Decreased stance time - left;Antalgic Gait velocity: decreased   General Gait Details: tolerated an increased distance no longer c/o foot numbness.     Stairs             Wheelchair Mobility    Modified Rankin (Stroke Patients Only)       Balance                                             Cognition   Behavior During Therapy: WFL for tasks assessed/performed Overall Cognitive Status: Within Functional Limits for tasks assessed                                        Exercises   Total Knee Replacement TE's 10 reps B LE ankle pumps 10 reps towel squeezes 10 reps knee presses 10 reps heel slides  05 reps SLR's 05reps ABD Followed by ICE     General Comments        Pertinent Vitals/Pain Pain Assessment: 0-10 Pain Score: 4  Pain Location: L knee  Pain Descriptors / Indicators: Sore;Operative site guarding Pain Intervention(s): Monitored during session;Repositioned;Ice applied    Home Living                      Prior Function            PT Goals (current goals can now be found in the care plan section) Progress towards PT goals: Progressing toward goals    Frequency    7X/week      PT Plan Current plan remains appropriate    Co-evaluation              AM-PAC PT "6 Clicks" Daily  Activity  Outcome Measure  Difficulty turning over in bed (including adjusting bedclothes, sheets and blankets)?: A Little Difficulty moving from lying on back to sitting on the side of the bed? : A Little Difficulty sitting down on and standing up from a chair with arms (e.g., wheelchair, bedside commode, etc,.)?: A Little Help needed moving to and from a bed to chair (including a wheelchair)?: A Little Help needed walking in hospital room?: A Little Help needed climbing 3-5 steps with a railing? : A Lot 6 Click Score: 17    End of Session Equipment Utilized During Treatment: Gait belt Activity Tolerance: Patient tolerated treatment well Patient left: in chair;with chair alarm set;with SCD's reapplied;with call bell/phone within reach Nurse Communication: Mobility status PT Visit Diagnosis: Other abnormalities of gait and mobility (R26.89);Difficulty in walking, not elsewhere classified (R26.2)      Time: 1025-1050 PT Time Calculation (min) (ACUTE ONLY): 25 min  Charges:  $Gait Training: 8-22 mins $Therapeutic Exercise: 8-22 mins                     Felecia Shelling  PTA Acute  Rehabilitation Services Pager      [phone] Office      [phone]

## 2018-06-02 NOTE — Care Management Obs Status (Signed)
MEDICARE OBSERVATION STATUS NOTIFICATION   Patient Details  Name: Lisa Wheeler MRN: [id number] Date of Birth: Jul 25, 1951   Medicare Observation Status Notification Given:  Yes    Alexis Goodell, RN 06/02/2018, 2:11 PM

## 2018-06-02 NOTE — Progress Notes (Signed)
Respiratory Therapist entered room stating pt's O2 saturation was at 86% on room air. Student Nurse then entered and discovered the same. Lifted the head of bed, then placed pt on 2 L of O2. This resulted in a rise of the pt's O2 saturation to 95 %. Notified RN.

## 2018-06-02 NOTE — Op Note (Signed)
TOTAL KNEE REPLACEMENT OPERATIVE NOTE:  06/01/2018  3:41 PM  PATIENT:  Lisa Wheeler  67 y.o. female  PRE-OPERATIVE DIAGNOSIS:  Osteoarthritis LT. Knee  POST-OPERATIVE DIAGNOSIS:  Osteoarthritis LT. Knee  PROCEDURE:  Procedure(s): LEFT TOTAL KNEE ARTHROPLASTY  SURGEON:  Surgeon(s): Dannielle Huh, MD  PHYSICIAN ASSISTANT: Laurier Nancy, PA-C   ANESTHESIA:   spinal  SPECIMEN: None  COUNTS:  Correct  TOURNIQUET:   Total Tourniquet Time Documented: Thigh (Left) - 41 minutes Total: Thigh (Left) - 41 minutes   DICTATION:  Indication for procedure:    The patient is a 67 y.o. female who has failed conservative treatment for Osteoarthritis LT. Knee.  Informed consent was obtained prior to anesthesia. The risks versus benefits of the operation were explain and in a way the patient can, and did, understand.   On the implant demand matching protocol, this patient scored 8.  Therefore, this patient was not receive a polyethylene insert with vitamin E which is a high demand implant.  Description of procedure:     The patient was taken to the operating room and placed under anesthesia.  The patient was positioned in the usual fashion taking care that all body parts were adequately padded and/or protected.  A tourniquet was applied and the leg prepped and draped in the usual sterile fashion.  The extremity was exsanguinated with the esmarch and tourniquet inflated to 350 mmHg.  Pre-operative range of motion was normal.  The knee was in 10 degree of mild varus.  A midline incision approximately 6-7 inches long was made with a #10 blade.  A new blade was used to make a parapatellar arthrotomy going 2-3 cm into the quadriceps tendon, over the patella, and alongside the medial aspect of the patellar tendon.  A synovectomy was then performed with the #10 blade and forceps. I then elevated the deep MCL off the medial tibial metaphysis subperiosteally around to the semimembranosus attachment.     I everted the patella and used calipers to measure patellar thickness.  I used the reamer to ream down to appropriate thickness to recreate the native thickness.  I then removed excess bone with the rongeur and sagittal saw.  I used the appropriately sized template and drilled the three lug holes.  I then put the trial in place and measured the thickness with the calipers to ensure recreation of the native thickness.  The trial was then removed and the patella subluxed and the knee brought into flexion.  A homan retractor was place to retract and protect the patella and lateral structures.  A Z-retractor was place medially to protect the medial structures.  The extra-medullary alignment system was used to make cut the tibial articular surface perpendicular to the anamotic axis of the tibia and in 3 degrees of posterior slope.  The cut surface and alignment jig was removed.  I then used the intramedullary alignment guide to make a 5 valgus cut on the distal femur.  I then marked out the epicondylar axis on the distal femur.  The posterior condylar axis measured 3 degrees.  I then used the anterior referencing sizer and measured the femur to be a size 9.  The 4-In-1 cutting block was screwed into place in external rotation matching the posterior condylar angle, making our cuts perpendicular to the epicondylar axis.  Anterior, posterior and chamfer cuts were made with the sagittal saw.  The cutting block and cut pieces were removed.  A lamina spreader was placed in 90 degrees of  flexion.  The ACL, PCL, menisci, and posterior condylar osteophytes were removed.  A 11 mm spacer blocked was found to offer good flexion and extension gap balance after mild in degree releasing.   The scoop retractor was then placed and the femoral finishing block was pinned in place.  The small sagittal saw was used as well as the lug drill to finish the femur.  The block and cut surfaces were removed and the medullary canal hole  filled with autograft bone from the cut pieces.  The tibia was delivered forward in deep flexion and external rotation.  A size E tray was selected and pinned into place centered on the medial 1/3 of the tibial tubercle.  The reamer and keel was used to prepare the tibia through the tray.    I then trialed with the size 9 femur, size E tibia, a 11 mm insert and the 35 patella.  I had excellent flexion/extension gap balance, excellent patella tracking.  Flexion was full and beyond 120 degrees; extension was zero.  These components were chosen and the staff opened them to me on the back table while the knee was lavaged copiously and the cement mixed.  The soft tissue was infiltrated with 60cc of exparel 1.3% through a 21 gauge needle.  I cemented in the components and removed all excess cement.  The polyethylene tibial component was snapped into place and the knee placed in extension while cement was hardening.  The capsule was infilltrated with a 60cc exparel/marcaine/saline mixture.   Once the cement was hard, the tourniquet was let down.  Hemostasis was obtained.  The arthrotomy was closed using a #1 stratofix running suture.  The deep soft tissues were closed with #0 vicryls and the subcuticular layer closed with #2-0 vicryl.  The skin was reapproximated and closed with 3.0 Monocryl.  The wound was covered with steristrips, aquacel dressing, and a TED stocking.   The patient was then awakened, extubated, and taken to the recovery room in stable condition.  BLOOD LOSS:  300cc COMPLICATIONS:  None.  PLAN OF CARE: Admit for overnight observation  PATIENT DISPOSITION:  PACU - hemodynamically stable.   Delay start of Pharmacological VTE agent (>24hrs) due to surgical blood loss or risk of bleeding:  not applicable  Please fax a copy of this op note to my office at [phone] (please only include page 1 and 2 of the Case Information op note)

## 2018-06-02 NOTE — Progress Notes (Signed)
SPORTS MEDICINE AND JOINT REPLACEMENT  Georgena Spurling, MD    Laurier Nancy, PA-C [address] [place], [place], [place]  [address]                             [phone]   PROGRESS NOTE  Subjective:  negative for Chest Pain  negative for Shortness of Breath  negative for Nausea/Vomiting   negative for Calf Pain  negative for Bowel Movement   Tolerating Diet: yes         Patient reports pain as 4 on 0-10 scale.    Objective: Vital signs in last 24 hours:    Patient Vitals for the past 24 hrs:  BP Temp Temp src Pulse Resp SpO2  06/02/18 0615 (!) 158/84 98 F (36.7 C) Oral 67 18 96 %  06/02/18 0025 136/73 97.8 F (36.6 C) Oral 64 16 99 %  06/01/18 2143 139/78 97.6 F (36.4 C) Oral 60 17 98 %  06/01/18 1721 134/77 97.7 F (36.5 C) Oral 67 18 99 %  06/01/18 1351 138/76 98.2 F (36.8 C) Oral 67 14 99 %  06/01/18 1256 134/69 98.1 F (36.7 C) Oral 66 14 99 %  06/01/18 1155 (!) 143/80 (!) 97.5 F (36.4 C) Oral 83 14 99 %  06/01/18 1045 (!) 147/79 97.7 F (36.5 C) Oral 78 14 99 %  06/01/18 1000 (!) 148/72 - - 75 11 98 %  06/01/18 0945 140/68 - - 70 12 94 %  06/01/18 0930 (!) 145/72 - - 75 16 97 %  06/01/18 0915 133/73 - - 80 12 97 %  06/01/18 0902 125/61 (!) 97.5 F (36.4 C) - 80 14 92 %    @flow {1959:LAST@   Intake/Output from previous day:   09/30 0701 - 10/01 0700 In: 2704.5 [P.O.:520; I.V.:2078.2] Out: 2000 [Urine:2000]   Intake/Output this shift:   09/30 1901 - 10/01 0700 In: 280 [P.O.:280] Out: 650 [Urine:650]   Intake/Output      09/30 0701 - 10/01 0700   P.O. 520   I.V. (mL/kg) 2078.2 (25.6)   IV Piggyback 106.4   Total Intake(mL/kg) 2704.5 (33.3)   Urine (mL/kg/hr) 2000 (1)   Total Output 2000   Net +704.5          LABORATORY DATA: Recent Labs    05/28/18 0959 06/02/18 0529  WBC 6.2 12.0*  HGB 15.0 11.2*  HCT 46.6* 34.3*  PLT 254 194   Recent Labs    05/28/18 0959 06/02/18 0529  NA 138 139  K 5.0 4.6  CL 105 107  CO2 26 27  BUN  13 12  CREATININE 0.85 0.79  GLUCOSE 107* 138*  CALCIUM 9.3 8.9   No results found for: INR, PROTIME  Examination:  General appearance: alert, cooperative and no distress Extremities: extremities normal, atraumatic, no cyanosis or edema  Wound Exam: clean, dry, intact   Drainage:  None: wound tissue dry  Motor Exam: Quadriceps and Hamstrings Intact  Sensory Exam: Superficial Peroneal, Deep Peroneal and Tibial normal   Assessment:    1 Day Post-Op  Procedure(s) (LRB): LEFT TOTAL KNEE ARTHROPLASTY (Left)  ADDITIONAL DIAGNOSIS:  Active Problems:   S/P total knee replacement     Plan: Physical Therapy as ordered Weight Bearing as Tolerated (WBAT)  DVT Prophylaxis:  Aspirin  DISCHARGE PLAN: Home  DISCHARGE NEEDS: HHPT     Patient doing great. Expected D/C home today  Patient's anticipated LOS is less than  2 midnights, meeting these requirements: - Lives within 1 hour of care - Has a competent adult at home to recover with post-op recover - NO history of  - Chronic pain requiring opiods  - Diabetes  - Coronary Artery Disease  - Heart failure  - Heart attack  - Stroke  - DVT/VTE  - Cardiac arrhythmia  - Respiratory Failure/COPD  - Renal failure  - Anemia  - Advanced Liver disease        Guy Sandifer 06/02/2018, 6:59 AM

## 2018-06-02 NOTE — Progress Notes (Signed)
Patient discharged to home w/ family. Given all belongings, instructions, prescriptions, equipment at home. Patient and husband verbalized understanding of instructions. Escorted to pov via w/c.

## 2018-06-02 NOTE — Discharge Summary (Signed)
SPORTS MEDICINE & JOINT REPLACEMENT   Georgena Spurling, MD   Laurier Nancy, PA-C [address] [place], [place], [place]  [address]                             [phone]  PATIENT ID: Lisa Wheeler        MRN:  [id number]          DOB/AGE: 09/06/1950 / 67 y.o.    DISCHARGE SUMMARY  ADMISSION DATE:    06/01/2018 DISCHARGE DATE:   06/02/2018   ADMISSION DIAGNOSIS: Osteoarthritis LT. Knee    DISCHARGE DIAGNOSIS:  Osteoarthritis LT. Knee    ADDITIONAL DIAGNOSIS: Active Problems:   S/P total knee replacement  Past Medical History:  Diagnosis Date  . Arthritis   . Elevated lipids   . Hypertension   . Osteoarthritis of right knee    Severe  . Osteopenia    right hip    PROCEDURE: Procedure(s): LEFT TOTAL KNEE ARTHROPLASTY on 06/01/2018  CONSULTS:    HISTORY:  See H&P in chart  HOSPITAL COURSE:  Lisa Wheeler is a 67 y.o. admitted on 06/01/2018 and found to have a diagnosis of Osteoarthritis LT. Knee.  After appropriate laboratory studies were obtained  they were taken to the operating room on 06/01/2018 and underwent Procedure(s): LEFT TOTAL KNEE ARTHROPLASTY.   They were given perioperative antibiotics:  Anti-infectives (From admission, onward)   Start     Dose/Rate Route Frequency Ordered Stop   06/01/18 1400  ceFAZolin (ANCEF) IVPB 2g/100 mL premix     2 g 200 mL/hr over 30 Minutes Intravenous Every 6 hours 06/01/18 1024 06/01/18 2107   06/01/18 0600  ceFAZolin (ANCEF) IVPB 2g/100 mL premix     2 g 200 mL/hr over 30 Minutes Intravenous On call to O.R. 06/01/18 2130 06/01/18 0732    .  Patient given tranexamic acid IV or topical and exparel intra-operatively.  Tolerated the procedure well.    POD# 1: Vital signs were stable.  Patient denied Chest pain, shortness of breath, or calf pain.  Patient was started on Aspirin twice daily at 8am.  Consults to PT, OT, and care management were made.  The patient was weight bearing as tolerated.  CPM was placed on the  operative leg 0-90 degrees for 6-8 hours a day. When out of the CPM, patient was placed in the foam block to achieve full extension. Incentive spirometry was taught.  Dressing was changed.       POD #2, Continued  PT for ambulation and exercise program.  IV saline locked.  O2 discontinued.    The remainder of the hospital course was dedicated to ambulation and strengthening.   The patient was discharged on 1 Day Post-Op in  Good condition.  Blood products given:none  DIAGNOSTIC STUDIES: Recent vital signs:  Patient Vitals for the past 24 hrs:  BP Temp Temp src Pulse Resp SpO2  06/02/18 1232 - - - - - 95 %  06/02/18 1231 - - - - - 94 %  06/02/18 1230 - - - - - (!) 86 %  06/02/18 0910 126/67 98.1 F (36.7 C) Oral 74 - 97 %  06/02/18 0615 (!) 158/84 98 F (36.7 C) Oral 67 18 96 %  06/02/18 0025 136/73 97.8 F (36.6 C) Oral 64 16 99 %  06/01/18 2143 139/78 97.6 F (36.4 C) Oral 60 17 98 %  06/01/18 1721 134/77 97.7 F (36.5 C)  Oral 67 18 99 %  06/01/18 1351 138/76 98.2 F (36.8 C) Oral 67 14 99 %       Recent laboratory studies: Recent Labs    05/28/18 0959 06/02/18 0529  WBC 6.2 12.0*  HGB 15.0 11.2*  HCT 46.6* 34.3*  PLT 254 194   Recent Labs    05/28/18 0959 06/02/18 0529  NA 138 139  K 5.0 4.6  CL 105 107  CO2 26 27  BUN 13 12  CREATININE 0.85 0.79  GLUCOSE 107* 138*  CALCIUM 9.3 8.9   No results found for: INR, PROTIME   Recent Radiographic Studies :  No results found.  DISCHARGE INSTRUCTIONS: Discharge Instructions    CPM   Complete by:  As directed    Continuous passive motion machine (CPM):      Use the CPM from 0 to 90 for 4-6 hours per day.      You may increase by 10 per day.  You may break it up into 2 or 3 sessions per day.      Use CPM for 2 weeks or until you are told to stop.   Call MD / Call 911   Complete by:  As directed    If you experience chest pain or shortness of breath, CALL 911 and be transported to the hospital emergency  room.  If you develope a fever above 101 F, pus (white drainage) or increased drainage or redness at the wound, or calf pain, call your surgeon's office.   Constipation Prevention   Complete by:  As directed    Drink plenty of fluids.  Prune juice may be helpful.  You may use a stool softener, such as Colace (over the counter) 100 mg twice a day.  Use MiraLax (over the counter) for constipation as needed.   Diet - low sodium heart healthy   Complete by:  As directed    Discharge instructions   Complete by:  As directed    INSTRUCTIONS AFTER JOINT REPLACEMENT   Remove items at home which could result in a fall. This includes throw rugs or furniture in walking pathways ICE to the affected joint every three hours while awake for 30 minutes at a time, for at least the first 3-5 days, and then as needed for pain and swelling.  Continue to use ice for pain and swelling. You may notice swelling that will progress down to the foot and ankle.  This is normal after surgery.  Elevate your leg when you are not up walking on it.   Continue to use the breathing machine you got in the hospital (incentive spirometer) which will help keep your temperature down.  It is common for your temperature to cycle up and down following surgery, especially at night when you are not up moving around and exerting yourself.  The breathing machine keeps your lungs expanded and your temperature down.   DIET:  As you were doing prior to hospitalization, we recommend a well-balanced diet.  DRESSING / WOUND CARE / SHOWERING  Keep the surgical dressing until follow up.  The dressing is water proof, so you can shower without any extra covering.  IF THE DRESSING FALLS OFF or the wound gets wet inside, change the dressing with sterile gauze.  Please use good hand washing techniques before changing the dressing.  Do not use any lotions or creams on the incision until instructed by your surgeon.    ACTIVITY  Increase activity slowly  as tolerated,  but follow the weight bearing instructions below.   No driving for 6 weeks or until further direction given by your physician.  You cannot drive while taking narcotics.  No lifting or carrying greater than 10 lbs. until further directed by your surgeon. Avoid periods of inactivity such as sitting longer than an hour when not asleep. This helps prevent blood clots.  You may return to work once you are authorized by your doctor.     WEIGHT BEARING   Weight bearing as tolerated with assist device (walker, cane, etc) as directed, use it as long as suggested by your surgeon or therapist, typically at least 4-6 weeks.   EXERCISES  Results after joint replacement surgery are often greatly improved when you follow the exercise, range of motion and muscle strengthening exercises prescribed by your doctor. Safety measures are also important to protect the joint from further injury. Any time any of these exercises cause you to have increased pain or swelling, decrease what you are doing until you are comfortable again and then slowly increase them. If you have problems or questions, call your caregiver or physical therapist for advice.   Rehabilitation is important following a joint replacement. After just a few days of immobilization, the muscles of the leg can become weakened and shrink (atrophy).  These exercises are designed to build up the tone and strength of the thigh and leg muscles and to improve motion. Often times heat used for twenty to thirty minutes before working out will loosen up your tissues and help with improving the range of motion but do not use heat for the first two weeks following surgery (sometimes heat can increase post-operative swelling).   These exercises can be done on a training (exercise) mat, on the floor, on a table or on a bed. Use whatever works the best and is most comfortable for you.    Use music or television while you are exercising so that the  exercises are a pleasant break in your day. This will make your life better with the exercises acting as a break in your routine that you can look forward to.   Perform all exercises about fifteen times, three times per day or as directed.  You should exercise both the operative leg and the other leg as well.   Exercises include:   Quad Sets - Tighten up the muscle on the front of the thigh (Quad) and hold for 5-10 seconds.   Straight Leg Raises - With your knee straight (if you were given a brace, keep it on), lift the leg to 60 degrees, hold for 3 seconds, and slowly lower the leg.  Perform this exercise against resistance later as your leg gets stronger.  Leg Slides: Lying on your back, slowly slide your foot toward your buttocks, bending your knee up off the floor (only go as far as is comfortable). Then slowly slide your foot back down until your leg is flat on the floor again.  Angel Wings: Lying on your back spread your legs to the side as far apart as you can without causing discomfort.  Hamstring Strength:  Lying on your back, push your heel against the floor with your leg straight by tightening up the muscles of your buttocks.  Repeat, but this time bend your knee to a comfortable angle, and push your heel against the floor.  You may put a pillow under the heel to make it more comfortable if necessary.   A rehabilitation program following joint  replacement surgery can speed recovery and prevent re-injury in the future due to weakened muscles. Contact your doctor or a physical therapist for more information on knee rehabilitation.    CONSTIPATION  Constipation is defined medically as fewer than three stools per week and severe constipation as less than one stool per week.  Even if you have a regular bowel pattern at home, your normal regimen is likely to be disrupted due to multiple reasons following surgery.  Combination of anesthesia, postoperative narcotics, change in appetite and fluid  intake all can affect your bowels.   YOU MUST use at least one of the following options; they are listed in order of increasing strength to get the job done.  They are all available over the counter, and you may need to use some, POSSIBLY even all of these options:    Drink plenty of fluids (prune juice may be helpful) and high fiber foods Colace 100 mg by mouth twice a day  Senokot for constipation as directed and as needed Dulcolax (bisacodyl), take with full glass of water  Miralax (polyethylene glycol) once or twice a day as needed.  If you have tried all these things and are unable to have a bowel movement in the first 3-4 days after surgery call either your surgeon or your primary doctor.    If you experience loose stools or diarrhea, hold the medications until you stool forms back up.  If your symptoms do not get better within 1 week or if they get worse, check with your doctor.  If you experience "the worst abdominal pain ever" or develop nausea or vomiting, please contact the office immediately for further recommendations for treatment.   ITCHING:  If you experience itching with your medications, try taking only a single pain pill, or even half a pain pill at a time.  You can also use Benadryl over the counter for itching or also to help with sleep.   TED HOSE STOCKINGS:  Use stockings on both legs until for at least 2 weeks or as directed by physician office. They may be removed at night for sleeping.  MEDICATIONS:  See your medication summary on the "After Visit Summary" that nursing will review with you.  You may have some home medications which will be placed on hold until you complete the course of blood thinner medication.  It is important for you to complete the blood thinner medication as prescribed.  PRECAUTIONS:  If you experience chest pain or shortness of breath - call 911 immediately for transfer to the hospital emergency department.   If you develop a fever greater that  101 F, purulent drainage from wound, increased redness or drainage from wound, foul odor from the wound/dressing, or calf pain - CONTACT YOUR SURGEON.                                                   FOLLOW-UP APPOINTMENTS:  If you do not already have a post-op appointment, please call the office for an appointment to be seen by your surgeon.  Guidelines for how soon to be seen are listed in your "After Visit Summary", but are typically between 1-4 weeks after surgery.  OTHER INSTRUCTIONS:   Knee Replacement:  Do not place pillow under knee, focus on keeping the knee straight while resting. CPM instructions: 0-90 degrees, 2  hours in the morning, 2 hours in the afternoon, and 2 hours in the evening. Place foam block, curve side up under heel at all times except when in CPM or when walking.  DO NOT modify, tear, cut, or change the foam block in any way.  MAKE SURE YOU:  Understand these instructions.  Get help right away if you are not doing well or get worse.    Thank you for letting us be a part of your medical care team.  It is a privilege we respect greatly.  We hope these instructions will help you stay on track for a fast and full recovery!   Increase activity slowly as tolerated   Complete by:  As directed       DISCHARGE MEDICATIONS:   Allergies as of 06/02/2018      Reactions   Mercury    Migraines/ headaches due to dental fillings    Methocarbamol    Dizziness, syncope    Benzoin Rash   Makes skin peel      Medication List    STOP taking these medications   methocarbamol 500 MG tablet Commonly known as:  ROBAXIN     TAKE these medications   acetaminophen 500 MG tablet Commonly known as:  TYLENOL Take 1,000 mg by mouth daily as needed for moderate pain or headache.   aspirin 325 MG EC tablet Take 1 tablet (325 mg total) by mouth 2 (two) times daily.   AZOR 10-40 MG tablet Generic drug:  amLODipine-olmesartan Take 1 tablet by mouth daily.   BIOTIN PO Take 2,000  mcg by mouth daily.   calcium-vitamin D 500-200 MG-UNIT tablet Commonly known as:  OSCAL WITH D Take 1 tablet by mouth daily.   cholecalciferol 1000 units tablet Commonly known as:  VITAMIN D Take 1,000 Units by mouth daily.   oxyCODONE 5 MG immediate release tablet Commonly known as:  Oxy IR/ROXICODONE Take 1-2 tablets (5-10 mg total) by mouth every 6 (six) hours as needed for moderate pain (pain score 4-6).   vitamin C 500 MG tablet Commonly known as:  ASCORBIC ACID Take 500 mg by mouth daily.            Durable Medical Equipment  (From admission, onward)         Start     Ordered   06/01/18 1036  DME Walker rolling  Once    Question:  Patient needs a walker to treat with the following condition  Answer:  S/P total knee replacement   06/01/18 1035   06/01/18 1036  DME 3 n 1  Once     06/01/18 1035   06/01/18 1036  DME Bedside commode  Once    Question:  Patient needs a bedside commode to treat with the following condition  Answer:  S/P total knee replacement   06/01/18 1035          FOLLOW UP VISIT:    DISPOSITION: HOME VS. SNF  CONDITION:  Good   Guy Sandifer 06/02/2018, 1:00 PM

## 2018-06-03 ENCOUNTER — Encounter (HOSPITAL_COMMUNITY): Payer: Self-pay | Admitting: Orthopedic Surgery

## 2018-06-04 ENCOUNTER — Ambulatory Visit: Payer: Medicare Other | Admitting: Physical Therapy

## 2018-06-08 ENCOUNTER — Encounter: Payer: Self-pay | Admitting: Physical Therapy

## 2018-06-08 ENCOUNTER — Ambulatory Visit: Payer: Medicare Other | Attending: Orthopedic Surgery | Admitting: Physical Therapy

## 2018-06-08 DIAGNOSIS — M25562 Pain in left knee: Secondary | ICD-10-CM | POA: Diagnosis not present

## 2018-06-08 DIAGNOSIS — R600 Localized edema: Secondary | ICD-10-CM | POA: Diagnosis present

## 2018-06-08 DIAGNOSIS — M25662 Stiffness of left knee, not elsewhere classified: Secondary | ICD-10-CM | POA: Diagnosis present

## 2018-06-08 DIAGNOSIS — R262 Difficulty in walking, not elsewhere classified: Secondary | ICD-10-CM | POA: Diagnosis present

## 2018-06-08 NOTE — Therapy (Signed)
[facility] [place] [facility] [address] [place], [place], [address] Phone: [phone]   Fax:  [phone]  Physical Therapy Evaluation  Patient Details  Name: Lisa Wheeler MRN: [id number] Date of Birth: August 02, 1951 Referring Provider (PT): Lucey   Encounter Date: 06/08/2018  PT End of Session - 06/08/18 1136    Visit Number  1    Date for PT Re-Evaluation  08/08/18    PT Start Time  1055    PT Stop Time  1145    PT Time Calculation (min)  50 min    Activity Tolerance  Patient tolerated treatment well    Behavior During Therapy  [facility] for tasks assessed/performed       Past Medical History:  Diagnosis Date  . Arthritis   . Elevated lipids   . Hypertension   . Osteoarthritis of right knee    Severe  . Osteopenia    right hip    Past Surgical History:  Procedure Laterality Date  . CLOSED REDUCTION HAND FRACTURE    . OOPHORECTOMY     right  . TOTAL KNEE ARTHROPLASTY Right 02/23/2018   Procedure: TOTAL KNEE ARTHROPLASTY;  Surgeon: Dannielle Huh, MD;  [facility];  Service: Orthopedics;  Laterality: Right;  . TOTAL KNEE ARTHROPLASTY Left 06/01/2018   Procedure: LEFT TOTAL KNEE ARTHROPLASTY;  Surgeon: Dannielle Huh, MD;  [facility]: [facility];  Service: Orthopedics;  Laterality: Left;  Adductor Block  . TUBAL LIGATION  1985  . WRIST SURGERY  2/12    There were no vitals filed for this visit.   Subjective Assessment - 06/08/18 1102    Subjective  Patient underwent a left TKR on 06/01/18.  Had a one night hospital stay, then to home, no home PT, has CPM.  She reports that sh has some anxiety due to the pain medication.  She reports that she cancelled her PT here last week due to feeling so anxious.    Pertinent History  right TKR in June     Limitations  Standing;Walking;House hold activities    Patient Stated Goals  have less pain, walk easier    Currently in Pain?  Yes    Pain Score  3     Pain Location  Knee    Pain  Orientation  Left    Pain Descriptors / Indicators  Aching;Tightness    Pain Type  Acute pain;Surgical pain    Pain Onset  In the past 7 days    Pain Frequency  Constant    Aggravating Factors   worst in AM, worse bending in CPM  pain up to 6/10    Pain Relieving Factors  ice pain meds and rest pain can be 2/10    Effect of Pain on Daily Activities  limits sleep         [facility] PT Assessment - 06/08/18 0001      Assessment   Medical Diagnosis  s/p left TKR    Referring Provider (PT)  Lucey    Onset Date/Surgical Date  06/01/18    Prior Therapy  in June and July for the right TKR      Precautions   Precautions  None      Balance Screen   Has the patient fallen in the past 6 months  No    Has the patient had a decrease in activity level because of a fear of falling?   No    Is the patient reluctant to  leave their home because of a fear of falling?   No      Home Environment   Additional Comments  has stairs, housework      Prior Function   Level of Independence  Independent    Vocation  Retired    Leisure  loves to walk, was walking 5 miles a day      Observation/Other Assessments-Edema    Edema  Circumferential      Circumferential Edema   Circumferential - Right  48.5 cm mid patella    Circumferential - Left   52.5 cm      ROM / Strength   AROM / PROM / Strength  AROM;PROM;Strength      AROM   AROM Assessment Site  Knee    Right/Left Knee  Right;Left    Right Knee Extension  14    Right Knee Flexion  111    Left Knee Extension  28    Left Knee Flexion  72      PROM   PROM Assessment Site  Knee    Right/Left Knee  Right;Left    Right Knee Extension  5    Right Knee Flexion  115    Left Knee Extension  12    Left Knee Flexion  78      Strength   Overall Strength Comments  4/5 in the ROM      Palpation   Palpation comment  has stocking on, has bandaqe on with some bleeding through, has a lot of ecchymosis posterior knee      Ambulation/Gait   Gait  Comments  uses a FWW, slow, narrow base of support      Standardized Balance Assessment   Standardized Balance Assessment  Timed Up and Go Test      Timed Up and Go Test   Normal TUG (seconds)  19                Objective measurements completed on examination: See above findings.      [facility] Adult PT Treatment/Exercise - 06/08/18 0001      Exercises   Exercises  Knee/Hip      Knee/Hip Exercises: Aerobic   Nustep  level 4 x 5 minutes      Modalities   Modalities  Vasopneumatic      Vasopneumatic   Number Minutes Vasopneumatic   10 minutes    Vasopnuematic Location   Knee    Vasopneumatic Pressure  Medium    Vasopneumatic Temperature   34             PT Education - 06/08/18 1135    Education Details  low load long duration flexion and extension stretches    Person(s) Educated  Patient    Methods  Explanation;Demonstration;Handout    Comprehension  Verbalized understanding       PT Short Term Goals - 06/08/18 1139      PT SHORT TERM GOAL #1   Title  indepdndent with initial HEP    Time  2    Period  Weeks    Status  New        PT Long Term Goals - 06/08/18 1139      PT LONG TERM GOAL #1   Title  understand RICE    Time  8    Period  Weeks    Status  New      PT LONG TERM GOAL #2   Title  walk without device all  community distances    Time  8    Period  Weeks    Status  New      PT LONG TERM GOAL #3   Title  increase AROM to 5-115 degrees flexion    Time  8    Period  Weeks    Status  New      PT LONG TERM GOAL #4   Title  decrease pain 50%    Time  8    Period  Weeks    Status  New      PT LONG TERM GOAL #5   Title  sleep > 5 hours    Time  8    Period  Weeks    Status  New             Plan - 06/08/18 1136    Clinical Impression Statement  Patient underwent a left TKR on 06/01/18.  She is very swollen.  Her AROM was 38-72 degrees flexion, bandages are in place.  There is a lot of ecchymossis behind the knee.  She  walks with a FWW, TUG was 19 seconds.  Reports pain and difficulty sleeping at night    History and Personal Factors relevant to plan of care:  right TKR in June 2019    Clinical Presentation  Evolving    Clinical Decision Making  Low    Rehab Potential  Good    PT Frequency  3x / week    PT Duration  8 weeks    PT Treatment/Interventions  ADLs/Self Care Home Management;Cryotherapy;Electrical Stimulation;Gait training;Stair training;Functional mobility training;Therapeutic activities;Balance training;Therapeutic exercise;Manual techniques;Vasopneumatic Device    PT Next Visit Plan  start working on ROM    Consulted and Agree with Plan of Care  Patient       Patient will benefit from skilled therapeutic intervention in order to improve the following deficits and impairments:  Abnormal gait, Decreased range of motion, Difficulty walking, Pain, Decreased balance, Decreased mobility, Decreased strength, Increased edema  Visit Diagnosis: Acute pain of left knee - Plan: PT plan of care cert/re-cert  Localized edema - Plan: PT plan of care cert/re-cert  Difficulty in walking, not elsewhere classified - Plan: PT plan of care cert/re-cert     Problem List Patient Active Problem List   Diagnosis Date Noted  . S/P total knee replacement 02/23/2018  . Essential hypertension 08/08/2015  . Osteoporosis 06/15/2014    Jearld Lesch., PT 06/08/2018, 11:47 AM  [facility] [place] [facility] [address] [place], [place], [address] Phone: [phone]   Fax:  [phone]  Name: Lisa Wheeler MRN: [id number] Date of Birth: 1950-10-31

## 2018-06-11 ENCOUNTER — Encounter: Payer: Self-pay | Admitting: Physical Therapy

## 2018-06-11 ENCOUNTER — Ambulatory Visit: Payer: Medicare Other | Admitting: Physical Therapy

## 2018-06-11 DIAGNOSIS — M25562 Pain in left knee: Secondary | ICD-10-CM

## 2018-06-11 DIAGNOSIS — R262 Difficulty in walking, not elsewhere classified: Secondary | ICD-10-CM

## 2018-06-11 DIAGNOSIS — R600 Localized edema: Secondary | ICD-10-CM

## 2018-06-11 NOTE — Therapy (Signed)
[facility] [place] [facility] [address] [place], [place], [address] Phone: [phone]   Fax:  [phone]  Physical Therapy Treatment  Patient Details  Name: Lisa Wheeler MRN: [id number] Date of Birth: [birth year] Referring Provider (PT): Lucey   Encounter Date: 06/11/2018  PT End of Session - 06/11/18 1528    Visit Number  2    Date for PT Re-Evaluation  08/08/18    PT Start Time  1358    PT Stop Time  1500    PT Time Calculation (min)  62 min    Activity Tolerance  Patient tolerated treatment well    Behavior During Therapy  Anxious       Past Medical History:  Diagnosis Date  . Arthritis   . Elevated lipids   . Hypertension   . Osteoarthritis of right knee    Severe  . Osteopenia    right hip    Past Surgical History:  Procedure Laterality Date  . CLOSED REDUCTION HAND FRACTURE    . OOPHORECTOMY     right  . TOTAL KNEE ARTHROPLASTY Right 02/23/2018   Procedure: TOTAL KNEE ARTHROPLASTY;  Surgeon: Dannielle Huh, MD;  [facility];  Service: Orthopedics;  Laterality: Right;  . TOTAL KNEE ARTHROPLASTY Left 06/01/2018   Procedure: LEFT TOTAL KNEE ARTHROPLASTY;  Surgeon: Dannielle Huh, MD;  [facility];  Service: Orthopedics;  Laterality: Left;  Adductor Block  . TUBAL LIGATION  1985  . WRIST SURGERY  2/12    There were no vitals filed for this visit.  Subjective Assessment - 06/11/18 1400    Subjective  Patient saw the MD yesterday, pleased with where she is, she reports that she is going to try to stop the prescription pain meds.      Currently in Pain?  Yes    Pain Score  3     Pain Location  Knee    Pain Orientation  Left    Pain Descriptors / Indicators  Aching;Tightness                       [facility] Adult PT Treatment/Exercise - 06/11/18 0001      Exercises   Exercises  Knee/Hip      Knee/Hip Exercises: Aerobic   Recumbent Bike  partial revolutions 5 minutes    Nustep  level 4 x  5 minutes      Knee/Hip Exercises: Machines for Strengthening   Cybex Knee Flexion  25# 2x10    Cybex Leg Press  no weight  3x10 working on ROM      Knee/Hip Exercises: Standing   Other Standing Knee Exercises  6" and 8" toe clears with cues to decrease compensation, foot on step lungers for ROM      Modalities   Modalities  Vasopneumatic      Vasopneumatic   Number Minutes Vasopneumatic   10 minutes    Vasopnuematic Location   Knee    Vasopneumatic Pressure  Medium    Vasopneumatic Temperature   34      Manual Therapy   Manual Therapy  Joint mobilization;Soft tissue mobilization;Passive ROM    Joint Mobilization  gentle mobilizations for pain relief during PROM    Soft tissue mobilization  to the scar and quad    Passive ROM  PROM, low load long duration stretcjhes with some gentle contract relax  PT Short Term Goals - 06/08/18 1139      PT SHORT TERM GOAL #1   Title  indepdndent with initial HEP    Time  2    Period  Weeks    Status  New        PT Long Term Goals - 06/08/18 1139      PT LONG TERM GOAL #1   Title  understand RICE    Time  8    Period  Weeks    Status  New      PT LONG TERM GOAL #2   Title  walk without device all community distances    Time  8    Period  Weeks    Status  New      PT LONG TERM GOAL #3   Title  increase AROM to 5-115 degrees flexion    Time  8    Period  Weeks    Status  New      PT LONG TERM GOAL #4   Title  decrease pain 50%    Time  8    Period  Weeks    Status  New      PT LONG TERM GOAL #5   Title  sleep > 5 hours    Time  8    Period  Weeks    Status  New            Plan - 06/11/18 1529    Clinical Impression Statement  Patient has a great deal of difficulty relaxing, seems anxious and does not allow PROM, hikes hip and fights,  Needs a lot of cues    PT Next Visit Plan  start working on ROM    Consulted and Agree with Plan of Care  Patient       Patient will benefit from  skilled therapeutic intervention in order to improve the following deficits and impairments:  Abnormal gait, Decreased range of motion, Difficulty walking, Pain, Decreased balance, Decreased mobility, Decreased strength, Increased edema  Visit Diagnosis: Acute pain of left knee  Localized edema  Difficulty in walking, not elsewhere classified     Problem List Patient Active Problem List   Diagnosis Date Noted  . S/P total knee replacement 02/23/2018  . Essential hypertension 08/08/2015  . Osteoporosis 06/15/2014    Jearld Lesch., PT 06/11/2018, 3:30 PM  [facility] [place] [facility] [address] [place], [place], [address] Phone: [phone]   Fax:  [phone]  Name: Lisa Wheeler MRN: [id number] Date of Birth: [birth year]

## 2018-06-15 ENCOUNTER — Encounter: Payer: Self-pay | Admitting: Physical Therapy

## 2018-06-15 ENCOUNTER — Ambulatory Visit: Payer: Medicare Other | Admitting: Physical Therapy

## 2018-06-15 DIAGNOSIS — R262 Difficulty in walking, not elsewhere classified: Secondary | ICD-10-CM

## 2018-06-15 DIAGNOSIS — M25562 Pain in left knee: Secondary | ICD-10-CM | POA: Diagnosis not present

## 2018-06-15 DIAGNOSIS — R600 Localized edema: Secondary | ICD-10-CM

## 2018-06-15 NOTE — Therapy (Signed)
[facility] [place] [facility] [address] [place], [place], [address] Phone: [phone]   Fax:  [phone]  Physical Therapy Treatment  Patient Details  Name: Lisa Wheeler MRN: [id number] Date of Birth: 1951-06-25 Referring Provider (PT): Lucey   Encounter Date: 06/15/2018  PT End of Session - 06/15/18 1430    Visit Number  3    Date for PT Re-Evaluation  08/08/18    PT Start Time  1345    PT Stop Time  1440    PT Time Calculation (min)  55 min       Past Medical History:  Diagnosis Date  . Arthritis   . Elevated lipids   . Hypertension   . Osteoarthritis of right knee    Severe  . Osteopenia    right hip    Past Surgical History:  Procedure Laterality Date  . CLOSED REDUCTION HAND FRACTURE    . OOPHORECTOMY     right  . TOTAL KNEE ARTHROPLASTY Right 02/23/2018   Procedure: TOTAL KNEE ARTHROPLASTY;  Surgeon: Dannielle Huh, MD;  [facility];  Service: Orthopedics;  Laterality: Right;  . TOTAL KNEE ARTHROPLASTY Left 06/01/2018   Procedure: LEFT TOTAL KNEE ARTHROPLASTY;  Surgeon: Dannielle Huh, MD;  [facility];  Service: Orthopedics;  Laterality: Left;  Adductor Block  . TUBAL LIGATION  1985  . WRIST SURGERY  2/12    There were no vitals filed for this visit.  Subjective Assessment - 06/15/18 1345    Subjective  "Doing ok"    Currently in Pain?  Yes    Pain Score  4     Pain Location  Knee    Pain Orientation  Left    Pain Descriptors / Indicators  Tightness         [facility] PT Assessment - 06/15/18 0001      AROM   Left Knee Extension  17    Left Knee Flexion  79                   [facility] Adult PT Treatment/Exercise - 06/15/18 0001      Exercises   Exercises  Knee/Hip      Knee/Hip Exercises: Aerobic   Recumbent Bike  partial revolutions 5 minutes    Nustep  level 3 x 5 minutes NO UE      Knee/Hip Exercises: Machines for Strengthening   Cybex Knee Flexion  25# 2x10    Cybex Leg  Press  no weight  3x10 working on ROM      Knee/Hip Exercises: Seated   Long Arc Quad  Weights;10 reps;2 sets;Left;Strengthening    Long Arc Quad Weight  2 lbs.    Hamstring Curl  Left;2 sets;10 reps    Hamstring Limitations  green Tband      Modalities   Modalities  Vasopneumatic      Vasopneumatic   Number Minutes Vasopneumatic   10 minutes    Vasopnuematic Location   Knee    Vasopneumatic Pressure  Medium    Vasopneumatic Temperature   34      Manual Therapy   Manual Therapy  Joint mobilization;Soft tissue mobilization;Passive ROM    Joint Mobilization  gentle mobilizations for pain relief during PROM    Soft tissue mobilization  to the scar and quad    Passive ROM  PROM, low load long duration stretcjhes with some gentle contract relax  PT Short Term Goals - 06/08/18 1139      PT SHORT TERM GOAL #1   Title  indepdndent with initial HEP    Time  2    Period  Weeks    Status  New        PT Long Term Goals - 06/08/18 1139      PT LONG TERM GOAL #1   Title  understand RICE    Time  8    Period  Weeks    Status  New      PT LONG TERM GOAL #2   Title  walk without device all community distances    Time  8    Period  Weeks    Status  New      PT LONG TERM GOAL #3   Title  increase AROM to 5-115 degrees flexion    Time  8    Period  Weeks    Status  New      PT LONG TERM GOAL #4   Title  decrease pain 50%    Time  8    Period  Weeks    Status  New      PT LONG TERM GOAL #5   Title  sleep > 5 hours    Time  8    Period  Weeks    Status  New            Plan - 06/15/18 1431    Clinical Impression Statement  Pt did well overall. Pt is very guarded and compensated during MT not allowing PROM. Slight increase in both L knee flexion and extension. Progressed to leg press with resistance and seated LAQ.    Rehab Potential  Good    PT Frequency  3x / week    PT Duration  8 weeks    PT Treatment/Interventions  ADLs/Self Care Home  Management;Cryotherapy;Electrical Stimulation;Gait training;Stair training;Functional mobility training;Therapeutic activities;Balance training;Therapeutic exercise;Manual techniques;Vasopneumatic Device    PT Next Visit Plan  continue working on ROM       Patient will benefit from skilled therapeutic intervention in order to improve the following deficits and impairments:  Abnormal gait, Decreased range of motion, Difficulty walking, Pain, Decreased balance, Decreased mobility, Decreased strength, Increased edema  Visit Diagnosis: Acute pain of left knee  Localized edema  Difficulty in walking, not elsewhere classified     Problem List Patient Active Problem List   Diagnosis Date Noted  . S/P total knee replacement 02/23/2018  . Essential hypertension 08/08/2015  . Osteoporosis 06/15/2014    Grayce Sessions, PTA 06/15/2018, 2:32 PM  [facility] [place] [facility] [address] [place], [place], [address] Phone: [phone]   Fax:  [phone]  Name: Lisa Wheeler MRN: [id number] Date of Birth: Jan 04, 1951

## 2018-06-18 ENCOUNTER — Ambulatory Visit: Payer: Medicare Other | Admitting: Physical Therapy

## 2018-06-18 ENCOUNTER — Encounter: Payer: Self-pay | Admitting: Physical Therapy

## 2018-06-18 DIAGNOSIS — R262 Difficulty in walking, not elsewhere classified: Secondary | ICD-10-CM

## 2018-06-18 DIAGNOSIS — M25562 Pain in left knee: Secondary | ICD-10-CM | POA: Diagnosis not present

## 2018-06-18 DIAGNOSIS — R600 Localized edema: Secondary | ICD-10-CM

## 2018-06-18 NOTE — Therapy (Signed)
[facility] [place] [facility] [address] [place], [place], [address] Phone: [phone]   Fax:  [phone]  Physical Therapy Treatment  Patient Details  Name: Lisa Wheeler MRN: [id number] Date of Birth: [birth year] Referring Provider (PT): Lucey   Encounter Date: 06/18/2018  PT End of Session - 06/18/18 1014    Visit Number  4    Date for PT Re-Evaluation  08/08/18    PT Start Time  0930    PT Stop Time  1028    PT Time Calculation (min)  58 min    Equipment Utilized During Treatment  Gait belt    Activity Tolerance  Patient tolerated treatment well    Behavior During Therapy  Anxious       Past Medical History:  Diagnosis Date  . Arthritis   . Elevated lipids   . Hypertension   . Osteoarthritis of right knee    Severe  . Osteopenia    right hip    Past Surgical History:  Procedure Laterality Date  . CLOSED REDUCTION HAND FRACTURE    . OOPHORECTOMY     right  . TOTAL KNEE ARTHROPLASTY Right 02/23/2018   Procedure: TOTAL KNEE ARTHROPLASTY;  Surgeon: Dannielle Huh, MD;  [facility];  Service: Orthopedics;  Laterality: Right;  . TOTAL KNEE ARTHROPLASTY Left 06/01/2018   Procedure: LEFT TOTAL KNEE ARTHROPLASTY;  Surgeon: Dannielle Huh, MD;  [facility];  Service: Orthopedics;  Laterality: Left;  Adductor Block  . TUBAL LIGATION  1985  . WRIST SURGERY  2/12    There were no vitals filed for this visit.  Subjective Assessment - 06/18/18 0932    Subjective  Pt reports some R knee pain Monday night that she thinks come from PROM.     Currently in Pain?  No/denies    Pain Score  0-No pain                       [facility] Adult PT Treatment/Exercise - 06/18/18 0001      Exercises   Exercises  Knee/Hip      Knee/Hip Exercises: Aerobic   Recumbent Bike  partial revolutions 5 minutes    Nustep  level 4 x 5 minutes       Knee/Hip Exercises: Machines for Strengthening   Cybex Leg Press  20lb 2x10,  LLE TKE 20lb 2x10       Knee/Hip Exercises: Standing   Heel Raises  Both;2 sets;10 reps;2 seconds    Forward Step Up  Left;2 sets;10 reps;Hand Hold: 2;Step Height: 4";Step Height: 6"      Knee/Hip Exercises: Seated   Long Arc Quad  Weights;10 reps;2 sets;Left;Strengthening    Long Arc Quad Weight  2 lbs.    Other Seated Knee/Hip Exercises  Quad sets with manual ressitance LLE 2x10     Hamstring Curl  Left;2 sets;10 reps    Hamstring Limitations  green Tband      Modalities   Modalities  Vasopneumatic      Vasopneumatic   Number Minutes Vasopneumatic   15 minutes    Vasopnuematic Location   Knee    Vasopneumatic Pressure  Medium    Vasopneumatic Temperature   34      Manual Therapy   Manual Therapy  Passive ROM;Soft tissue mobilization    Soft tissue mobilization  ToL quad    Passive ROM  PROM taken to ebd range and held  PT Short Term Goals - 06/18/18 1018      PT SHORT TERM GOAL #1   Title  indepdndent with initial HEP    Status  Achieved        PT Long Term Goals - 06/18/18 1018      PT LONG TERM GOAL #1   Title  understand RICE    Status  Achieved      PT LONG TERM GOAL #2   Title  walk without device all community distances    Status  On-going            Plan - 06/18/18 1015    Clinical Impression Statement  Pt continues to compensate during PROM, leaning and hiking L hip with flexion. Pt ambulated with flexed L knee. Cues not to circumduct with step ups. More interventions focused on pt activated extension and less manually due to pt reports of pain after last session.     Rehab Potential  Good    PT Treatment/Interventions  ADLs/Self Care Home Management;Cryotherapy;Electrical Stimulation;Gait training;Stair training;Functional mobility training;Therapeutic activities;Balance training;Therapeutic exercise;Manual techniques;Vasopneumatic Device    PT Next Visit Plan  continue working on ROM       Patient will benefit from  skilled therapeutic intervention in order to improve the following deficits and impairments:  Abnormal gait, Decreased range of motion, Difficulty walking, Pain, Decreased balance, Decreased mobility, Decreased strength, Increased edema  Visit Diagnosis: Localized edema  Acute pain of left knee  Difficulty in walking, not elsewhere classified     Problem List Patient Active Problem List   Diagnosis Date Noted  . S/P total knee replacement 02/23/2018  . Essential hypertension 08/08/2015  . Osteoporosis 06/15/2014    Grayce Sessions, PTA 06/18/2018, 10:19 AM  [facility] [place] [facility] [address] [place], [place], [address] Phone: [phone]   Fax:  [phone]  Name: Lisa Wheeler MRN: [id number] Date of Birth: [birth year]

## 2018-06-22 ENCOUNTER — Encounter: Payer: Self-pay | Admitting: Physical Therapy

## 2018-06-22 ENCOUNTER — Ambulatory Visit: Payer: Medicare Other | Admitting: Physical Therapy

## 2018-06-22 DIAGNOSIS — M25562 Pain in left knee: Secondary | ICD-10-CM

## 2018-06-22 DIAGNOSIS — M25662 Stiffness of left knee, not elsewhere classified: Secondary | ICD-10-CM

## 2018-06-22 DIAGNOSIS — R600 Localized edema: Secondary | ICD-10-CM

## 2018-06-22 DIAGNOSIS — R262 Difficulty in walking, not elsewhere classified: Secondary | ICD-10-CM

## 2018-06-22 NOTE — Therapy (Signed)
[facility] [address] [place], [place], [address] Phone: [phone]   Fax:  [phone]  Physical Therapy Treatment  Patient Details  Name: Lisa Wheeler MRN: [id number] Date of Birth: 08/25/1951 Referring Provider (PT): Lucey   Encounter Date: 06/22/2018  PT End of Session - 06/22/18 1616    Visit Number  5    Date for PT Re-Evaluation  08/08/18    PT Start Time  1525    PT Stop Time  1625    PT Time Calculation (min)  60 min    Activity Tolerance  Patient limited by pain    Behavior During Therapy  Anxious       Past Medical History:  Diagnosis Date  . Arthritis   . Elevated lipids   . Hypertension   . Osteoarthritis of right knee    Severe  . Osteopenia    right hip    Past Surgical History:  Procedure Laterality Date  . CLOSED REDUCTION HAND FRACTURE    . OOPHORECTOMY     right  . TOTAL KNEE ARTHROPLASTY Right 02/23/2018   Procedure: TOTAL KNEE ARTHROPLASTY;  Surgeon: Dannielle Huh, MD;  [facility];  Service: Orthopedics;  Laterality: Right;  . TOTAL KNEE ARTHROPLASTY Left 06/01/2018   Procedure: LEFT TOTAL KNEE ARTHROPLASTY;  Surgeon: Dannielle Huh, MD;  [facility];  Service: Orthopedics;  Laterality: Left;  Adductor Block  . TUBAL LIGATION  1985  . WRIST SURGERY  2/12    There were no vitals filed for this visit.  Subjective Assessment - 06/22/18 1528    Subjective  Patient reports that they came and got the CPM on Thursday, reports Friday she did 5 minutes on the bike 3x, she reports Saturday when she woke up she had significant medial knee pain.  pain an 8/10.      Currently in Pain?  Yes    Pain Score  7     Pain Location  Knee    Pain Orientation  Left;Medial    Pain Descriptors / Indicators  Sharp    Aggravating Factors   bending the knee                       [facility] Adult PT Treatment/Exercise - 06/22/18 0001      High Level Balance   High Level  Balance Activities  Backward walking;Side stepping      Exercises   Exercises  Knee/Hip      Knee/Hip Exercises: Aerobic   Nustep  level 4 x 6 minutes       Knee/Hip Exercises: Machines for Strengthening   Cybex Knee Extension  no weight 2x10    Cybex Knee Flexion  20# 3x10      Knee/Hip Exercises: Standing   Other Standing Knee Exercises  TKE ball behind the knee      Knee/Hip Exercises: Supine   Short Arc Quad Sets  Left;3 sets;10 reps    Heel Slides  Left;3 sets;10 reps    Heel Slides Limitations  PT over pressure      Modalities   Modalities  Vasopneumatic      Vasopneumatic   Number Minutes Vasopneumatic   15 minutes    Vasopnuematic Location   Knee    Vasopneumatic Pressure  Medium    Vasopneumatic Temperature   37      Manual Therapy   Manual Therapy  Passive ROM;Soft tissue mobilization  Joint Mobilization  gentle mobilizations for pain relief during PROM    Soft tissue mobilization  to quad and scar    Passive ROM  PROM taken to end range and held, worked on flexion and extension               PT Short Term Goals - 06/18/18 1018      PT SHORT TERM GOAL #1   Title  indepdndent with initial HEP    Status  Achieved        PT Long Term Goals - 06/18/18 1018      PT LONG TERM GOAL #1   Title  understand RICE    Status  Achieved      PT LONG TERM GOAL #2   Title  walk without device all community distances    Status  On-going            Plan - 06/22/18 1617    Clinical Impression Statement  Patient with increased pain over the weekend in the medial knee.  She is unsure of why but reports that it could be her using the bike and doing partial revolutions.  She remains very gaurded and resistant to motions, pain is mostly medial.  She is unable to allow PROM    PT Next Visit Plan  continue working on ROM, see if pain comes down    Consulted and Agree with Plan of Care  Patient       Patient will benefit from skilled therapeutic  intervention in order to improve the following deficits and impairments:  Abnormal gait, Decreased range of motion, Difficulty walking, Pain, Decreased balance, Decreased mobility, Decreased strength, Increased edema  Visit Diagnosis: Localized edema  Acute pain of left knee  Difficulty in walking, not elsewhere classified  Stiffness of left knee, not elsewhere classified     Problem List Patient Active Problem List   Diagnosis Date Noted  . S/P total knee replacement 02/23/2018  . Essential hypertension 08/08/2015  . Osteoporosis 06/15/2014    Jearld Lesch., PT 06/22/2018, 4:41 PM  [facility] [address] [place], [place], [address] Phone: [phone]   Fax:  [phone]  Name: Lisa Wheeler MRN: [id number] Date of Birth: 12-26-1950

## 2018-06-25 ENCOUNTER — Encounter: Payer: Self-pay | Admitting: Physical Therapy

## 2018-06-25 ENCOUNTER — Ambulatory Visit: Payer: Medicare Other | Admitting: Physical Therapy

## 2018-06-25 DIAGNOSIS — M25562 Pain in left knee: Secondary | ICD-10-CM | POA: Diagnosis not present

## 2018-06-25 DIAGNOSIS — R262 Difficulty in walking, not elsewhere classified: Secondary | ICD-10-CM

## 2018-06-25 DIAGNOSIS — M25662 Stiffness of left knee, not elsewhere classified: Secondary | ICD-10-CM

## 2018-06-25 DIAGNOSIS — R600 Localized edema: Secondary | ICD-10-CM

## 2018-06-25 NOTE — Therapy (Signed)
[facility] [address] [place], [place], [address] Phone: [phone]   Fax:  [phone]  Physical Therapy Treatment  Patient Details  Name: Lisa Wheeler MRN: [id number] Date of Birth: 07-25-1951 Referring Provider (PT): Lucey   Encounter Date: 06/25/2018  PT End of Session - 06/25/18 1139    Visit Number  6    Date for PT Re-Evaluation  08/08/18    PT Start Time  1052    PT Stop Time  1151    PT Time Calculation (min)  59 min    Activity Tolerance  Patient limited by pain    Behavior During Therapy  Anxious       Past Medical History:  Diagnosis Date  . Arthritis   . Elevated lipids   . Hypertension   . Osteoarthritis of right knee    Severe  . Osteopenia    right hip    Past Surgical History:  Procedure Laterality Date  . CLOSED REDUCTION HAND FRACTURE    . OOPHORECTOMY     right  . TOTAL KNEE ARTHROPLASTY Right 02/23/2018   Procedure: TOTAL KNEE ARTHROPLASTY;  Surgeon: Dannielle Huh, MD;  [facility];  Service: Orthopedics;  Laterality: Right;  . TOTAL KNEE ARTHROPLASTY Left 06/01/2018   Procedure: LEFT TOTAL KNEE ARTHROPLASTY;  Surgeon: Dannielle Huh, MD;  [facility];  Service: Orthopedics;  Laterality: Left;  Adductor Block  . TUBAL LIGATION  1985  . WRIST SURGERY  2/12    There were no vitals filed for this visit.  Subjective Assessment - 06/25/18 1052    Subjective  Pt reports that her knee has not improved from last treatment session. Stated that she has been icing her knee and she decreased the amount of time on her bike    Currently in Pain?  Yes    Pain Score  6     Pain Orientation  Right;Medial                       [facility] Adult PT Treatment/Exercise - 06/25/18 0001      Knee/Hip Exercises: Aerobic   Nustep  level 3 x 6 minutes       Knee/Hip Exercises: Seated   Hamstring Curl  Left;2 sets;10 reps    Hamstring Limitations  green Tband      Knee/Hip  Exercises: Supine   Short Arc Quad Sets  Left;3 sets;10 reps    Heel Slides  Left;10 reps;2 sets    Heel Slides Limitations  PT over pressure      Modalities   Modalities  Vasopneumatic      Vasopneumatic   Number Minutes Vasopneumatic   15 minutes    Vasopnuematic Location   Knee    Vasopneumatic Pressure  Medium    Vasopneumatic Temperature   37      Manual Therapy   Manual Therapy  Passive ROM;Soft tissue mobilization    Joint Mobilization  gentle mobilizations increase L knee flexion    Soft tissue mobilization  to quad     Passive ROM  PROM taken to end range and held, worked on flexion and extension               PT Short Term Goals - 06/18/18 1018      PT SHORT TERM GOAL #1   Title  indepdndent with initial HEP    Status  Achieved  PT Long Term Goals - 06/18/18 1018      PT LONG TERM GOAL #1   Title  understand RICE    Status  Achieved      PT LONG TERM GOAL #2   Title  walk without device all community distances    Status  On-going            Plan - 06/25/18 1140    Clinical Impression Statement  Pt reports that her recent pain has not changed. Backed treatment interventions down to see it that helps. Some increase in L knee flexion after jt mobs. She remains very guarded with PROM. Pain remains mostly medial.     Rehab Potential  Good    PT Frequency  3x / week    PT Duration  8 weeks    PT Treatment/Interventions  ADLs/Self Care Home Management;Cryotherapy;Electrical Stimulation;Gait training;Stair training;Functional mobility training;Therapeutic activities;Balance training;Therapeutic exercise;Manual techniques;Vasopneumatic Device    PT Next Visit Plan  continue working on ROM, see if pain comes down       Patient will benefit from skilled therapeutic intervention in order to improve the following deficits and impairments:  Abnormal gait, Decreased range of motion, Difficulty walking, Pain, Decreased balance, Decreased mobility,  Decreased strength, Increased edema  Visit Diagnosis: Localized edema  Acute pain of left knee  Difficulty in walking, not elsewhere classified  Stiffness of left knee, not elsewhere classified     Problem List Patient Active Problem List   Diagnosis Date Noted  . S/P total knee replacement 02/23/2018  . Essential hypertension 08/08/2015  . Osteoporosis 06/15/2014    Grayce Sessions, PTA 06/25/2018, 11:43 AM  [facility] [address] [place], [place], [address] Phone: [phone]   Fax:  [phone]  Name: Lisa Wheeler MRN: [id number] Date of Birth: [birth year]

## 2018-06-29 ENCOUNTER — Ambulatory Visit: Payer: Medicare Other | Admitting: Physical Therapy

## 2018-06-29 ENCOUNTER — Encounter: Payer: Self-pay | Admitting: Physical Therapy

## 2018-06-29 DIAGNOSIS — R600 Localized edema: Secondary | ICD-10-CM

## 2018-06-29 DIAGNOSIS — M25562 Pain in left knee: Secondary | ICD-10-CM | POA: Diagnosis not present

## 2018-06-29 DIAGNOSIS — M25662 Stiffness of left knee, not elsewhere classified: Secondary | ICD-10-CM

## 2018-06-29 DIAGNOSIS — R262 Difficulty in walking, not elsewhere classified: Secondary | ICD-10-CM

## 2018-06-29 NOTE — Therapy (Signed)
[facility] [place], [place], [address] Phone: [phone]   Fax:  [phone]  Physical Therapy Treatment  Patient Details  Name: KATESSA ATTRIDGE MRN: [id number] Date of Birth: 29-Jul-1951 Referring Provider (PT): Lucey   Encounter Date: 06/29/2018  PT End of Session - 06/29/18 1446    Visit Number  7    Date for PT Re-Evaluation  08/08/18    PT Start Time  3220    PT Stop Time  1445    PT Time Calculation (min)  50 min    Activity Tolerance  Patient tolerated treatment well    Behavior During Therapy  [facility] for tasks assessed/performed;Anxious       Past Medical History:  Diagnosis Date  . Arthritis   . Elevated lipids   . Hypertension   . Osteoarthritis of right knee    Severe  . Osteopenia    right hip    Past Surgical History:  Procedure Laterality Date  . CLOSED REDUCTION HAND FRACTURE    . OOPHORECTOMY     right  . TOTAL KNEE ARTHROPLASTY Right 02/23/2018   Procedure: TOTAL KNEE ARTHROPLASTY;  Surgeon: Vickey Huger, MD;  Location: [facility];  Service: Orthopedics;  Laterality: Right;  . TOTAL KNEE ARTHROPLASTY Left 06/01/2018   Procedure: LEFT TOTAL KNEE ARTHROPLASTY;  Surgeon: Vickey Huger, MD;  [facility];  Service: Orthopedics;  Laterality: Left;  Adductor Block  . TUBAL LIGATION  1985  . WRIST SURGERY  2/12    There were no vitals filed for this visit.  Subjective Assessment - 06/29/18 1408    Subjective  Patient reports that she is feeling better, still frustrated that she cannot bend it and is hurting    Currently in Pain?  Yes    Pain Score  3     Pain Location  Knee    Pain Orientation  Left    Pain Descriptors / Indicators  Aching;Sore    Aggravating Factors   bending                       [facility] Adult PT Treatment/Exercise - 06/29/18 0001      Ambulation/Gait   Gait Comments  no device, patient outside around the building, cues for longer steps and  for heel to toe pattern, tends to keep knees in some flexion, she also scuffs the heels      High Level Balance   High Level Balance Activities  Backward walking    High Level Balance Comments  resisted gait all directions      Knee/Hip Exercises: Aerobic   Nustep  level 3 x 6 minutes       Knee/Hip Exercises: Machines for Strengthening   Cybex Knee Extension  no weight 2x10 with over pressure into flexion between sets for LLLD stretches    Cybex Knee Flexion  20# x10, then left only 15# 2x10      Knee/Hip Exercises: Supine   Heel Slides Limitations  feet on ball K2C with PT overpressure, then feet on ball bridges      Manual Therapy   Manual Therapy  Passive ROM;Soft tissue mobilization    Soft tissue mobilization  to the quad and the scar    Passive ROM  PROM for flexion and extension, some contract relx               PT Short Term Goals - 06/18/18 1018  PT SHORT TERM GOAL #1   Title  indepdndent with initial HEP    Status  Achieved        PT Long Term Goals - 06/29/18 1448      PT LONG TERM GOAL #1   Title  understand RICE    Status  Achieved      PT LONG TERM GOAL #2   Title  walk without device all community distances    Status  Partially Met      PT LONG TERM GOAL #3   Title  increase AROM to 5-115 degrees flexion    Status  On-going      PT LONG TERM GOAL #4   Title  decrease pain 50%    Status  On-going            Plan - 06/29/18 1446    Clinical Impression Statement  Patient reports that she is back to having less pain, she still is hurting, she has difficulty allowing PROM, tends to fight , guard and elevate the hip.  With walking she tends to keep knees bent and scuffs heels as she takes small steps    PT Next Visit Plan  work on the ROM and function    Consulted and Agree with Plan of Care  Patient       Patient will benefit from skilled therapeutic intervention in order to improve the following deficits and impairments:   Abnormal gait, Decreased range of motion, Difficulty walking, Pain, Decreased balance, Decreased mobility, Decreased strength, Increased edema  Visit Diagnosis: Acute pain of left knee  Difficulty in walking, not elsewhere classified  Stiffness of left knee, not elsewhere classified  Localized edema     Problem List Patient Active Problem List   Diagnosis Date Noted  . S/P total knee replacement 02/23/2018  . Essential hypertension 08/08/2015  . Osteoporosis 06/15/2014    Sumner Boast., PT 06/29/2018, 2:48 PM  [place] [address] [place], [place], [address] Phone: [phone]   Fax:  [phone]  Name: FILOMENA POKORNEY MRN: [id number] Date of Birth: 1951-08-27

## 2018-07-02 ENCOUNTER — Encounter: Payer: Self-pay | Admitting: Physical Therapy

## 2018-07-02 ENCOUNTER — Ambulatory Visit: Payer: Medicare Other | Admitting: Physical Therapy

## 2018-07-02 DIAGNOSIS — R262 Difficulty in walking, not elsewhere classified: Secondary | ICD-10-CM

## 2018-07-02 DIAGNOSIS — M25562 Pain in left knee: Secondary | ICD-10-CM

## 2018-07-02 DIAGNOSIS — R600 Localized edema: Secondary | ICD-10-CM

## 2018-07-02 DIAGNOSIS — M25662 Stiffness of left knee, not elsewhere classified: Secondary | ICD-10-CM

## 2018-07-02 NOTE — Therapy (Signed)
[place], [place], [address] Phone: [phone]   Fax:  [phone]  Physical Therapy Treatment  Patient Details  Name: Lisa Wheeler MRN: [id number] Date of Birth: 1951-09-01 Referring Provider (PT): Lucey   Encounter Date: 07/02/2018  PT End of Session - 07/02/18 1153    Visit Number  8    Date for PT Re-Evaluation  08/08/18    PT Start Time  1053    PT Stop Time  1142    PT Time Calculation (min)  49 min    Activity Tolerance  Patient tolerated treatment well    Behavior During Therapy  [facility] for tasks assessed/performed;Anxious       Past Medical History:  Diagnosis Date  . Arthritis   . Elevated lipids   . Hypertension   . Osteoarthritis of right knee    Severe  . Osteopenia    right hip    Past Surgical History:  Procedure Laterality Date  . CLOSED REDUCTION HAND FRACTURE    . OOPHORECTOMY     right  . TOTAL KNEE ARTHROPLASTY Right 02/23/2018   Procedure: TOTAL KNEE ARTHROPLASTY;  Surgeon: Vickey Huger, MD;  Location: [facility];  Service: Orthopedics;  Laterality: Right;  . TOTAL KNEE ARTHROPLASTY Left 06/01/2018   Procedure: LEFT TOTAL KNEE ARTHROPLASTY;  Surgeon: Vickey Huger, MD;  [facility];  Service: Orthopedics;  Laterality: Left;  Adductor Block  . TUBAL LIGATION  1985  . WRIST SURGERY  2/12    There were no vitals filed for this visit.  Subjective Assessment - 07/02/18 1103    Subjective  Patient reports that she went for a walk, is feeling good aobut her ROM as well, says she is having less pain    Currently in Pain?  Yes    Pain Score  2     Pain Location  Knee    Pain Orientation  Left    Pain Descriptors / Indicators  Aching         [facility] PT Assessment - 07/02/18 0001      AROM   Left Knee Flexion  90      PROM   Left Knee Flexion  100                   [facility] Adult PT Treatment/Exercise - 07/02/18 0001      Knee/Hip Exercises:  Aerobic   Nustep  level 4 x 6 minutes       Knee/Hip Exercises: Machines for Strengthening   Cybex Knee Extension  5# 2x10 with some over pressure into flexion for stretch    Cybex Knee Flexion  20# x10, then left only 15# 2x10    Cybex Leg Press  20lb 2x10, LLE TKE 20lb 2x10       Knee/Hip Exercises: Standing   Other Standing Knee Exercises  TKE ball behind the knee      Knee/Hip Exercises: Supine   Short Arc Quad Sets  Left;3 sets;10 reps;Limitations    Short Arc Quad Sets Limitations  3#      Manual Therapy   Manual Therapy  Passive ROM;Soft tissue mobilization    Soft tissue mobilization  to the quad and the scar    Passive ROM  PROM for flexion and extension, some contract relx               PT Short Term Goals - 06/18/18 1018  PT SHORT TERM GOAL #1   Title  indepdndent with initial HEP    Status  Achieved        PT Long Term Goals - 07/02/18 1155      PT LONG TERM GOAL #2   Title  walk without device all community distances    Status  Partially Met      PT LONG TERM GOAL #3   Title  increase AROM to 5-115 degrees flexion    Status  Partially Met      PT LONG TERM GOAL #4   Title  decrease pain 50%    Status  Partially Met            Plan - 07/02/18 1153    Clinical Impression Statement  Patient with good increase in ROM, she also is having less pain.  She gained ROM both passively and actively.  She had less compensation and hip hike iwth PROM, she does have scar puckering    PT Next Visit Plan  continue to work on ROM, strength and function    Consulted and Agree with Plan of Care  Patient       Patient will benefit from skilled therapeutic intervention in order to improve the following deficits and impairments:  Abnormal gait, Decreased range of motion, Difficulty walking, Pain, Decreased balance, Decreased mobility, Decreased strength, Increased edema  Visit Diagnosis: Acute pain of left knee  Difficulty in walking, not elsewhere  classified  Stiffness of left knee, not elsewhere classified  Localized edema     Problem List Patient Active Problem List   Diagnosis Date Noted  . S/P total knee replacement 02/23/2018  . Essential hypertension 08/08/2015  . Osteoporosis 06/15/2014    Lisa Boast., PT 07/02/2018, 11:56 AM  [place] [address] [place], [place], [address] Phone: [phone]   Fax:  [phone]  Name: Lisa Wheeler MRN: [id number] Date of Birth: May 17, 1951

## 2018-07-03 ENCOUNTER — Other Ambulatory Visit: Payer: Self-pay | Admitting: Obstetrics & Gynecology

## 2018-07-03 DIAGNOSIS — Z1231 Encounter for screening mammogram for malignant neoplasm of breast: Secondary | ICD-10-CM

## 2018-07-06 ENCOUNTER — Encounter: Payer: Self-pay | Admitting: Physical Therapy

## 2018-07-06 ENCOUNTER — Ambulatory Visit: Payer: Medicare Other | Attending: Orthopedic Surgery | Admitting: Physical Therapy

## 2018-07-06 DIAGNOSIS — M25662 Stiffness of left knee, not elsewhere classified: Secondary | ICD-10-CM | POA: Diagnosis present

## 2018-07-06 DIAGNOSIS — M25562 Pain in left knee: Secondary | ICD-10-CM | POA: Diagnosis present

## 2018-07-06 DIAGNOSIS — R600 Localized edema: Secondary | ICD-10-CM | POA: Insufficient documentation

## 2018-07-06 DIAGNOSIS — R262 Difficulty in walking, not elsewhere classified: Secondary | ICD-10-CM | POA: Diagnosis present

## 2018-07-06 NOTE — Therapy (Signed)
[place], [place], [address] Phone: [phone]   Fax:  [phone]  Physical Therapy Treatment  Patient Details  Name: Lisa Wheeler MRN: [id number] Date of Birth: 12-28-1950 Referring Provider (PT): Lucey   Encounter Date: 07/06/2018  PT End of Session - 07/06/18 1758    Visit Number  9    Date for PT Re-Evaluation  08/08/18    PT Start Time  1648    PT Stop Time  1745    PT Time Calculation (min)  57 min    Activity Tolerance  Patient tolerated treatment well    Behavior During Therapy  [facility] for tasks assessed/performed;Anxious       Past Medical History:  Diagnosis Date  . Arthritis   . Elevated lipids   . Hypertension   . Osteoarthritis of right knee    Severe  . Osteopenia    right hip    Past Surgical History:  Procedure Laterality Date  . CLOSED REDUCTION HAND FRACTURE    . OOPHORECTOMY     right  . TOTAL KNEE ARTHROPLASTY Right 02/23/2018   Procedure: TOTAL KNEE ARTHROPLASTY;  Surgeon: Vickey Huger, MD;  Location: [facility];  Service: Orthopedics;  Laterality: Right;  . TOTAL KNEE ARTHROPLASTY Left 06/01/2018   Procedure: LEFT TOTAL KNEE ARTHROPLASTY;  Surgeon: Vickey Huger, MD;  [facility];  Service: Orthopedics;  Laterality: Left;  Adductor Block  . TUBAL LIGATION  1985  . WRIST SURGERY  2/12    There were no vitals filed for this visit.  Subjective Assessment - 07/06/18 1649    Subjective  Patient reports "feels tight" reports that she is trying to get better motions "but it is slow"    Currently in Pain?  Yes    Pain Score  2     Pain Location  Knee    Pain Orientation  Left    Pain Descriptors / Indicators  Tightness    Aggravating Factors   bending                       [facility] Adult PT Treatment/Exercise - 07/06/18 0001      Exercises   Exercises  Knee/Hip      Knee/Hip Exercises: Aerobic   Elliptical  R=5 I=10 x 4 minutes    Recumbent Bike  partial revolutions 4 minutes    Nustep  level 4 x 6 minutes       Knee/Hip Exercises: Machines for Strengthening   Cybex Knee Extension  5# 2x10 with some over pressure into flexion for stretch    Cybex Knee Flexion  20# x10, then left only 15# 2x10    Cybex Leg Press  20# and no weight worked on going lower able to go to position #5      Knee/Hip Exercises: Standing   Other Standing Knee Exercises  work on [employer] and [employer]x for proprioception    Other Standing Knee Exercises  4" and 6" step downs      Manual Therapy   Manual Therapy  Passive ROM;Soft tissue mobilization    Soft tissue mobilization  to the quad and the scar    Passive ROM  PROM for flexion and extension, some contract relx, also tried her in prone               PT Short Term Goals - 06/18/18 1018      PT SHORT TERM  GOAL #1   Title  indepdndent with initial HEP    Status  Achieved        PT Long Term Goals - 07/02/18 1155      PT LONG TERM GOAL #2   Title  walk without device all community distances    Status  Partially Met      PT LONG TERM GOAL #3   Title  increase AROM to 5-115 degrees flexion    Status  Partially Met      PT LONG TERM GOAL #4   Title  decrease pain 50%    Status  Partially Met            Plan - 07/06/18 1759    Clinical Impression Statement  Patient seems to really be making good progress, she is showing better ROM, able to do the elliptical.  I tried some stretching with her prone and this was very tight    PT Next Visit Plan  continue to work on ROM, strength and function    Consulted and Agree with Plan of Care  Patient       Patient will benefit from skilled therapeutic intervention in order to improve the following deficits and impairments:  Abnormal gait, Decreased range of motion, Difficulty walking, Pain, Decreased balance, Decreased mobility, Decreased strength, Increased edema  Visit Diagnosis: Acute pain of left knee  Difficulty in  walking, not elsewhere classified  Stiffness of left knee, not elsewhere classified     Problem List Patient Active Problem List   Diagnosis Date Noted  . S/P total knee replacement 02/23/2018  . Essential hypertension 08/08/2015  . Osteoporosis 06/15/2014    Sumner Boast., PT 07/06/2018, 6:01 PM  [place] [address] [place], [place], [address] Phone: [phone]   Fax:  [phone]  Name: Lisa Wheeler MRN: [id number] Date of Birth: [birth year]

## 2018-07-09 ENCOUNTER — Ambulatory Visit: Payer: Medicare Other | Admitting: Physical Therapy

## 2018-07-09 ENCOUNTER — Encounter: Payer: Self-pay | Admitting: Physical Therapy

## 2018-07-09 DIAGNOSIS — M25662 Stiffness of left knee, not elsewhere classified: Secondary | ICD-10-CM

## 2018-07-09 DIAGNOSIS — R262 Difficulty in walking, not elsewhere classified: Secondary | ICD-10-CM

## 2018-07-09 DIAGNOSIS — M25562 Pain in left knee: Secondary | ICD-10-CM | POA: Diagnosis not present

## 2018-07-09 NOTE — Therapy (Signed)
[facility] [address] [place], [place], [address] Phone: [phone]   Fax:  [phone]  Physical Therapy Treatment  Patient Details  Name: ANDREIA GANDOLFI MRN: [id number] Date of Birth: 11-14-1950 Referring Provider (PT): Lucey   Encounter Date: 07/09/2018  PT End of Session - 07/09/18 1652    Visit Number  10    Date for PT Re-Evaluation  08/08/18    PT Start Time  1605    PT Stop Time  1654    PT Time Calculation (min)  49 min    Activity Tolerance  Patient tolerated treatment well    Behavior During Therapy  [facility] for tasks assessed/performed;Anxious       Past Medical History:  Diagnosis Date  . Arthritis   . Elevated lipids   . Hypertension   . Osteoarthritis of right knee    Severe  . Osteopenia    right hip    Past Surgical History:  Procedure Laterality Date  . CLOSED REDUCTION HAND FRACTURE    . OOPHORECTOMY     right  . TOTAL KNEE ARTHROPLASTY Right 02/23/2018   Procedure: TOTAL KNEE ARTHROPLASTY;  Surgeon: Dannielle Huh, MD;  [facility];  Service: Orthopedics;  Laterality: Right;  . TOTAL KNEE ARTHROPLASTY Left 06/01/2018   Procedure: LEFT TOTAL KNEE ARTHROPLASTY;  Surgeon: Dannielle Huh, MD;  Location: [facility];  Service: Orthopedics;  Laterality: Left;  Adductor Block  . TUBAL LIGATION  1985  . WRIST SURGERY  2/12    There were no vitals filed for this visit.  Subjective Assessment - 07/09/18 1612    Subjective  Patient reports that she tried the bike at home but could not go around    Currently in Pain?  No/denies         [facility] PT Assessment - 07/09/18 0001      AROM   Left Knee Flexion  95      PROM   Left Knee Flexion  104      Ambulation/Gait   Gait Comments  worked on step over step pattern up and down stairs, able to do with hand rail and one hand assist                   [facility] Adult PT Treatment/Exercise - 07/09/18 0001      Exercises   Exercises   Knee/Hip      Knee/Hip Exercises: Aerobic   Elliptical  R=5 I=12 x 5 minutes    Tread Mill  off having her ty to push and pull 20 seconds at a time    Recumbent Bike  full revolutions x 3 minutes seat position 8      Knee/Hip Exercises: Machines for Strengthening   Cybex Knee Extension  5# 3x8 left leg only    Cybex Knee Flexion  25# x10, then left only 15# 2x10    Cybex Leg Press  20# and no weight worked on going lower able to go to position #4               PT Short Term Goals - 06/18/18 1018      PT SHORT TERM GOAL #1   Title  indepdndent with initial HEP    Status  Achieved        PT Long Term Goals - 07/09/18 1657      PT LONG TERM GOAL #1   Title  understand RICE    Status  Achieved      PT LONG TERM GOAL #2   Title  walk without device all community distances    Status  Achieved            Plan - 07/09/18 1655    Clinical Impression Statement  Patient really working hard and making good progress with her ROM, there is less guarding and pain but she is still hesitant and c/o tightness    PT Next Visit Plan  write MD note    Consulted and Agree with Plan of Care  Patient       Patient will benefit from skilled therapeutic intervention in order to improve the following deficits and impairments:  Abnormal gait, Decreased range of motion, Difficulty walking, Pain, Decreased balance, Decreased mobility, Decreased strength, Increased edema  Visit Diagnosis: Acute pain of left knee  Difficulty in walking, not elsewhere classified  Stiffness of left knee, not elsewhere classified     Problem List Patient Active Problem List   Diagnosis Date Noted  . S/P total knee replacement 02/23/2018  . Essential hypertension 08/08/2015  . Osteoporosis 06/15/2014    Jearld Lesch., PT  07/09/2018, 4:57 PM  [facility] [address] [place], [place], [address] Phone: [phone]   Fax:   [phone]  Name: LEIANI ENRIGHT MRN: [id number] Date of Birth: [birth year]

## 2018-07-13 ENCOUNTER — Encounter: Payer: Self-pay | Admitting: Physical Therapy

## 2018-07-13 ENCOUNTER — Ambulatory Visit: Payer: Medicare Other | Admitting: Physical Therapy

## 2018-07-13 DIAGNOSIS — M25562 Pain in left knee: Secondary | ICD-10-CM | POA: Diagnosis not present

## 2018-07-13 DIAGNOSIS — R262 Difficulty in walking, not elsewhere classified: Secondary | ICD-10-CM

## 2018-07-13 DIAGNOSIS — R600 Localized edema: Secondary | ICD-10-CM

## 2018-07-13 DIAGNOSIS — M25662 Stiffness of left knee, not elsewhere classified: Secondary | ICD-10-CM

## 2018-07-13 NOTE — Therapy (Signed)
[place], [place], [address] Phone: [phone]   Fax:  [phone]  Physical Therapy Treatment  Patient Details  Name: BRYSTAL KILDOW MRN: [id number] Date of Birth: Feb 03, 1951 Referring Provider (PT): Lucey   Encounter Date: 07/13/2018  PT End of Session - 07/13/18 1509    Visit Number  11    Date for PT Re-Evaluation  08/08/18    PT Start Time  1430    PT Stop Time  1515    PT Time Calculation (min)  45 min    Activity Tolerance  Patient tolerated treatment well    Behavior During Therapy  [facility] for tasks assessed/performed;Anxious       Past Medical History:  Diagnosis Date  . Arthritis   . Elevated lipids   . Hypertension   . Osteoarthritis of right knee    Severe  . Osteopenia    right hip    Past Surgical History:  Procedure Laterality Date  . CLOSED REDUCTION HAND FRACTURE    . OOPHORECTOMY     right  . TOTAL KNEE ARTHROPLASTY Right 02/23/2018   Procedure: TOTAL KNEE ARTHROPLASTY;  Surgeon: Vickey Huger, MD;  Location: [place];  Service: Orthopedics;  Laterality: Right;  . TOTAL KNEE ARTHROPLASTY Left 06/01/2018   Procedure: LEFT TOTAL KNEE ARTHROPLASTY;  Surgeon: Vickey Huger, MD;  [facility];  Service: Orthopedics;  Laterality: Left;  Adductor Block  . TUBAL LIGATION  1985  . WRIST SURGERY  2/12    There were no vitals filed for this visit.  Subjective Assessment - 07/13/18 1437    Subjective  Reports a little more left hip pain after the last visit, possibly due to some compensation    Currently in Pain?  No/denies         [facility] PT Assessment - 07/13/18 0001      AROM   Left Knee Extension  12    Left Knee Flexion  96      PROM   Left Knee Extension  8    Left Knee Flexion  105                   [facility] Adult PT Treatment/Exercise - 07/13/18 0001      Ambulation/Gait   Gait Comments  worked on step over step pattern up and down stairs,  able to do with hand rail       Knee/Hip Exercises: Aerobic   Elliptical  R=5 I=12 x 5 minutes    Recumbent Bike  full revolutions x 3 minutes seat position 8      Knee/Hip Exercises: Machines for Strengthening   Cybex Knee Extension  5# 3x8 left leg only    Cybex Knee Flexion  20# x 10, then 15# 2x10    Cybex Leg Press  20# and no weight worked on going lower able to go to position #4      Knee/Hip Exercises: Standing   Other Standing Knee Exercises  4" and 6" step downs               PT Short Term Goals - 06/18/18 1018      PT SHORT TERM GOAL #1   Title  indepdndent with initial HEP    Status  Achieved        PT Long Term Goals - 07/13/18 1512      PT LONG TERM GOAL #1   Title  understand RICE  Status  Achieved      PT LONG TERM GOAL #2   Title  walk without device all community distances    Status  Achieved      PT LONG TERM GOAL #3   Title  increase AROM to 5-115 degrees flexion    Status  Partially Met      PT LONG TERM GOAL #4   Title  decrease pain 50%    Status  Achieved      PT LONG TERM GOAL #5   Title  sleep > 5 hours    Status  Achieved            Plan - 07/13/18 1510    Clinical Impression Statement  Patient had a set back about 3-4 weeks ago, she felt like it was her doing too much on the bike.  She was very stiff and sore for over a wek after that, she is really starting to make progress with her ROM.  PROM 8-105 degrees flexion.  She is walking without a device, she is now able to go up and down stairs step over step.      PT Next Visit Plan  We would like to continue to push her ROM and assure that there are no set back prior to discharge    Consulted and Agree with Plan of Care  Patient       Patient will benefit from skilled therapeutic intervention in order to improve the following deficits and impairments:  Abnormal gait, Decreased range of motion, Difficulty walking, Pain, Decreased balance, Decreased mobility, Decreased  strength, Increased edema  Visit Diagnosis: Acute pain of left knee  Difficulty in walking, not elsewhere classified  Stiffness of left knee, not elsewhere classified  Localized edema     Problem List Patient Active Problem List   Diagnosis Date Noted  . S/P total knee replacement 02/23/2018  . Essential hypertension 08/08/2015  . Osteoporosis 06/15/2014    Sumner Boast., PT 07/13/2018, 3:13 PM  [place] [address] [place], [place], [address] Phone: [phone]   Fax:  [phone]  Name: MARYGRACE SANDOVAL MRN: [id number] Date of Birth: 04/01/1951

## 2018-07-16 ENCOUNTER — Ambulatory Visit: Payer: Medicare Other | Admitting: Physical Therapy

## 2018-07-16 ENCOUNTER — Encounter: Payer: Self-pay | Admitting: Physical Therapy

## 2018-07-16 DIAGNOSIS — R600 Localized edema: Secondary | ICD-10-CM

## 2018-07-16 DIAGNOSIS — M25562 Pain in left knee: Secondary | ICD-10-CM | POA: Diagnosis not present

## 2018-07-16 DIAGNOSIS — R262 Difficulty in walking, not elsewhere classified: Secondary | ICD-10-CM

## 2018-07-16 DIAGNOSIS — M25662 Stiffness of left knee, not elsewhere classified: Secondary | ICD-10-CM

## 2018-07-16 NOTE — Therapy (Signed)
[place], [place], [address] Phone: [phone]   Fax:  [phone]  Physical Therapy Treatment  Patient Details  Name: Lisa Wheeler MRN: [id number] Date of Birth: 08/01/1951 Referring Provider (PT): Lucey   Encounter Date: 07/16/2018  PT End of Session - 07/16/18 1352    Visit Number  12    Date for PT Re-Evaluation  08/08/18    PT Start Time  1307    PT Stop Time  1351    PT Time Calculation (min)  44 min    Activity Tolerance  Patient tolerated treatment well    Behavior During Therapy  [facility] for tasks assessed/performed       Past Medical History:  Diagnosis Date  . Arthritis   . Elevated lipids   . Hypertension   . Osteoarthritis of right knee    Severe  . Osteopenia    right hip    Past Surgical History:  Procedure Laterality Date  . CLOSED REDUCTION HAND FRACTURE    . OOPHORECTOMY     right  . TOTAL KNEE ARTHROPLASTY Right 02/23/2018   Procedure: TOTAL KNEE ARTHROPLASTY;  Surgeon: Vickey Huger, MD;  Location: [place];  Service: Orthopedics;  Laterality: Right;  . TOTAL KNEE ARTHROPLASTY Left 06/01/2018   Procedure: LEFT TOTAL KNEE ARTHROPLASTY;  Surgeon: Vickey Huger, MD;  [facility];  Service: Orthopedics;  Laterality: Left;  Adductor Block  . TUBAL LIGATION  1985  . WRIST SURGERY  2/12    There were no vitals filed for this visit.  Subjective Assessment - 07/16/18 1312    Subjective  Saw MD, "they said I was doing great"    Currently in Pain?  No/denies                       [facility] Adult PT Treatment/Exercise - 07/16/18 0001      Ambulation/Gait   Gait Comments  worked on step over step pattern up and down stairs, able to do with hand rail       Knee/Hip Exercises: Aerobic   Elliptical  R=5 I=12 x 5 minutes    Recumbent Bike  full revolutions x 4 minutes seat position 8      Knee/Hip Exercises: Machines for Strengthening   Cybex Knee  Extension  5# 3x8 left leg only    Cybex Knee Flexion  20# x 10, then left leg only15# 2x10    Cybex Leg Press  20# and no weight worked on going lower able to go to position #3      Knee/Hip Exercises: Supine   Short Arc [employer]  Both;3 sets;10 reps    Short Arc Quad Sets Limitations  3#      Manual Therapy   Manual Therapy  Passive ROM;Soft tissue mobilization    Passive ROM  PROM for flexion and extension, some contract relax, worked a little more on both knees for extension               PT Short Term Goals - 06/18/18 1018      PT SHORT TERM GOAL #1   Title  indepdndent with initial HEP    Status  Achieved        PT Long Term Goals - 07/13/18 1512      PT LONG TERM GOAL #1   Title  understand RICE    Status  Achieved  PT LONG TERM GOAL #2   Title  walk without device all community distances    Status  Achieved      PT LONG TERM GOAL #3   Title  increase AROM to 5-115 degrees flexion    Status  Partially Met      PT LONG TERM GOAL #4   Title  decrease pain 50%    Status  Achieved      PT LONG TERM GOAL #5   Title  sleep > 5 hours    Status  Achieved            Plan - 07/16/18 1352    Clinical Impression Statement  Patient doing very well, the right knee actually has less extension than the new surgical knee, so we worked some more on this today.  The stairs were very easy and she was able to go step over step on the stairs without difficulty    PT Next Visit Plan  continue to work on ROM    Consulted and Agree with Plan of Care  Patient       Patient will benefit from skilled therapeutic intervention in order to improve the following deficits and impairments:  Abnormal gait, Decreased range of motion, Difficulty walking, Pain, Decreased balance, Decreased mobility, Decreased strength, Increased edema  Visit Diagnosis: Acute pain of left knee  Difficulty in walking, not elsewhere classified  Stiffness of left knee, not elsewhere  classified  Localized edema     Problem List Patient Active Problem List   Diagnosis Date Noted  . S/P total knee replacement 02/23/2018  . Essential hypertension 08/08/2015  . Osteoporosis 06/15/2014    Sumner Boast., PT 07/16/2018, 1:55 PM  [place] [address] [place], [place], [address] Phone: [phone]   Fax:  [phone]  Name: Lisa Wheeler MRN: [id number] Date of Birth: 03-Jun-1951

## 2018-07-20 ENCOUNTER — Ambulatory Visit: Payer: Medicare Other | Admitting: Physical Therapy

## 2018-07-20 ENCOUNTER — Encounter: Payer: Self-pay | Admitting: Physical Therapy

## 2018-07-20 DIAGNOSIS — M25562 Pain in left knee: Secondary | ICD-10-CM

## 2018-07-20 DIAGNOSIS — M25662 Stiffness of left knee, not elsewhere classified: Secondary | ICD-10-CM

## 2018-07-20 DIAGNOSIS — R262 Difficulty in walking, not elsewhere classified: Secondary | ICD-10-CM

## 2018-07-20 NOTE — Therapy (Signed)
[place], [place], [address] Phone: [phone]   Fax:  [phone]  Physical Therapy Treatment  Patient Details  Name: Lisa Wheeler MRN: [id number] Date of Birth: 08/26/1951 Referring Provider (PT): Lucey   Encounter Date: 07/20/2018  PT End of Session - 07/20/18 1437    Visit Number  13    PT Start Time  7510    PT Stop Time  1438    PT Time Calculation (min)  45 min    Activity Tolerance  Patient tolerated treatment well    Behavior During Therapy  [facility] for tasks assessed/performed       Past Medical History:  Diagnosis Date  . Arthritis   . Elevated lipids   . Hypertension   . Osteoarthritis of right knee    Severe  . Osteopenia    right hip    Past Surgical History:  Procedure Laterality Date  . CLOSED REDUCTION HAND FRACTURE    . OOPHORECTOMY     right  . TOTAL KNEE ARTHROPLASTY Right 02/23/2018   Procedure: TOTAL KNEE ARTHROPLASTY;  Surgeon: Vickey Huger, MD;  Location: [place];  Service: Orthopedics;  Laterality: Right;  . TOTAL KNEE ARTHROPLASTY Left 06/01/2018   Procedure: LEFT TOTAL KNEE ARTHROPLASTY;  Surgeon: Vickey Huger, MD;  [facility];  Service: Orthopedics;  Laterality: Left;  Adductor Block  . TUBAL LIGATION  1985  . WRIST SURGERY  2/12    There were no vitals filed for this visit.  Subjective Assessment - 07/20/18 1355    Subjective  I am able to go around on my bike at home now    Currently in Pain?  No/denies                       [facility] Adult PT Treatment/Exercise - 07/20/18 0001      High Level Balance   High Level Balance Comments  standing balance on Bosu      Knee/Hip Exercises: Aerobic   Elliptical  R=5 I=12 x 5 minutes    Recumbent Bike  full revolutions x 4 minutes seat position 8      Knee/Hip Exercises: Machines for Strengthening   Cybex Knee Extension  15# x10, then 5# left only 2x10    Cybex Knee Flexion  35# x10,  then left only 20# 2x10    Cybex Leg Press  20# and no weight worked on going lower able to go to position #3      Knee/Hip Exercises: Standing   Other Standing Knee Exercises  sports cord walking 40# total  all directions       Knee/Hip Exercises: Supine   Short Arc Quad Sets  Both;3 sets;10 reps    Short Arc Quad Sets Limitations  3#    Heel Slides Limitations  feet on ball K2C with PT overpressure, then feet on ball bridges, HS bridges      Manual Therapy   Manual Therapy  Passive ROM;Soft tissue mobilization    Passive ROM  flexion with the leg hangoing off the bed for greater hip flexor and quad stretch               PT Short Term Goals - 06/18/18 1018      PT SHORT TERM GOAL #1   Title  indepdndent with initial HEP    Status  Achieved        PT Long Term Goals -  07/13/18 1512      PT LONG TERM GOAL #1   Title  understand RICE    Status  Achieved      PT LONG TERM GOAL #2   Title  walk without device all community distances    Status  Achieved      PT LONG TERM GOAL #3   Title  increase AROM to 5-115 degrees flexion    Status  Partially Met      PT LONG TERM GOAL #4   Title  decrease pain 50%    Status  Achieved      PT LONG TERM GOAL #5   Title  sleep > 5 hours    Status  Achieved            Plan - 07/20/18 1438    Clinical Impression Statement  Patient made progress by going around on the bike with out partial revs, able to push back up on the leg press without help at posistion #2.  Is fearful and has difficulty with the bosu    PT Next Visit Plan  ROM, runction    Consulted and Agree with Plan of Care  Patient       Patient will benefit from skilled therapeutic intervention in order to improve the following deficits and impairments:  Abnormal gait, Decreased range of motion, Difficulty walking, Pain, Decreased balance, Decreased mobility, Decreased strength, Increased edema  Visit Diagnosis: Acute pain of left knee  Difficulty in  walking, not elsewhere classified  Stiffness of left knee, not elsewhere classified     Problem List Patient Active Problem List   Diagnosis Date Noted  . S/P total knee replacement 02/23/2018  . Essential hypertension 08/08/2015  . Osteoporosis 06/15/2014    Sumner Boast., PT 07/20/2018, 2:39 PM  [place] [address] [place], [place], [address] Phone: [phone]   Fax:  [phone]  Name: Lisa Wheeler MRN: [id number] Date of Birth: 02-Mar-1951

## 2018-07-23 ENCOUNTER — Ambulatory Visit: Payer: Medicare Other | Admitting: Physical Therapy

## 2018-07-23 ENCOUNTER — Encounter: Payer: Self-pay | Admitting: Physical Therapy

## 2018-07-23 DIAGNOSIS — M25562 Pain in left knee: Secondary | ICD-10-CM

## 2018-07-23 DIAGNOSIS — R262 Difficulty in walking, not elsewhere classified: Secondary | ICD-10-CM

## 2018-07-23 DIAGNOSIS — M25662 Stiffness of left knee, not elsewhere classified: Secondary | ICD-10-CM

## 2018-07-23 NOTE — Therapy (Signed)
[place], [place], [address] Phone: [phone]   Fax:  [phone]  Physical Therapy Treatment  Patient Details  Name: Lisa Wheeler MRN: [id number] Date of Birth: 1951-01-28 Referring Provider (PT): Lucey   Encounter Date: 07/23/2018  PT End of Session - 07/23/18 1520    Visit Number  14    Date for PT Re-Evaluation  08/08/18    PT Start Time  1435    PT Stop Time  1521    PT Time Calculation (min)  46 min    Activity Tolerance  Patient tolerated treatment well    Behavior During Therapy  [facility] for tasks assessed/performed       Past Medical History:  Diagnosis Date  . Arthritis   . Elevated lipids   . Hypertension   . Osteoarthritis of right knee    Severe  . Osteopenia    right hip    Past Surgical History:  Procedure Laterality Date  . CLOSED REDUCTION HAND FRACTURE    . OOPHORECTOMY     right  . TOTAL KNEE ARTHROPLASTY Right 02/23/2018   Procedure: TOTAL KNEE ARTHROPLASTY;  Surgeon: Vickey Huger, MD;  Location: [place];  Service: Orthopedics;  Laterality: Right;  . TOTAL KNEE ARTHROPLASTY Left 06/01/2018   Procedure: LEFT TOTAL KNEE ARTHROPLASTY;  Surgeon: Vickey Huger, MD;  [facility];  Service: Orthopedics;  Laterality: Left;  Adductor Block  . TUBAL LIGATION  1985  . WRIST SURGERY  2/12    There were no vitals filed for this visit.  Subjective Assessment - 07/23/18 1442    Subjective  I slept through the night and had a great day yesterday.    Currently in Pain?  No/denies                       [facility] Adult PT Treatment/Exercise - 07/23/18 0001      High Level Balance   High Level Balance Comments  standing balance on Bosu      Knee/Hip Exercises: Aerobic   Elliptical  R=5 I=12 x 6 minutes    Recumbent Bike  full revolutions x 5 minutes seat position 8      Knee/Hip Exercises: Machines for Strengthening   Cybex Knee Extension  15# x10, then  5# left only 2x10    Cybex Knee Flexion  35# x10, then left only 20# 2x10    Cybex Leg Press  20# and no weight worked on going lower able to go to position #2      Knee/Hip Exercises: Standing   Other Standing Knee Exercises  sports cord walking 40# total  all directions     Other Standing Knee Exercises  TKE ball behind the knee      Manual Therapy   Manual Therapy  Passive ROM;Soft tissue mobilization    Passive ROM  flexion with the leg hangoing off the bed for greater hip flexor and quad stretch               PT Short Term Goals - 06/18/18 1018      PT SHORT TERM GOAL #1   Title  indepdndent with initial HEP    Status  Achieved        PT Long Term Goals - 07/23/18 1541      PT LONG TERM GOAL #3   Title  increase AROM to 5-115 degrees flexion    Status  Partially Met            Plan - 07/23/18 1540    Clinical Impression Statement  Patient continues to make preogress, level 7 on the bike, level 2 on the leg press.  She was able to sleep through the night.  Feels good about her ROM    PT Next Visit Plan  blance and ROM    Consulted and Agree with Plan of Care  Patient       Patient will benefit from skilled therapeutic intervention in order to improve the following deficits and impairments:  Abnormal gait, Decreased range of motion, Difficulty walking, Pain, Decreased balance, Decreased mobility, Decreased strength, Increased edema  Visit Diagnosis: Acute pain of left knee  Difficulty in walking, not elsewhere classified  Stiffness of left knee, not elsewhere classified     Problem List Patient Active Problem List   Diagnosis Date Noted  . S/P total knee replacement 02/23/2018  . Essential hypertension 08/08/2015  . Osteoporosis 06/15/2014    Sumner Boast., PT 07/23/2018, 3:41 PM  [place], [place], [address] Phone: [phone]   Fax:   [phone]  Name: Lisa Wheeler MRN: [id number] Date of Birth: [birth year]

## 2018-07-27 ENCOUNTER — Encounter: Payer: Self-pay | Admitting: Physical Therapy

## 2018-07-27 ENCOUNTER — Ambulatory Visit: Payer: Medicare Other | Admitting: Physical Therapy

## 2018-07-27 DIAGNOSIS — M25662 Stiffness of left knee, not elsewhere classified: Secondary | ICD-10-CM

## 2018-07-27 DIAGNOSIS — M25562 Pain in left knee: Secondary | ICD-10-CM | POA: Diagnosis not present

## 2018-07-27 DIAGNOSIS — R262 Difficulty in walking, not elsewhere classified: Secondary | ICD-10-CM

## 2018-07-27 NOTE — Therapy (Signed)
[place], [place], [address] Phone: [phone]   Fax:  [phone]  Physical Therapy Treatment  Patient Details  Name: Lisa Wheeler MRN: [id number] Date of Birth: 04/18/1951 Referring Provider (PT): Lucey   Encounter Date: 07/27/2018  PT End of Session - 07/27/18 1525    Visit Number  15    PT Start Time  1350    PT Stop Time  1433    PT Time Calculation (min)  43 min    Activity Tolerance  Patient tolerated treatment well    Behavior During Therapy  [facility] for tasks assessed/performed       Past Medical History:  Diagnosis Date  . Arthritis   . Elevated lipids   . Hypertension   . Osteoarthritis of right knee    Severe  . Osteopenia    right hip    Past Surgical History:  Procedure Laterality Date  . CLOSED REDUCTION HAND FRACTURE    . OOPHORECTOMY     right  . TOTAL KNEE ARTHROPLASTY Right 02/23/2018   Procedure: TOTAL KNEE ARTHROPLASTY;  Surgeon: Vickey Huger, MD;  Location: [place];  Service: Orthopedics;  Laterality: Right;  . TOTAL KNEE ARTHROPLASTY Left 06/01/2018   Procedure: LEFT TOTAL KNEE ARTHROPLASTY;  Surgeon: Vickey Huger, MD;  [facility];  Service: Orthopedics;  Laterality: Left;  Adductor Block  . TUBAL LIGATION  1985  . WRIST SURGERY  2/12    There were no vitals filed for this visit.  Subjective Assessment - 07/27/18 1350    Subjective  Patient reports that she is doing well, just stiff over the weekend    Currently in Pain?  No/denies                       [facility] Adult PT Treatment/Exercise - 07/27/18 0001      Ambulation/Gait   Gait Comments  gait down stairs step over step and then outside, slight stiff legged      Knee/Hip Exercises: Aerobic   Elliptical  R=5 I=12 x 6 minutes    Recumbent Bike  full revolutions x 5 minutes seat position 8, went to position #6 for 1 minute      Knee/Hip Exercises: Machines for Strengthening    Cybex Knee Extension  15# x10, then 5# left only 2x10    Cybex Knee Flexion  35# x10, then left only 20# 2x10    Cybex Leg Press  20# and no weight worked on going lower able to go to position #1      Knee/Hip Exercises: Standing   Other Standing Knee Exercises  sports cord walking 40# total  all directions       Manual Therapy   Manual Therapy  Passive ROM;Soft tissue mobilization    Passive ROM  flexion with the leg hangoing off the bed for greater hip flexor and quad stretch               PT Short Term Goals - 06/18/18 1018      PT SHORT TERM GOAL #1   Title  indepdndent with initial HEP    Status  Achieved        PT Long Term Goals - 07/27/18 1633      PT LONG TERM GOAL #3   Title  increase AROM to 5-115 degrees flexion    Status  Partially Met      PT LONG TERM  GOAL #4   Title  decrease pain 50%    Status  Achieved            Plan - 07/27/18 1632    Clinical Impression Statement  Patient continues to report doing better, able to go down stairs step over step without difficulty, moved the leg press to position #1 today and could make full revolutions at the end of the bike and position #6., while on the leg press I measured her at 115 degrees flexion    PT Next Visit Plan  work on her function and maximize her ROM    Consulted and Agree with Plan of Care  Patient       Patient will benefit from skilled therapeutic intervention in order to improve the following deficits and impairments:  Abnormal gait, Decreased range of motion, Difficulty walking, Pain, Decreased balance, Decreased mobility, Decreased strength, Increased edema  Visit Diagnosis: Acute pain of left knee  Difficulty in walking, not elsewhere classified  Stiffness of left knee, not elsewhere classified     Problem List Patient Active Problem List   Diagnosis Date Noted  . S/P total knee replacement 02/23/2018  . Essential hypertension 08/08/2015  . Osteoporosis 06/15/2014     Sumner Boast., PT 07/27/2018, 4:34 PM  [place] [address] [place], [place], [address] Phone: [phone]   Fax:  [phone]  Name: Lisa Wheeler MRN: [id number] Date of Birth: 10/16/1950

## 2018-08-03 ENCOUNTER — Ambulatory Visit: Payer: Medicare Other | Attending: Orthopedic Surgery | Admitting: Physical Therapy

## 2018-08-03 ENCOUNTER — Encounter: Payer: Self-pay | Admitting: Physical Therapy

## 2018-08-03 DIAGNOSIS — R262 Difficulty in walking, not elsewhere classified: Secondary | ICD-10-CM | POA: Diagnosis present

## 2018-08-03 DIAGNOSIS — M25562 Pain in left knee: Secondary | ICD-10-CM | POA: Diagnosis not present

## 2018-08-03 DIAGNOSIS — M25662 Stiffness of left knee, not elsewhere classified: Secondary | ICD-10-CM | POA: Insufficient documentation

## 2018-08-03 NOTE — Therapy (Signed)
[place], [place], [address] Phone: [phone]   Fax:  [phone]  Physical Therapy Treatment  Patient Details  Name: Lisa Wheeler MRN: [id number] Date of Birth: 09-07-1950 Referring Provider (PT): Lucey   Encounter Date: 08/03/2018  PT End of Session - 08/03/18 1649    Visit Number  16    Date for PT Re-Evaluation  08/08/18    PT Start Time  1600    PT Stop Time  1649    PT Time Calculation (min)  49 min    Activity Tolerance  Patient tolerated treatment well    Behavior During Therapy  [facility] for tasks assessed/performed       Past Medical History:  Diagnosis Date  . Arthritis   . Elevated lipids   . Hypertension   . Osteoarthritis of right knee    Severe  . Osteopenia    right hip    Past Surgical History:  Procedure Laterality Date  . CLOSED REDUCTION HAND FRACTURE    . OOPHORECTOMY     right  . TOTAL KNEE ARTHROPLASTY Right 02/23/2018   Procedure: TOTAL KNEE ARTHROPLASTY;  Surgeon: Vickey Huger, MD;  Location: [place];  Service: Orthopedics;  Laterality: Right;  . TOTAL KNEE ARTHROPLASTY Left 06/01/2018   Procedure: LEFT TOTAL KNEE ARTHROPLASTY;  Surgeon: Vickey Huger, MD;  [facility];  Service: Orthopedics;  Laterality: Left;  Adductor Block  . TUBAL LIGATION  1985  . WRIST SURGERY  2/12    There were no vitals filed for this visit.  Subjective Assessment - 08/03/18 1606    Subjective  Been doing well, not really having any pain    Currently in Pain?  No/denies                       [facility] Adult PT Treatment/Exercise - 08/03/18 0001      Knee/Hip Exercises: Aerobic   Elliptical  R=5 I=12 x 6 minutes    Recumbent Bike  6 minutes Level 1 at position #7      Knee/Hip Exercises: Machines for Strengthening   Cybex Knee Extension  5# working on the farthest bending    Cybex Knee Flexion  35# x10, then left only 20# 2x10    Cybex Leg Press  20# and  no weight worked on going lower able to go to position to the end      Knee/Hip Exercises: Standing   Other Standing Knee Exercises  sports cord walking 40# total  all directions     Other Standing Knee Exercises  bosu balance      Manual Therapy   Manual Therapy  Passive ROM;Soft tissue mobilization    Passive ROM  flexion with the leg hanging off the bed for greater hip flexor and quad stretch               PT Short Term Goals - 06/18/18 1018      PT SHORT TERM GOAL #1   Title  indepdndent with initial HEP    Status  Achieved        PT Long Term Goals - 07/27/18 1633      PT LONG TERM GOAL #3   Title  increase AROM to 5-115 degrees flexion    Status  Partially Met      PT LONG TERM GOAL #4   Title  decrease pain 50%    Status  Achieved  Plan - 08/03/18 1650    Clinical Impression Statement  Patient measured at 120 degrees on the leg press.  She still with some warmth and swelling, slight quad lag    PT Next Visit Plan  work some on balance and start to educate about D/C    Consulted and Agree with Plan of Care  Patient       Patient will benefit from skilled therapeutic intervention in order to improve the following deficits and impairments:  Abnormal gait, Decreased range of motion, Difficulty walking, Pain, Decreased balance, Decreased mobility, Decreased strength, Increased edema  Visit Diagnosis: Acute pain of left knee  Difficulty in walking, not elsewhere classified  Stiffness of left knee, not elsewhere classified     Problem List Patient Active Problem List   Diagnosis Date Noted  . S/P total knee replacement 02/23/2018  . Essential hypertension 08/08/2015  . Osteoporosis 06/15/2014    Sumner Boast., PT 08/03/2018, 4:55 PM  [place] [address] [place], [place], [address] Phone: [phone]   Fax:  [phone]  Name: Lisa Wheeler MRN:  [id number] Date of Birth: 06-Jul-1951

## 2018-08-06 ENCOUNTER — Ambulatory Visit: Payer: Medicare Other | Admitting: Physical Therapy

## 2018-08-06 ENCOUNTER — Encounter: Payer: Self-pay | Admitting: Physical Therapy

## 2018-08-06 DIAGNOSIS — M25562 Pain in left knee: Secondary | ICD-10-CM | POA: Diagnosis not present

## 2018-08-06 DIAGNOSIS — M25662 Stiffness of left knee, not elsewhere classified: Secondary | ICD-10-CM

## 2018-08-06 DIAGNOSIS — R262 Difficulty in walking, not elsewhere classified: Secondary | ICD-10-CM

## 2018-08-06 NOTE — Therapy (Signed)
[place], [place], [address] Phone: [phone]   Fax:  [phone]  Physical Therapy Treatment  Patient Details  Name: Lisa Wheeler MRN: [id number] Date of Birth: 08-31-1951 Referring Provider (PT): Lucey   Encounter Date: 08/06/2018  PT End of Session - 08/06/18 1747    Visit Number  17    Date for PT Re-Evaluation  08/08/18    PT Start Time  1525    PT Stop Time  1610    PT Time Calculation (min)  45 min    Activity Tolerance  Patient tolerated treatment well    Behavior During Therapy  [facility] for tasks assessed/performed       Past Medical History:  Diagnosis Date  . Arthritis   . Elevated lipids   . Hypertension   . Osteoarthritis of right knee    Severe  . Osteopenia    right hip    Past Surgical History:  Procedure Laterality Date  . CLOSED REDUCTION HAND FRACTURE    . OOPHORECTOMY     right  . TOTAL KNEE ARTHROPLASTY Right 02/23/2018   Procedure: TOTAL KNEE ARTHROPLASTY;  Surgeon: Vickey Huger, MD;  Location: [place];  Service: Orthopedics;  Laterality: Right;  . TOTAL KNEE ARTHROPLASTY Left 06/01/2018   Procedure: LEFT TOTAL KNEE ARTHROPLASTY;  Surgeon: Vickey Huger, MD;  [facility];  Service: Orthopedics;  Laterality: Left;  Adductor Block  . TUBAL LIGATION  1985  . WRIST SURGERY  2/12    There were no vitals filed for this visit.  Subjective Assessment - 08/06/18 1602    Subjective  Reports that she has added 2 minutes on the bike, had some increased warmth and stiffness    Currently in Pain?  No/denies         [facility] PT Assessment - 08/06/18 0001      PROM   Right Knee Flexion  120    Left Knee Flexion  120                   [facility] Adult PT Treatment/Exercise - 08/06/18 0001      Ambulation/Gait   Gait Comments  worked on stairs step over step, this is easy on our 6" stairs we tried an 8" and this was much more difficult with descnding       High Level Balance   High Level Balance Comments  standing balance on Bosu      Knee/Hip Exercises: Aerobic   Elliptical  R=5 I=12 x 6 minutes      Knee/Hip Exercises: Machines for Strengthening   Cybex Leg Press  20# and no weight worked on going lower able to go to position to the end      Knee/Hip Exercises: Standing   Other Standing Knee Exercises  sports cord walking 40# total  all directions       Manual Therapy   Manual Therapy  Passive ROM    Passive ROM  flexion and extension with her sitting and in prone               PT Short Term Goals - 06/18/18 1018      PT SHORT TERM GOAL #1   Title  indepdndent with initial HEP    Status  Achieved        PT Long Term Goals - 08/06/18 1753      PT LONG TERM GOAL #3   Title  increase AROM to 5-115 degrees flexion    Status  Partially Met            Plan - 08/06/18 1748    Clinical Impression Statement  Patient measured 120 degrees both knees passively.  She was a little more stiff today.  She is unsure of why, she does report that she added 2 minutes to the bike.  She overall is doing well and is going to go t [employer] and start looking at the machines so we can talk with her about this.    PT Next Visit Plan  start talking with her about the gym the weights and the reps and how to do safely and progress safely    Consulted and Agree with Plan of Care  Patient       Patient will benefit from skilled therapeutic intervention in order to improve the following deficits and impairments:  Abnormal gait, Decreased range of motion, Difficulty walking, Pain, Decreased balance, Decreased mobility, Decreased strength, Increased edema  Visit Diagnosis: Acute pain of left knee  Difficulty in walking, not elsewhere classified  Stiffness of left knee, not elsewhere classified     Problem List Patient Active Problem List   Diagnosis Date Noted  . S/P total knee replacement 02/23/2018  . Essential hypertension  08/08/2015  . Osteoporosis 06/15/2014    Sumner Boast., PT 08/06/2018, 5:53 PM  [place] [address] [place], [place], [address] Phone: [phone]   Fax:  [phone]  Name: Lisa Wheeler MRN: [id number] Date of Birth: [birth year]

## 2018-08-10 ENCOUNTER — Ambulatory Visit: Payer: Medicare Other | Admitting: Physical Therapy

## 2018-08-10 ENCOUNTER — Encounter: Payer: Self-pay | Admitting: Physical Therapy

## 2018-08-10 DIAGNOSIS — M25662 Stiffness of left knee, not elsewhere classified: Secondary | ICD-10-CM

## 2018-08-10 DIAGNOSIS — R262 Difficulty in walking, not elsewhere classified: Secondary | ICD-10-CM

## 2018-08-10 DIAGNOSIS — M25562 Pain in left knee: Secondary | ICD-10-CM

## 2018-08-10 NOTE — Therapy (Signed)
[place], [place], [address] Phone: [phone]   Fax:  [phone]  Physical Therapy Treatment  Patient Details  Name: Lisa Wheeler MRN: [id number] Date of Birth: [birth year] Referring Provider (PT): Lucey   Encounter Date: 08/10/2018  PT End of Session - 08/10/18 1443    Visit Number  18    Date for PT Re-Evaluation  09/08/18    PT Start Time  1400    PT Stop Time  1444    PT Time Calculation (min)  44 min    Activity Tolerance  Patient tolerated treatment well    Behavior During Therapy  [facility] for tasks assessed/performed       Past Medical History:  Diagnosis Date  . Arthritis   . Elevated lipids   . Hypertension   . Osteoarthritis of right knee    Severe  . Osteopenia    right hip    Past Surgical History:  Procedure Laterality Date  . CLOSED REDUCTION HAND FRACTURE    . OOPHORECTOMY     right  . TOTAL KNEE ARTHROPLASTY Right 02/23/2018   Procedure: TOTAL KNEE ARTHROPLASTY;  Surgeon: Vickey Huger, MD;  Location: [place];  Service: Orthopedics;  Laterality: Right;  . TOTAL KNEE ARTHROPLASTY Left 06/01/2018   Procedure: LEFT TOTAL KNEE ARTHROPLASTY;  Surgeon: Vickey Huger, MD;  [facility];  Service: Orthopedics;  Laterality: Left;  Adductor Block  . TUBAL LIGATION  1985  . WRIST SURGERY  2/12    There were no vitals filed for this visit.  Subjective Assessment - 08/10/18 1358    Subjective  Overall doing well.  Reports that she may have been stiff last week due to not using the bike    Currently in Pain?  No/denies                       [facility] Adult PT Treatment/Exercise - 08/10/18 0001      Ambulation/Gait   Gait Comments  8" step downs      High Level Balance   High Level Balance Comments  on airex head turns and eyes closed      Knee/Hip Exercises: Aerobic   Elliptical  R=5 I=12 x 6 minutes, 2 minutes were backward    Recumbent Bike  6 minutes  Level 1 at position #7      Knee/Hip Exercises: Machines for Strengthening   Cybex Knee Extension  5# working on the farthest bending 2x10 for both legs singly    Cybex Knee Flexion  20# 2x10 single leg both    Cybex Leg Press  20# and no weight worked on going lower able to go to position to the end      Knee/Hip Exercises: Standing   Other Standing Knee Exercises  sports cord walking 40# total  all directions     Other Standing Knee Exercises  bosu balance added ball toss today, wall squats with ball behind back, education on leg press and wall squat with foot and knee posistion for safety      Manual Therapy   Manual Therapy  Passive ROM    Passive ROM  flexion and extension with her sitting and in prone               PT Short Term Goals - 06/18/18 1018      PT SHORT TERM GOAL #1   Title  indepdndent with initial HEP  Status  Achieved        PT Long Term Goals - 08/10/18 1445      PT LONG TERM GOAL #3   Title  increase AROM to 5-115 degrees flexion    Status  Partially Met            Plan - 08/10/18 1444    Clinical Impression Statement  Patient needing some guidance for gym as we are seeing her 1x this week and then 1x next week.  She is trying gym on her own.  ROM is very good but seems to do better after a warm up on the bike    PT Next Visit Plan  see how she does at the gym on own, next visit may be her last    Consulted and Agree with Plan of Care  Patient       Patient will benefit from skilled therapeutic intervention in order to improve the following deficits and impairments:  Abnormal gait, Decreased range of motion, Difficulty walking, Pain, Decreased balance, Decreased mobility, Decreased strength, Increased edema  Visit Diagnosis: Acute pain of left knee - Plan: PT plan of care cert/re-cert  Difficulty in walking, not elsewhere classified - Plan: PT plan of care cert/re-cert  Stiffness of left knee, not elsewhere classified - Plan: PT  plan of care cert/re-cert     Problem List Patient Active Problem List   Diagnosis Date Noted  . S/P total knee replacement 02/23/2018  . Essential hypertension 08/08/2015  . Osteoporosis 06/15/2014    Sumner Boast., PT 08/10/2018, 3:42 PM  [place] [address] [place], [place], [address] Phone: [phone]   Fax:  [phone]  Name: AFRIKA BRICK MRN: [id number] Date of Birth: 08-23-1951

## 2018-08-12 ENCOUNTER — Ambulatory Visit
Admission: RE | Admit: 2018-08-12 | Discharge: 2018-08-12 | Disposition: A | Discharge status: Home / Self Care | Payer: Medicare Other | Source: Ambulatory Visit | Attending: Obstetrics & Gynecology | Admitting: Obstetrics & Gynecology

## 2018-08-12 DIAGNOSIS — Z1231 Encounter for screening mammogram for malignant neoplasm of breast: Secondary | ICD-10-CM

## 2018-08-17 ENCOUNTER — Ambulatory Visit: Payer: Medicare Other | Admitting: Physical Therapy

## 2018-08-17 ENCOUNTER — Encounter: Payer: Self-pay | Admitting: Physical Therapy

## 2018-08-17 DIAGNOSIS — M25662 Stiffness of left knee, not elsewhere classified: Secondary | ICD-10-CM

## 2018-08-17 DIAGNOSIS — M25562 Pain in left knee: Secondary | ICD-10-CM

## 2018-08-17 DIAGNOSIS — R262 Difficulty in walking, not elsewhere classified: Secondary | ICD-10-CM

## 2018-08-17 NOTE — Therapy (Signed)
[place], [place], [address] Phone: [phone]   Fax:  [phone]  Physical Therapy Treatment  Patient Details  Name: Lisa Wheeler MRN: [id number] Date of Birth: 06/28/1951 Referring Provider (PT): Lucey   Encounter Date: 08/17/2018  PT End of Session - 08/17/18 1439    Visit Number  19    Date for PT Re-Evaluation  09/08/18    PT Start Time  1354    PT Stop Time  1439    PT Time Calculation (min)  45 min    Activity Tolerance  Patient tolerated treatment well    Behavior During Therapy  [facility] for tasks assessed/performed       Past Medical History:  Diagnosis Date  . Arthritis   . Elevated lipids   . Hypertension   . Osteoarthritis of right knee    Severe  . Osteopenia    right hip    Past Surgical History:  Procedure Laterality Date  . CLOSED REDUCTION HAND FRACTURE    . OOPHORECTOMY     right  . TOTAL KNEE ARTHROPLASTY Right 02/23/2018   Procedure: TOTAL KNEE ARTHROPLASTY;  Surgeon: Vickey Huger, MD;  Location: [place];  Service: Orthopedics;  Laterality: Right;  . TOTAL KNEE ARTHROPLASTY Left 06/01/2018   Procedure: LEFT TOTAL KNEE ARTHROPLASTY;  Surgeon: Vickey Huger, MD;  [facility];  Service: Orthopedics;  Laterality: Left;  Adductor Block  . TUBAL LIGATION  1985  . WRIST SURGERY  2/12    There were no vitals filed for this visit.  Subjective Assessment - 08/17/18 1352    Subjective  I reallyt think I am doing well.    Currently in Pain?  No/denies         [facility] PT Assessment - 08/17/18 0001      AROM   Left Knee Extension  5    Left Knee Flexion  120      PROM   Right Knee Extension  0    Right Knee Flexion  125    Left Knee Extension  0    Left Knee Flexion  125                   [facility] Adult PT Treatment/Exercise - 08/17/18 0001      Ambulation/Gait   Gait Comments  gait outside, brisk pace, hills, education on uneven surfaces       High Level Balance   High Level Balance Comments  Bosu standing, then arm motions, biceps 3#      Knee/Hip Exercises: Stretches   Other Knee/Hip Stretches  low load long duration extension stretch      Knee/Hip Exercises: Aerobic   Elliptical  R=5 I=12 x 6 minutes, 2 minutes were backward    Recumbent Bike  6 minutes Level 2 at position #7      Knee/Hip Exercises: Machines for Strengthening   Cybex Leg Press  20# and no weight worked on going lower able to go to position to the end      Knee/Hip Exercises: Standing   Other Standing Knee Exercises  resisted gait all directions, cable 40$ all directions    Other Standing Knee Exercises  education on slowly progressing at the gym      Manual Therapy   Manual Therapy  Passive ROM    Passive ROM  flexion and extension with her sitting and in prone  PT Short Term Goals - 06/18/18 1018      PT SHORT TERM GOAL #1   Title  indepdndent with initial HEP    Status  Achieved        PT Long Term Goals - 08/17/18 1441      PT LONG TERM GOAL #1   Title  understand RICE    Status  Achieved      PT LONG TERM GOAL #2   Title  walk without device all community distances    Status  Achieved      PT LONG TERM GOAL #3   Title  increase AROM to 5-115 degrees flexion    Status  Achieved      PT LONG TERM GOAL #4   Title  decrease pain 50%    Status  Achieved      PT LONG TERM GOAL #5   Title  sleep > 5 hours    Status  Achieved            Plan - 08/17/18 1439    Clinical Impression Statement  Patient doing great, has good knowledge of how to continue on her own, feels good about her ROM and gait.  Will D/C with goals met    PT Next Visit Plan  D/C goals met    Consulted and Agree with Plan of Care  Patient       Patient will benefit from skilled therapeutic intervention in order to improve the following deficits and impairments:  Abnormal gait, Decreased range of motion, Difficulty walking, Pain,  Decreased balance, Decreased mobility, Decreased strength, Increased edema  Visit Diagnosis: Acute pain of left knee  Difficulty in walking, not elsewhere classified  Stiffness of left knee, not elsewhere classified     Problem List Patient Active Problem List   Diagnosis Date Noted  . S/P total knee replacement 02/23/2018  . Essential hypertension 08/08/2015  . Osteoporosis 06/15/2014  PHYSICAL THERAPY DISCHARGE SUMMARY   Plan: Patient agrees to discharge.  Patient goals were met. Patient is being discharged due to meeting the stated rehab goals.  ?????       Sumner Boast., PT 08/17/2018, 2:42 PM  [place], [place], [address] Phone: [phone]   Fax:  [phone]  Name: Lisa Wheeler MRN: [id number] Date of Birth: 1951/06/01

## 2018-08-18 ENCOUNTER — Encounter: Payer: Self-pay | Admitting: Obstetrics & Gynecology

## 2018-08-18 ENCOUNTER — Other Ambulatory Visit (HOSPITAL_COMMUNITY)
Admission: RE | Admit: 2018-08-18 | Discharge: 2018-08-18 | Disposition: A | Discharge status: Home / Self Care | Payer: Medicare Other | Source: Ambulatory Visit | Attending: Obstetrics & Gynecology | Admitting: Obstetrics & Gynecology

## 2018-08-18 ENCOUNTER — Other Ambulatory Visit: Payer: Self-pay

## 2018-08-18 ENCOUNTER — Ambulatory Visit: Payer: Medicare Other | Admitting: Obstetrics & Gynecology

## 2018-08-18 VITALS — BP 138/62 | HR 80 | Resp 16 | Ht 67.0 in | Wt 172.8 lb

## 2018-08-18 DIAGNOSIS — Z1211 Encounter for screening for malignant neoplasm of colon: Secondary | ICD-10-CM

## 2018-08-18 DIAGNOSIS — Z124 Encounter for screening for malignant neoplasm of cervix: Secondary | ICD-10-CM | POA: Insufficient documentation

## 2018-08-18 NOTE — Progress Notes (Signed)
67 y.o. G2P2 Married White or Caucasian female here for annual exam.  Had bilateral knee replacements this year--one in July and one in September.  Has been released from PT--just yesterday.    Denies vaginal bleeding.    PCP:  Dr. Valentina LucksGriffin.  Blood work was two weeks ago and this was good.    Patient's last menstrual period was 09/02/1998.          Sexually active: Yes.    The current method of family planning is post menopausal status.    Exercising: Yes.    walking Smoker:  no  Health Maintenance: Pap:  08/12/16 Neg   06/15/14 neg  History of abnormal Pap:  no MMG:  08/12/18 BIRADS1:neg  Colonoscopy:  1999 Normal.  Declines colonoscopy and cologuard.  IFOB given today.   BMD:   05/18/14 osteoporosis  TDaP:  2018 Pneumonia vaccine(s):  2015 Shingrix: had 1 2019 Hep C testing: done  Screening Labs: PCP   reports that she has never smoked. She has never used smokeless tobacco. She reports that she does not drink alcohol or use drugs.  Past Medical History:  Diagnosis Date  . Arthritis   . Elevated lipids   . Hypertension   . Osteoarthritis of right knee    Severe  . Osteopenia    right hip    Past Surgical History:  Procedure Laterality Date  . CLOSED REDUCTION HAND FRACTURE    . OOPHORECTOMY     right  . TOTAL KNEE ARTHROPLASTY Right 02/23/2018   Procedure: TOTAL KNEE ARTHROPLASTY;  Surgeon: Dannielle HuhLucey, Steve, MD;  [facility];  Service: Orthopedics;  Laterality: Right;  . TOTAL KNEE ARTHROPLASTY Left 06/01/2018   Procedure: LEFT TOTAL KNEE ARTHROPLASTY;  Surgeon: Dannielle HuhLucey, Steve, MD;  [facility];  Service: Orthopedics;  Laterality: Left;  Adductor Block  . TUBAL LIGATION  1985  . WRIST SURGERY  2/12    Current Outpatient Medications  Medication Sig Dispense Refill  . Ascorbic Acid (VITAMIN C) 500 MG CAPS Take by mouth daily.    . AZOR 10-40 MG per tablet Take 1 tablet by mouth daily.     Marland Kitchen. BIOTIN PO Take 2,000 mcg by mouth daily.    . calcium-vitamin D (OSCAL  WITH D) 500-200 MG-UNIT tablet Take 1 tablet by mouth daily.    . cholecalciferol (VITAMIN D) 1000 units tablet Take 1,000 Units by mouth daily.     No current facility-administered medications for this visit.     Family History  Problem Relation Age of Onset  . Breast cancer Mother 5779  . Stomach cancer Father   . Heart disease Father     Review of Systems  All other systems reviewed and are negative.   Exam:   BP 138/62 (BP Location: Right Arm, Patient Position: Sitting, Cuff Size: Large)   Pulse 80   Resp 16   Ht 5\' 7"  (1.702 m)   Wt 172 lb 12.8 oz (78.4 kg)   LMP 09/02/1998   BMI 27.06 kg/m   Height:   Height: 5\' 7"  (170.2 cm)  Ht Readings from Last 3 Encounters:  08/18/18 5\' 7"  (1.702 m)  06/01/18 5' 7.5" (1.715 m)  05/28/18 5' 7.5" (1.715 m)    General appearance: alert, cooperative and appears stated age Head: Normocephalic, without obvious abnormality, atraumatic Neck: no adenopathy, supple, symmetrical, trachea midline and thyroid normal to inspection and palpation Lungs: clear to auscultation bilaterally Breasts: normal appearance, no masses or tenderness Heart: regular rate and  rhythm Abdomen: soft, non-tender; bowel sounds normal; no masses,  no organomegaly Extremities: extremities normal, atraumatic, no cyanosis or edema Skin: Skin color, texture, turgor normal. No rashes or lesions Lymph nodes: Cervical, supraclavicular, and axillary nodes normal. No abnormal inguinal nodes palpated Neurologic: Grossly normal   Pelvic: External genitalia:  no lesions              Urethra:  normal appearing urethra with no masses, tenderness or lesions              Bartholins and Skenes: normal                 Vagina: normal appearing vagina with normal color and discharge, no lesions              Cervix: no lesions              Pap taken: Yes.   Bimanual Exam:  Uterus:  normal size, contour, position, consistency, mobility, non-tender              Adnexa: normal  adnexa and no mass, fullness, tenderness               Rectovaginal: Confirms               Anus:  normal sphincter tone, no lesions  Chaperone was present for exam.  A:  Well Woman with normal exam PMP, no HERT Osteoporosis in right hip, declines treatment or follow up at this time Hypertension Family hx of breast cancer in her mother (diagnosed age 38)  P:   Mammogram guidelines reviewed.  Desires doing yearly.   pap smear obtained today.  Pt desires to continue. Labs done with PCP IFOB given to pt Declines BMD Vaccines UTD--Still has to get the second injection return annually or prn

## 2018-08-19 LAB — CYTOLOGY - PAP: DIAGNOSIS: NEGATIVE

## 2018-08-30 LAB — FECAL OCCULT BLOOD, IMMUNOCHEMICAL: Fecal Occult Bld: NEGATIVE

## 2019-04-12 ENCOUNTER — Encounter: Payer: Self-pay | Admitting: Physical Therapy

## 2019-04-12 ENCOUNTER — Ambulatory Visit: Payer: Medicare Other | Attending: Physician Assistant | Admitting: Physical Therapy

## 2019-04-12 ENCOUNTER — Other Ambulatory Visit: Payer: Self-pay

## 2019-04-12 DIAGNOSIS — M25561 Pain in right knee: Secondary | ICD-10-CM | POA: Diagnosis not present

## 2019-04-12 DIAGNOSIS — R600 Localized edema: Secondary | ICD-10-CM | POA: Diagnosis present

## 2019-04-12 DIAGNOSIS — M25661 Stiffness of right knee, not elsewhere classified: Secondary | ICD-10-CM | POA: Insufficient documentation

## 2019-04-12 DIAGNOSIS — R262 Difficulty in walking, not elsewhere classified: Secondary | ICD-10-CM | POA: Insufficient documentation

## 2019-04-12 NOTE — Therapy (Signed)
[facility] [address] [place], [place], [address] Phone: [phone]   Fax:  [phone]  Physical Therapy Evaluation  Patient Details  Name: Lisa Wheeler MRN: [id number] Date of Birth: [birth year] Referring Provider (PT): Lucey   Encounter Date: 04/12/2019  PT End of Session - 04/12/19 1048    Visit Number  1    Date for PT Re-Evaluation  06/12/19    PT Start Time  1000    PT Stop Time  1050    PT Time Calculation (min)  50 min    Activity Tolerance  Patient tolerated treatment well    Behavior During Therapy  [facility] for tasks assessed/performed       Past Medical History:  Diagnosis Date  . Arthritis   . Elevated lipids   . Hypertension   . Osteoarthritis of right knee    Severe  . Osteopenia    right hip    Past Surgical History:  Procedure Laterality Date  . CLOSED REDUCTION HAND FRACTURE    . OOPHORECTOMY     right  . TOTAL KNEE ARTHROPLASTY Right 02/23/2018   Procedure: TOTAL KNEE ARTHROPLASTY;  Surgeon: Dannielle HuhLucey, Steve, MD;  [facility];  Service: Orthopedics;  Laterality: Right;  . TOTAL KNEE ARTHROPLASTY Left 06/01/2018   Procedure: LEFT TOTAL KNEE ARTHROPLASTY;  Surgeon: Dannielle HuhLucey, Steve, MD;  [facility];  Service: Orthopedics;  Laterality: Left;  Adductor Block  . TUBAL LIGATION  1985  . WRIST SURGERY  2/12    There were no vitals filed for this visit.   Subjective Assessment - 04/12/19 1003    Subjective  Patient had a right TKR in June 2019.  She did great with this.  She reports that since April 2020 she has had issues with right knee "locking", pain and stiffness.  She reports that she really had to stop exercise and returned to using a walker due to difficulty bending and pain.  Recent x-rays were negative.  She reports that she has lost ROM and is having pain    Limitations  Lifting;Standing;Walking;House hold activities    Patient Stated Goals  have less pain, walk better, have  better ROM, no locking of the knee    Currently in Pain?  Yes    Pain Score  0-No pain    Pain Location  Knee    Pain Orientation  Right    Pain Descriptors / Indicators  Tightness    Pain Type  Acute pain    Pain Radiating Towards  at times pain down the shin    Pain Onset  More than a month ago    Pain Frequency  Intermittent    Aggravating Factors   unsure of what causes the instance of pain but when she weight bears the pain will be 10/10, worse with bending    Pain Relieving Factors  using walker,  rest, not bending the knee, pain can be 0/10    Effect of Pain on Daily Activities  when it hurts limits all ADL's, has to use the walker         [facility] PT Assessment - 04/12/19 0001      Assessment   Medical Diagnosis  right knee pain    Referring Provider (PT)  Lucey    Onset Date/Surgical Date  03/12/19    Prior Therapy  last year for the right TKR      Precautions   Precautions  None  Balance Screen   Has the patient fallen in the past 6 months  No    Has the patient had a decrease in activity level because of a fear of falling?   No    Is the patient reluctant to leave their home because of a fear of falling?   No      Home Environment   Additional Comments  has stairs, housework      Prior Function   Level of Independence  Independent    Vocation  Retired    Leisure  likes to walk and use her bike      ROM / Strength   AROM / PROM / Strength  AROM;PROM;Strength      AROM   AROM Assessment Site  Knee    Right/Left Knee  Right    Right Knee Extension  23    Right Knee Flexion  90      PROM   PROM Assessment Site  Knee    Right/Left Knee  Right    Right Knee Extension  15    Right Knee Flexion  100      Strength   Overall Strength Comments  right knee extension 4/5 , flexion 4-/5 with pain in the posterior lateral      Palpation   Palpation comment  mild warmth.has tenderness in the right lateral patellar and the superior patellar area                 Objective measurements completed on examination: See above findings.      [facility] Adult PT Treatment/Exercise - 04/12/19 0001      Modalities   Modalities  Iontophoresis      Iontophoresis   Type of Iontophoresis  Dexamethasone    Location  right lateral knee    Dose  80mA    Time  4 hour patch             PT Education - 04/12/19 1047    Education Details  low load long duration stretch for flexion and extension    Person(s) Educated  Patient    Methods  Explanation;Demonstration    Comprehension  Verbalized understanding       PT Short Term Goals - 04/12/19 1053      PT SHORT TERM GOAL #1   Title  indepdndent with initial HEP    Time  2    Period  Weeks    Status  New        PT Long Term Goals - 04/12/19 1053      PT LONG TERM GOAL #1   Title  understand RICE    Time  8    Period  Weeks      PT LONG TERM GOAL #2   Title  walk with a better gait, demonstrating a natural knee bend    Time  8    Period  Weeks    Status  New      PT LONG TERM GOAL #3   Title  increase AROM to 5-115 degrees flexion    Time  8    Period  Weeks    Status  New      PT LONG TERM GOAL #4   Title  decrease pain 50%    Time  8    Period  Weeks    Status  New      PT LONG TERM GOAL #5   Title  no episodes of knee "locking"  over a 4 week period    Time  [place] - 04/12/19 1049    Clinical Impression Statement  Patient had a right TKR in June of 2019, she did great and got great ROM and no pain.  She reports that in April of this year she started having pain in the right knee and reported that she has "locking" at times, x-rays are negative, MD felt like it was soft tissue related.  She has lost over 50 degrees of motion since we ended PT.  AROM 23-90 degreees flexion.  She is tight and tender in the superior and latereal knee area.  She is walking stiff legged.    Stability/Clinical Decision Making   Evolving/Moderate complexity    Clinical Decision Making  Low    Rehab Potential  Good    PT Frequency  2x / week    PT Duration  8 weeks    PT Treatment/Interventions  ADLs/Self Care Home Management;Cryotherapy;Electrical Stimulation;Iontophoresis 4mg /ml Dexamethasone;Ultrasound;Therapeutic activities;Functional mobility training;Stair training;Gait training;Therapeutic exercise;Balance training;Neuromuscular re-education;Patient/family education;Manual techniques;Dry needling    PT Next Visit Plan  work on ROM and try to help pain    Consulted and Agree with Plan of Care  Patient       Patient will benefit from skilled therapeutic intervention in order to improve the following deficits and impairments:  Abnormal gait, Decreased mobility, Pain, Increased muscle spasms, Decreased strength, Decreased range of motion, Decreased endurance, Decreased activity tolerance, Decreased balance, Difficulty walking, Impaired flexibility  Visit Diagnosis: 1. Acute pain of right knee   2. Stiffness of right knee, not elsewhere classified   3. Difficulty in walking, not elsewhere classified   4. Localized edema        Problem List Patient Active Problem List   Diagnosis Date Noted  . S/P total knee replacement 02/23/2018  . Essential hypertension 08/08/2015  . Osteoporosis 06/15/2014    Sumner Boast., PT 04/12/2019, 11:05 AM  [place] [address] [place], [place], [address] Phone: [phone]   Fax:  [phone]  Name: Lisa Wheeler MRN: [id number] Date of Birth: Dec 12, 1950

## 2019-04-19 ENCOUNTER — Other Ambulatory Visit: Payer: Self-pay

## 2019-04-19 ENCOUNTER — Encounter: Payer: Self-pay | Admitting: Physical Therapy

## 2019-04-19 ENCOUNTER — Ambulatory Visit: Payer: Medicare Other | Admitting: Physical Therapy

## 2019-04-19 DIAGNOSIS — M25561 Pain in right knee: Secondary | ICD-10-CM | POA: Diagnosis not present

## 2019-04-19 DIAGNOSIS — M25661 Stiffness of right knee, not elsewhere classified: Secondary | ICD-10-CM

## 2019-04-19 DIAGNOSIS — R600 Localized edema: Secondary | ICD-10-CM

## 2019-04-19 DIAGNOSIS — R262 Difficulty in walking, not elsewhere classified: Secondary | ICD-10-CM

## 2019-04-19 NOTE — Therapy (Signed)
[place], [place], [address] Phone: [phone]   Fax:  [phone]  Physical Therapy Treatment  Patient Details  Name: Lisa Wheeler MRN: [id number] Date of Birth: [birth year] Referring Provider (PT): Lucey   Encounter Date: 04/19/2019  PT End of Session - 04/19/19 1734    Visit Number  2    Date for PT Re-Evaluation  06/12/19    PT Start Time  1647    PT Stop Time  1745    PT Time Calculation (min)  58 min    Activity Tolerance  Patient tolerated treatment well    Behavior During Therapy  [facility] for tasks assessed/performed       Past Medical History:  Diagnosis Date  . Arthritis   . Elevated lipids   . Hypertension   . Osteoarthritis of right knee    Severe  . Osteopenia    right hip    Past Surgical History:  Procedure Laterality Date  . CLOSED REDUCTION HAND FRACTURE    . OOPHORECTOMY     right  . TOTAL KNEE ARTHROPLASTY Right 02/23/2018   Procedure: TOTAL KNEE ARTHROPLASTY;  Surgeon: Vickey Huger, MD;  Location: [facility];  Service: Orthopedics;  Laterality: Right;  . TOTAL KNEE ARTHROPLASTY Left 06/01/2018   Procedure: LEFT TOTAL KNEE ARTHROPLASTY;  Surgeon: Vickey Huger, MD;  [facility];  Service: Orthopedics;  Laterality: Left;  Adductor Block  . TUBAL LIGATION  1985  . WRIST SURGERY  2/12    There were no vitals filed for this visit.  Subjective Assessment - 04/19/19 1648    Subjective  Patient comes in with a log of her writing on what happened after the last visit, she reports that she had a great 30 hours with less pain, reports that after that she has had increased episodes of "locking up and swelling", reports that she has had times where she could not bear weight and was using a walker    Currently in Pain?  Yes    Pain Score  4     Pain Location  Knee    Pain Orientation  Right    Pain Descriptors / Indicators  Aching    Aggravating Factors   unsure                        [facility] Adult PT Treatment/Exercise - 04/19/19 0001      Exercises   Exercises  Knee/Hip      Knee/Hip Exercises: Aerobic   Nustep  level 4 x 5 minutes      Modalities   Modalities  Iontophoresis;Ultrasound;Electrical Stimulation;Moist Heat      Moist Heat Therapy   Number Minutes Moist Heat  12 Minutes    Moist Heat Location  Hip      Electrical Stimulation   Electrical Stimulation Location  right anterior hip    Electrical Stimulation Action  IFC    Electrical Stimulation Parameters  supine    Electrical Stimulation Goals  Pain      Ultrasound   Ultrasound Location  right quad area and the lateral right knee    Ultrasound Parameters  100% 1.5w/cm2    Ultrasound Goals  Pain      Iontophoresis   Type of Iontophoresis  Dexamethasone    Location  right lateral knee    Dose  43mA    Time  4 hour patch  Manual Therapy   Manual Therapy  Joint mobilization;Soft tissue mobilization    Joint Mobilization  some gentle joint mobs with some gentle passive stretch    Soft tissue mobilization  to the quad , ITB area               PT Short Term Goals - 04/12/19 1053      PT SHORT TERM GOAL #1   Title  indepdndent with initial HEP    Time  2    Period  Weeks    Status  New        PT Long Term Goals - 04/12/19 1053      PT LONG TERM GOAL #1   Title  understand RICE    Time  8    Period  Weeks      PT LONG TERM GOAL #2   Title  walk with a better gait, demonstrating a natural knee bend    Time  8    Period  Weeks    Status  New      PT LONG TERM GOAL #3   Title  increase AROM to 5-115 degrees flexion    Time  8    Period  Weeks    Status  New      PT LONG TERM GOAL #4   Title  decrease pain 50%    Time  8    Period  Weeks    Status  New      PT LONG TERM GOAL #5   Title  no episodes of knee "locking" over a 4 week period    Time  8    Period  Weeks    Status  New            Plan - 04/19/19 1735     Clinical Impression Statement  Patient reports that she has the leg "lock up" I can see that she has a very tight hip flexor and quad, she has a hard time relaxing, I asked about the hip and she said no issues.  She is tender in the ITB near the knee    PT Next Visit Plan  work on ROM and try to help pain    Consulted and Agree with Plan of Care  Patient       Patient will benefit from skilled therapeutic intervention in order to improve the following deficits and impairments:  Abnormal gait, Decreased mobility, Pain, Increased muscle spasms, Decreased strength, Decreased range of motion, Decreased endurance, Decreased activity tolerance, Decreased balance, Difficulty walking, Impaired flexibility  Visit Diagnosis: 1. Acute pain of right knee   2. Stiffness of right knee, not elsewhere classified   3. Difficulty in walking, not elsewhere classified   4. Localized edema        Problem List Patient Active Problem List   Diagnosis Date Noted  . S/P total knee replacement 02/23/2018  . Essential hypertension 08/08/2015  . Osteoporosis 06/15/2014    Jearld LeschALBRIGHT,Escher Harr W., PT 04/19/2019, 5:38 PM  [facility] [address] [place], [place], [address] Phone: [phone]   Fax:  [phone]  Name: Lisa Wheeler MRN: [id number] Date of Birth: 1950-10-31

## 2019-04-22 ENCOUNTER — Other Ambulatory Visit: Payer: Self-pay

## 2019-04-22 ENCOUNTER — Encounter: Payer: Self-pay | Admitting: Physical Therapy

## 2019-04-22 ENCOUNTER — Ambulatory Visit: Payer: Medicare Other | Admitting: Physical Therapy

## 2019-04-22 DIAGNOSIS — R262 Difficulty in walking, not elsewhere classified: Secondary | ICD-10-CM

## 2019-04-22 DIAGNOSIS — M25561 Pain in right knee: Secondary | ICD-10-CM

## 2019-04-22 DIAGNOSIS — R600 Localized edema: Secondary | ICD-10-CM

## 2019-04-22 DIAGNOSIS — M25661 Stiffness of right knee, not elsewhere classified: Secondary | ICD-10-CM

## 2019-04-22 NOTE — Therapy (Signed)
[facility] [address] [place], [place], [address] Phone: [phone]   Fax:  [phone]  Physical Therapy Treatment  Patient Details  Name: Lisa Wheeler MRN: [id number] Date of Birth: Apr 15, 1951 Referring Provider (PT): Lucey   Encounter Date: 04/22/2019  PT End of Session - 04/22/19 1439    Visit Number  3    Date for PT Re-Evaluation  06/12/19    PT Start Time  1353    PT Stop Time  1450    PT Time Calculation (min)  57 min    Activity Tolerance  Patient tolerated treatment well    Behavior During Therapy  [facility] for tasks assessed/performed       Past Medical History:  Diagnosis Date  . Arthritis   . Elevated lipids   . Hypertension   . Osteoarthritis of right knee    Severe  . Osteopenia    right hip    Past Surgical History:  Procedure Laterality Date  . CLOSED REDUCTION HAND FRACTURE    . OOPHORECTOMY     right  . TOTAL KNEE ARTHROPLASTY Right 02/23/2018   Procedure: TOTAL KNEE ARTHROPLASTY;  Surgeon: Dannielle HuhLucey, Steve, MD;  [facility];  Service: Orthopedics;  Laterality: Right;  . TOTAL KNEE ARTHROPLASTY Left 06/01/2018   Procedure: LEFT TOTAL KNEE ARTHROPLASTY;  Surgeon: Dannielle HuhLucey, Steve, MD;  [facility];  Service: Orthopedics;  Laterality: Left;  Adductor Block  . TUBAL LIGATION  1985  . WRIST SURGERY  2/12    There were no vitals filed for this visit.  Subjective Assessment - 04/22/19 1358    Subjective  Reports that she had about 36 hours of relief from the last treatment and then a lock up at 4AM while in bed    Currently in Pain?  Yes    Pain Score  4     Pain Location  Knee    Pain Orientation  Right                       [facility] Adult PT Treatment/Exercise - 04/22/19 0001      Knee/Hip Exercises: Aerobic   Nustep  level 4 x 6 minutes      Knee/Hip Exercises: Machines for Strengthening   Total Gym Leg Press  no weight just working on the ROM      Knee/Hip  Exercises: Supine   Short Arc Quad Sets  Right;3 sets;10 reps    Other Supine Knee/Hip Exercises  feet on ball K2C, bridges      Electrical Stimulation   Electrical Stimulation Location  right anterior hip and into the right lateral knee    Electrical Stimulation Action  IFC    Electrical Stimulation Parameters  supine    Electrical Stimulation Goals  Pain      Ultrasound   Ultrasound Location  right quad area    Ultrasound Parameters  100% 1MHz1.4 w/cm2    Ultrasound Goals  Pain      Iontophoresis   Type of Iontophoresis  Dexamethasone    Location  right lateral knee    Dose  80mA    Time  4 hour patch      Manual Therapy   Manual Therapy  Joint mobilization;Soft tissue mobilization    Joint Mobilization  some gentle joint mobs with some gentle passive stretch    Soft tissue mobilization  to the quad , ITB area  PT Short Term Goals - 04/22/19 1441      PT SHORT TERM GOAL #1   Title  indepdndent with initial HEP    Status  Achieved        PT Long Term Goals - 04/12/19 1053      PT LONG TERM GOAL #1   Title  understand RICE    Time  8    Period  Weeks      PT LONG TERM GOAL #2   Title  walk with a better gait, demonstrating a natural knee bend    Time  8    Period  Weeks    Status  New      PT LONG TERM GOAL #3   Title  increase AROM to 5-115 degrees flexion    Time  8    Period  Weeks    Status  New      PT LONG TERM GOAL #4   Title  decrease pain 50%    Time  8    Period  Weeks    Status  New      PT LONG TERM GOAL #5   Title  no episodes of knee "locking" over a 4 week period    Time  8    Period  Weeks    Status  New            Plan - 04/22/19 1440    Clinical Impression Statement  Patietn had good relief from the last visit, she is very tight in the mms of the right LE.  Very tender in the right ITB.  Tolerated exercises well without increase of pain    PT Next Visit Plan  work on ROM and try to help pain     Consulted and Agree with Plan of Care  Patient       Patient will benefit from skilled therapeutic intervention in order to improve the following deficits and impairments:  Abnormal gait, Decreased mobility, Pain, Increased muscle spasms, Decreased strength, Decreased range of motion, Decreased endurance, Decreased activity tolerance, Decreased balance, Difficulty walking, Impaired flexibility  Visit Diagnosis: Acute pain of right knee  Stiffness of right knee, not elsewhere classified  Difficulty in walking, not elsewhere classified  Localized edema     Problem List Patient Active Problem List   Diagnosis Date Noted  . S/P total knee replacement 02/23/2018  . Essential hypertension 08/08/2015  . Osteoporosis 06/15/2014    Sumner Boast., PT 04/22/2019, 2:42 PM  [place], [place], [address] Phone: [phone]   Fax:  [phone]  Name: Lisa Wheeler MRN: [id number] Date of Birth: 03/04/1951

## 2019-04-26 ENCOUNTER — Encounter: Payer: Self-pay | Admitting: Physical Therapy

## 2019-04-26 ENCOUNTER — Ambulatory Visit: Payer: Medicare Other | Admitting: Physical Therapy

## 2019-04-26 ENCOUNTER — Other Ambulatory Visit: Payer: Self-pay

## 2019-04-26 DIAGNOSIS — R600 Localized edema: Secondary | ICD-10-CM

## 2019-04-26 DIAGNOSIS — M25561 Pain in right knee: Secondary | ICD-10-CM

## 2019-04-26 DIAGNOSIS — R262 Difficulty in walking, not elsewhere classified: Secondary | ICD-10-CM

## 2019-04-26 DIAGNOSIS — M25661 Stiffness of right knee, not elsewhere classified: Secondary | ICD-10-CM

## 2019-04-26 NOTE — Therapy (Signed)
[facility] [address] [place], [place], [address] Phone: [phone]   Fax:  [phone]  Physical Therapy Treatment  Patient Details  Name: Lisa GauzeDeborah L Hemberger MRN: [id number] Date of Birth: 09-May-1951 Referring Provider (PT): Lucey   Encounter Date: 04/26/2019  PT End of Session - 04/26/19 1348    Visit Number  4    Date for PT Re-Evaluation  06/12/19    PT Start Time  1312    PT Stop Time  1410    PT Time Calculation (min)  58 min    Activity Tolerance  Patient tolerated treatment well    Behavior During Therapy  [facility] for tasks assessed/performed       Past Medical History:  Diagnosis Date  . Arthritis   . Elevated lipids   . Hypertension   . Osteoarthritis of right knee    Severe  . Osteopenia    right hip    Past Surgical History:  Procedure Laterality Date  . CLOSED REDUCTION HAND FRACTURE    . OOPHORECTOMY     right  . TOTAL KNEE ARTHROPLASTY Right 02/23/2018   Procedure: TOTAL KNEE ARTHROPLASTY;  Surgeon: Dannielle HuhLucey, Steve, MD;  [facility];  Service: Orthopedics;  Laterality: Right;  . TOTAL KNEE ARTHROPLASTY Left 06/01/2018   Procedure: LEFT TOTAL KNEE ARTHROPLASTY;  Surgeon: Dannielle HuhLucey, Steve, MD;  [facility];  Service: Orthopedics;  Laterality: Left;  Adductor Block  . TUBAL LIGATION  1985  . WRIST SURGERY  2/12    There were no vitals filed for this visit.  Subjective Assessment - 04/26/19 1314    Subjective  Patient reprots that she has done okay, she had one episode of the knee being "locked" on early Friday morning.  She is very stiff today when she walks    Currently in Pain?  Yes    Pain Score  3     Pain Location  Knee    Pain Orientation  Right;Lateral    Aggravating Factors   bending                       [facility] Adult PT Treatment/Exercise - 04/26/19 0001      Ambulation/Gait   Gait Comments  worked on bending knee at toe off      Knee/Hip Exercises: Aerobic    Nustep  level 4 x 6 minutes      Knee/Hip Exercises: Machines for Strengthening   Total Gym Leg Press  no weight just working on the ROM      Knee/Hip Exercises: Standing   Walking with Sports Cord  forward and backward focus on extension      Knee/Hip Exercises: Supine   Short Arc Quad Sets  Right;3 sets;10 reps    Short Arc Quad Sets Limitations  3#    Other Supine Knee/Hip Exercises  feet on ball K2C, bridges      Programme researcher, broadcasting/film/videolectrical Stimulation   Electrical Stimulation Location  right knee area    Statisticianlectrical Stimulation Action  IFC    Electrical Stimulation Parameters  supine    Electrical Stimulation Goals  Pain      Iontophoresis   Type of Iontophoresis  Dexamethasone    Location  right lateral knee    Dose  80mA    Time  4 hour patch      Manual Therapy   Manual Therapy  Joint mobilization;Soft tissue mobilization;Passive ROM    Joint Mobilization  some gentle joint mobs with some gentle passive stretch    Soft tissue mobilization  to the quad , ITB area    Passive ROM  flexion and extension               PT Short Term Goals - 04/22/19 1441      PT SHORT TERM GOAL #1   Title  indepdndent with initial HEP    Status  Achieved        PT Long Term Goals - 04/12/19 1053      PT LONG TERM GOAL #1   Title  understand RICE    Time  8    Period  Weeks      PT LONG TERM GOAL #2   Title  walk with a better gait, demonstrating a natural knee bend    Time  8    Period  Weeks    Status  New      PT LONG TERM GOAL #3   Title  increase AROM to 5-115 degrees flexion    Time  8    Period  Weeks    Status  New      PT LONG TERM GOAL #4   Title  decrease pain 50%    Time  8    Period  Weeks    Status  New      PT LONG TERM GOAL #5   Title  no episodes of knee "locking" over a 4 week period    Time  8    Period  Weeks    Status  New            Plan - 04/26/19 1348    Clinical Impression Statement  Patient very stiff today, practiced walking with  her and having her bend the knee and have a good heel strike.  She was less tender today, still very tight and does not like the PROM    PT Next Visit Plan  work on ROM and try to help pain    Consulted and Agree with Plan of Care  Patient       Patient will benefit from skilled therapeutic intervention in order to improve the following deficits and impairments:  Abnormal gait, Decreased mobility, Pain, Increased muscle spasms, Decreased strength, Decreased range of motion, Decreased endurance, Decreased activity tolerance, Decreased balance, Difficulty walking, Impaired flexibility  Visit Diagnosis: Acute pain of right knee  Stiffness of right knee, not elsewhere classified  Difficulty in walking, not elsewhere classified  Localized edema     Problem List Patient Active Problem List   Diagnosis Date Noted  . S/P total knee replacement 02/23/2018  . Essential hypertension 08/08/2015  . Osteoporosis 06/15/2014    Lisa Boast., PT 04/26/2019, 1:51 PM  [place], [place], [address] Phone: [phone]   Fax:  [phone]  Name: Lisa Wheeler MRN: [id number] Date of Birth: 03/20/1951

## 2019-04-29 ENCOUNTER — Encounter: Payer: Self-pay | Admitting: Physical Therapy

## 2019-04-29 ENCOUNTER — Ambulatory Visit: Payer: Medicare Other | Admitting: Physical Therapy

## 2019-04-29 ENCOUNTER — Other Ambulatory Visit: Payer: Self-pay

## 2019-04-29 DIAGNOSIS — R262 Difficulty in walking, not elsewhere classified: Secondary | ICD-10-CM

## 2019-04-29 DIAGNOSIS — M25561 Pain in right knee: Secondary | ICD-10-CM | POA: Diagnosis not present

## 2019-04-29 DIAGNOSIS — M25661 Stiffness of right knee, not elsewhere classified: Secondary | ICD-10-CM

## 2019-04-29 DIAGNOSIS — R600 Localized edema: Secondary | ICD-10-CM

## 2019-04-29 NOTE — Therapy (Signed)
[facility] [address] [place], [place], [address] Phone: [phone]   Fax:  [phone]  Physical Therapy Treatment  Patient Details  Name: Lisa GauzeDeborah L Wheeler MRN: [id number] Date of Birth: 10/23/1950 Referring Provider (PT): Lucey   Encounter Date: 04/29/2019  PT End of Session - 04/29/19 1127    Visit Number  5    Date for PT Re-Evaluation  06/12/19    PT Start Time  1054    PT Stop Time  1150    PT Time Calculation (min)  56 min    Activity Tolerance  Patient tolerated treatment well    Behavior During Therapy  [facility] for [facility] assessed/performed       Past Medical History:  Diagnosis Date  . Arthritis   . Elevated lipids   . Hypertension   . Osteoarthritis of right knee    Severe  . Osteopenia    right hip    Past Surgical History:  Procedure Laterality Date  . CLOSED REDUCTION HAND FRACTURE    . OOPHORECTOMY     right  . TOTAL KNEE ARTHROPLASTY Right 02/23/2018   Procedure: TOTAL KNEE ARTHROPLASTY;  Surgeon: Dannielle HuhLucey, Steve, MD;  [facility];  Service: Orthopedics;  Laterality: Right;  . TOTAL KNEE ARTHROPLASTY Left 06/01/2018   Procedure: LEFT TOTAL KNEE ARTHROPLASTY;  Surgeon: Dannielle HuhLucey, Steve, MD;  [facility];  Service: Orthopedics;  Laterality: Left;  Adductor Block  . TUBAL LIGATION  1985  . WRIST SURGERY  2/12    There were no vitals filed for this visit.  Subjective Assessment - 04/29/19 1053    Subjective  Reports that over the psat 3 days she has been doing a little better, still some stiffness and sore    Currently in Pain?  Yes    Pain Score  2     Pain Location  Knee    Pain Orientation  Right;Anterior;Lateral;Posterior         [facility] - 04/29/19 0001      AROM   Right Knee Flexion  93      PROM   Right Knee Extension  12    Right Knee Flexion  100                   [facility] Adult PT Treatment/Exercise - 04/29/19 0001      Knee/Hip Exercises:  Aerobic   Nustep  level 4 x 6 minutes      Knee/Hip Exercises: Machines for Strengthening   Total Gym Leg Press  no weight just working on the ROM, 20# both legs and then right leg in a smaller ROM      Knee/Hip Exercises: Supine   Short Arc Quad Sets  Right;3 sets;10 reps    Short Arc Quad Sets Limitations  3# cues to get TKE    Other Supine Knee/Hip Exercises  feet on ball K2C, bridges      Electrical Stimulation   Electrical Stimulation Location  right knee area    Electrical Stimulation Action  IFC    Electrical Stimulation Parameters  supine    Electrical Stimulation Goals  Pain      Iontophoresis   Type of Iontophoresis  Dexamethasone    Location  right lateral knee    Dose  80mA    Time  4 hour patch      Manual Therapy   Manual Therapy  Joint mobilization;Soft tissue mobilization;Passive ROM  Joint Mobilization  some gentle joint mobs with some gentle passive stretch    Soft tissue mobilization  to the quad , ITB area    Passive ROM  flexion and extension               PT Short Term Goals - 04/22/19 1441      PT SHORT TERM GOAL #1   Title  indepdndent with initial HEP    Status  Achieved        PT Long Term Goals - 04/29/19 1130      PT LONG TERM GOAL #1   Title  understand RICE    Status  Achieved      PT LONG TERM GOAL #2   Title  walk with a better gait, demonstrating a natural knee bend    Status  On-going      PT LONG TERM GOAL #3   Title  increase AROM to 5-115 degrees flexion    Status  On-going            Plan - 04/29/19 1128    Clinical Impression Statement  Patietn reports no "lock ups" the last 3 days since the last treatment, does report some pain in the medial knee today.  She is still very tight and needs cues to relax and allow stretch otherwise she is really fighting the motions and causing more issues.    PT Next Visit Plan  work on ROM and try to help pain    Consulted and Agree with Plan of Care  Patient        Patient will benefit from skilled therapeutic intervention in order to improve the following deficits and impairments:  Abnormal gait, Decreased mobility, Pain, Increased muscle spasms, Decreased strength, Decreased range of motion, Decreased endurance, Decreased activity tolerance, Decreased balance, Difficulty walking, Impaired flexibility  Visit Diagnosis: Acute pain of right knee  Stiffness of right knee, not elsewhere classified  Difficulty in walking, not elsewhere classified  Localized edema     Problem List Patient Active Problem List   Diagnosis Date Noted  . S/P total knee replacement 02/23/2018  . Essential hypertension 08/08/2015  . Osteoporosis 06/15/2014    Lisa Wheeler., PT 04/29/2019, 11:32 AM  [place] [address] [place], [place], [address] Phone: [phone]   Fax:  [phone]  Name: Lisa Wheeler MRN: [id number] Date of Birth: [birth year]

## 2019-05-03 ENCOUNTER — Ambulatory Visit: Payer: Medicare Other | Admitting: Physical Therapy

## 2019-05-03 ENCOUNTER — Other Ambulatory Visit: Payer: Self-pay

## 2019-05-03 ENCOUNTER — Encounter: Payer: Self-pay | Admitting: Physical Therapy

## 2019-05-03 DIAGNOSIS — M25561 Pain in right knee: Secondary | ICD-10-CM | POA: Diagnosis not present

## 2019-05-03 DIAGNOSIS — R262 Difficulty in walking, not elsewhere classified: Secondary | ICD-10-CM

## 2019-05-03 DIAGNOSIS — M25661 Stiffness of right knee, not elsewhere classified: Secondary | ICD-10-CM

## 2019-05-03 DIAGNOSIS — R600 Localized edema: Secondary | ICD-10-CM

## 2019-05-03 NOTE — Therapy (Signed)
[facility] [address] [place], [place], [address] Phone: [phone]   Fax:  [phone]  Physical Therapy Treatment  Patient Details  Name: Lisa Wheeler MRN: [id number] Date of Birth: [birth year] Referring Provider (PT): Lucey   Encounter Date: 05/03/2019  PT End of Session - 05/03/19 1050    Visit Number  6    Date for PT Re-Evaluation  06/12/19    PT Start Time  1010    PT Stop Time  1109    PT Time Calculation (min)  59 min    Activity Tolerance  Patient tolerated treatment well    Behavior During Therapy  [facility] for tasks assessed/performed       Past Medical History:  Diagnosis Date  . Arthritis   . Elevated lipids   . Hypertension   . Osteoarthritis of right knee    Severe  . Osteopenia    right hip    Past Surgical History:  Procedure Laterality Date  . CLOSED REDUCTION HAND FRACTURE    . OOPHORECTOMY     right  . TOTAL KNEE ARTHROPLASTY Right 02/23/2018   Procedure: TOTAL KNEE ARTHROPLASTY;  Surgeon: Dannielle HuhLucey, Steve, MD;  [facility];  Service: Orthopedics;  Laterality: Right;  . TOTAL KNEE ARTHROPLASTY Left 06/01/2018   Procedure: LEFT TOTAL KNEE ARTHROPLASTY;  Surgeon: Dannielle HuhLucey, Steve, MD;  [facility];  Service: Orthopedics;  Laterality: Left;  Adductor Block  . TUBAL LIGATION  1985  . WRIST SURGERY  2/12    There were no vitals filed for this visit.  Subjective Assessment - 05/03/19 1012    Subjective  Patient reports that she is doing okay, feels like theweather makes it stiff and sore    Currently in Pain?  Yes    Pain Score  3     Pain Location  Knee    Pain Orientation  Right    Pain Descriptors / Indicators  Sore;Tightness    Aggravating Factors   weather                       [facility] Adult PT Treatment/Exercise - 05/03/19 0001      Ambulation/Gait   Gait Comments  stairs step over step up and dwn      Knee/Hip Exercises: Aerobic   Elliptical  I=10, R=4 x  3 minutes    Nustep  level 5 x 6 minutes      Knee/Hip Exercises: Machines for Strengthening   Cybex Knee Extension  5# 2x10    Cybex Knee Flexion  20# 2x10    Total Gym Leg Press  no weight just working on the ROM, 20# both legs and then right leg in a smaller ROM      Knee/Hip Exercises: Standing   Other Standing Knee Exercises  lunge stretch      Moist Heat Therapy   Number Minutes Moist Heat  12 Minutes    Moist Heat Location  Hip      Electrical Stimulation   Electrical Stimulation Location  right knee area    Electrical Stimulation Action  IFC    Electrical Stimulation Parameters  supine    Electrical Stimulation Goals  Pain      Iontophoresis   Type of Iontophoresis  Dexamethasone    Location  right medial knee area    Dose  80mA    Time  4 hour patch      Manual Therapy  Manual Therapy  Joint mobilization;Soft tissue mobilization;Passive ROM    Joint Mobilization  some gentle joint mobs with some gentle passive stretch    Soft tissue mobilization  to the quad , ITB area    Passive ROM  flexion and extension               PT Short Term Goals - 04/22/19 1441      PT SHORT TERM GOAL #1   Title  indepdndent with initial HEP    Status  Achieved        PT Long Term Goals - 05/03/19 1052      PT LONG TERM GOAL #1   Title  understand RICE    Status  Achieved      PT LONG TERM GOAL #2   Title  walk with a better gait, demonstrating a natural knee bend    Status  On-going            Plan - 05/03/19 1051    Clinical Impression Statement  Patient reports that it has been 10 days since the last "locking" instance, reports that with the rainy weather she does get sore and stiff.  She is moving better, able to go up and down stairs step over step    PT Next Visit Plan  slowly add exercises    Consulted and Agree with Plan of Care  Patient       Patient will benefit from skilled therapeutic intervention in order to improve the following deficits  and impairments:  Abnormal gait, Decreased mobility, Pain, Increased muscle spasms, Decreased strength, Decreased range of motion, Decreased endurance, Decreased activity tolerance, Decreased balance, Difficulty walking, Impaired flexibility  Visit Diagnosis: Acute pain of right knee  Stiffness of right knee, not elsewhere classified  Difficulty in walking, not elsewhere classified  Localized edema     Problem List Patient Active Problem List   Diagnosis Date Noted  . S/P total knee replacement 02/23/2018  . Essential hypertension 08/08/2015  . Osteoporosis 06/15/2014    Sumner Boast., PT 05/03/2019, 10:53 AM  [place] [address] [place], [place], [address] Phone: [phone]   Fax:  [phone]  Name: Lisa Wheeler MRN: [id number] Date of Birth: 07/06/1951

## 2019-05-06 ENCOUNTER — Other Ambulatory Visit: Payer: Self-pay

## 2019-05-06 ENCOUNTER — Ambulatory Visit: Payer: Medicare Other | Attending: Physician Assistant | Admitting: Physical Therapy

## 2019-05-06 ENCOUNTER — Encounter: Payer: Self-pay | Admitting: Physical Therapy

## 2019-05-06 DIAGNOSIS — M25661 Stiffness of right knee, not elsewhere classified: Secondary | ICD-10-CM | POA: Diagnosis present

## 2019-05-06 DIAGNOSIS — R600 Localized edema: Secondary | ICD-10-CM | POA: Diagnosis present

## 2019-05-06 DIAGNOSIS — M25561 Pain in right knee: Secondary | ICD-10-CM | POA: Insufficient documentation

## 2019-05-06 DIAGNOSIS — R262 Difficulty in walking, not elsewhere classified: Secondary | ICD-10-CM | POA: Insufficient documentation

## 2019-05-06 NOTE — Therapy (Signed)
[place], [place], [address] Phone: [phone]   Fax:  [phone]  Physical Therapy Treatment  Patient Details  Name: Lisa Wheeler MRN: [id number] Date of Birth: 1951-08-25 Referring Provider (PT): Lucey   Encounter Date: 05/06/2019  PT End of Session - 05/06/19 1207    Visit Number  7    Date for PT Re-Evaluation  06/12/19    PT Start Time  1055    PT Stop Time  1151    PT Time Calculation (min)  56 min    Activity Tolerance  Patient tolerated treatment well    Behavior During Therapy  [facility] for tasks assessed/performed       Past Medical History:  Diagnosis Date  . Arthritis   . Elevated lipids   . Hypertension   . Osteoarthritis of right knee    Severe  . Osteopenia    right hip    Past Surgical History:  Procedure Laterality Date  . CLOSED REDUCTION HAND FRACTURE    . OOPHORECTOMY     right  . TOTAL KNEE ARTHROPLASTY Right 02/23/2018   Procedure: TOTAL KNEE ARTHROPLASTY;  Surgeon: Vickey Huger, MD;  Location: [place];  Service: Orthopedics;  Laterality: Right;  . TOTAL KNEE ARTHROPLASTY Left 06/01/2018   Procedure: LEFT TOTAL KNEE ARTHROPLASTY;  Surgeon: Vickey Huger, MD;  [facility];  Service: Orthopedics;  Laterality: Left;  Adductor Block  . TUBAL LIGATION  1985  . WRIST SURGERY  2/12    There were no vitals filed for this visit.  Subjective Assessment - 05/06/19 1057    Subjective  Patient reports that she is still doing well, reports that she has been able to do the stairs normally, still some difficulty with her bike    Currently in Pain?  Yes    Pain Score  2     Pain Location  Knee    Pain Orientation  Right                       [facility] Adult PT Treatment/Exercise - 05/06/19 0001      Ambulation/Gait   Gait Comments  stairs step over step up and dwn, lunge stretch on stairs      Knee/Hip Exercises: Aerobic   Elliptical  I=10, R=5 x 3  minutes    Recumbent Bike  seat #9 4 minutes    Nustep  level 5 x 6 minutes      Knee/Hip Exercises: Machines for Strengthening   Cybex Knee Extension  5# 2x10    Cybex Knee Flexion  20# 2x10    Total Gym Leg Press  20# 2x10, single leg 20# x10, both legs no weight working on flexion      Knee/Hip Exercises: Standing   Other Standing Knee Exercises  lunge stretch      Electrical Stimulation   Electrical Stimulation Location  right knee area    Electrical Stimulation Action  IFC    Electrical Stimulation Parameters  supine    Electrical Stimulation Goals  Pain      Manual Therapy   Manual Therapy  Joint mobilization;Soft tissue mobilization;Passive ROM    Joint Mobilization  some gentle joint mobs with some gentle passive stretch    Soft tissue mobilization  to the quad , ITB area    Passive ROM  flexion and extension  PT Short Term Goals - 04/22/19 1441      PT SHORT TERM GOAL #1   Title  indepdndent with initial HEP    Status  Achieved        PT Long Term Goals - 05/06/19 1209      PT LONG TERM GOAL #1   Title  understand RICE    Status  Achieved      PT LONG TERM GOAL #2   Title  walk with a better gait, demonstrating a natural knee bend    Status  Partially Met      PT LONG TERM GOAL #4   Title  decrease pain 50%    Status  Partially Met            Plan - 05/06/19 1208    Clinical Impression Statement  Patient continues to do better, less pain and better ROM.  She remains tight in flexion and extension, but has not had the "locking in 13 days"    PT Next Visit Plan  continue to push function    Consulted and Agree with Plan of Care  Patient       Patient will benefit from skilled therapeutic intervention in order to improve the following deficits and impairments:  Abnormal gait, Decreased mobility, Pain, Increased muscle spasms, Decreased strength, Decreased range of motion, Decreased endurance, Decreased activity tolerance,  Decreased balance, Difficulty walking, Impaired flexibility  Visit Diagnosis: Acute pain of right knee  Stiffness of right knee, not elsewhere classified  Difficulty in walking, not elsewhere classified     Problem List Patient Active Problem List   Diagnosis Date Noted  . S/P total knee replacement 02/23/2018  . Essential hypertension 08/08/2015  . Osteoporosis 06/15/2014    Sumner Boast., PT 05/06/2019, 12:10 PM  [place] [address] [place], [place], [address] Phone: [phone]   Fax:  [phone]  Name: Lisa Wheeler MRN: [id number] Date of Birth: March 02, 1951

## 2019-05-11 ENCOUNTER — Ambulatory Visit: Payer: Medicare Other | Admitting: Physical Therapy

## 2019-05-11 ENCOUNTER — Other Ambulatory Visit: Payer: Self-pay

## 2019-05-11 ENCOUNTER — Encounter: Payer: Self-pay | Admitting: Physical Therapy

## 2019-05-11 DIAGNOSIS — M25561 Pain in right knee: Secondary | ICD-10-CM

## 2019-05-11 DIAGNOSIS — R262 Difficulty in walking, not elsewhere classified: Secondary | ICD-10-CM

## 2019-05-11 DIAGNOSIS — M25661 Stiffness of right knee, not elsewhere classified: Secondary | ICD-10-CM

## 2019-05-11 DIAGNOSIS — R600 Localized edema: Secondary | ICD-10-CM

## 2019-05-11 NOTE — Therapy (Signed)
[place], [place], [address] Phone: [phone]   Fax:  [phone]  Physical Therapy Treatment  Patient Details  Name: Lisa Wheeler MRN: [id number] Date of Birth: 21-Mar-1951 Referring Provider (PT): Lucey   Encounter Date: 05/11/2019  PT End of Session - 05/11/19 1054    Visit Number  8    Date for PT Re-Evaluation  06/12/19    PT Start Time  1008    PT Stop Time  1110    PT Time Calculation (min)  62 min    Activity Tolerance  Patient tolerated treatment well    Behavior During Therapy  [facility] for tasks assessed/performed       Past Medical History:  Diagnosis Date  . Arthritis   . Elevated lipids   . Hypertension   . Osteoarthritis of right knee    Severe  . Osteopenia    right hip    Past Surgical History:  Procedure Laterality Date  . CLOSED REDUCTION HAND FRACTURE    . OOPHORECTOMY     right  . TOTAL KNEE ARTHROPLASTY Right 02/23/2018   Procedure: TOTAL KNEE ARTHROPLASTY;  Surgeon: Vickey Huger, MD;  Location: [place];  Service: Orthopedics;  Laterality: Right;  . TOTAL KNEE ARTHROPLASTY Left 06/01/2018   Procedure: LEFT TOTAL KNEE ARTHROPLASTY;  Surgeon: Vickey Huger, MD;  [facility];  Service: Orthopedics;  Laterality: Left;  Adductor Block  . TUBAL LIGATION  1985  . WRIST SURGERY  2/12    There were no vitals filed for this visit.  Subjective Assessment - 05/11/19 1018    Subjective  Patient reports that she is riding her bikew some at home without any ill affects    Currently in Pain?  Yes    Pain Score  2     Pain Location  Knee    Pain Orientation  Right                       [facility] Adult PT Treatment/Exercise - 05/11/19 0001      Ambulation/Gait   Gait Comments  stairs step over step up and dwn, lunge stretch on stairs, resisted gait fwd and backward      Knee/Hip Exercises: Aerobic   Elliptical  I=10, R=5 x 3 minutes    Recumbent Bike   seat #9 4 minutes      Knee/Hip Exercises: Machines for Strengthening   Cybex Knee Extension  5# 2x10    Cybex Knee Flexion  20# 2x10    Total Gym Leg Press  20# 2x10, single leg 20# x10, both legs no weight working on flexion      Knee/Hip Exercises: Supine   Other Supine Knee/Hip Exercises  feet on ball K2C, bridges      Electrical Stimulation   Electrical Stimulation Location  right knee area    Electrical Stimulation Action  IFC    Electrical Stimulation Parameters  supine    Electrical Stimulation Goals  Pain      Manual Therapy   Manual Therapy  Joint mobilization;Soft tissue mobilization;Passive ROM    Joint Mobilization  some gentle joint mobs with some gentle passive stretch    Soft tissue mobilization  to the quad , ITB area    Passive ROM  flexion and extension               PT Short Term Goals - 04/22/19 1441  PT SHORT TERM GOAL #1   Title  indepdndent with initial HEP    Status  Achieved        PT Long Term Goals - 05/11/19 1055      PT LONG TERM GOAL #2   Title  walk with a better gait, demonstrating a natural knee bend    Status  Partially Met      PT LONG TERM GOAL #3   Title  increase AROM to 5-115 degrees flexion    Status  Partially Met            Plan - 05/11/19 1055    Clinical Impression Statement  Patient still with no c/o the locking recently, is feeling better and reports moving better, she had a lot of pain with the passive extension    PT Next Visit Plan  continue to push function    Consulted and Agree with Plan of Care  Patient       Patient will benefit from skilled therapeutic intervention in order to improve the following deficits and impairments:  Abnormal gait, Decreased mobility, Pain, Increased muscle spasms, Decreased strength, Decreased range of motion, Decreased endurance, Decreased activity tolerance, Decreased balance, Difficulty walking, Impaired flexibility  Visit Diagnosis: Acute pain of right  knee  Stiffness of right knee, not elsewhere classified  Difficulty in walking, not elsewhere classified  Localized edema     Problem List Patient Active Problem List   Diagnosis Date Noted  . S/P total knee replacement 02/23/2018  . Essential hypertension 08/08/2015  . Osteoporosis 06/15/2014    Sumner Boast., PT 05/11/2019, 10:56 AM  [place] [address] [place], [place], [address] Phone: [phone]   Fax:  [phone]  Name: Lisa Wheeler MRN: [id number] Date of Birth: Jan 15, 1951

## 2019-05-14 ENCOUNTER — Other Ambulatory Visit: Payer: Self-pay

## 2019-05-14 ENCOUNTER — Encounter: Payer: Self-pay | Admitting: Physical Therapy

## 2019-05-14 ENCOUNTER — Ambulatory Visit: Payer: Medicare Other | Admitting: Physical Therapy

## 2019-05-14 DIAGNOSIS — M25661 Stiffness of right knee, not elsewhere classified: Secondary | ICD-10-CM

## 2019-05-14 DIAGNOSIS — M25561 Pain in right knee: Secondary | ICD-10-CM | POA: Diagnosis not present

## 2019-05-14 DIAGNOSIS — R262 Difficulty in walking, not elsewhere classified: Secondary | ICD-10-CM

## 2019-05-14 DIAGNOSIS — R600 Localized edema: Secondary | ICD-10-CM

## 2019-05-14 NOTE — Therapy (Signed)
[place], [place], [address] Phone: [phone]   Fax:  [phone]  Physical Therapy Treatment  Patient Details  Name: Lisa Wheeler MRN: [id number] Date of Birth: [birth year] Referring Provider (PT): Lucey   Encounter Date: 05/14/2019  PT End of Session - 05/14/19 1059    Visit Number  9    Date for PT Re-Evaluation  06/12/19    PT Start Time  1014    PT Stop Time  1111    PT Time Calculation (min)  57 min    Activity Tolerance  Patient tolerated treatment well    Behavior During Therapy  [facility] for tasks assessed/performed       Past Medical History:  Diagnosis Date  . Arthritis   . Elevated lipids   . Hypertension   . Osteoarthritis of right knee    Severe  . Osteopenia    right hip    Past Surgical History:  Procedure Laterality Date  . CLOSED REDUCTION HAND FRACTURE    . OOPHORECTOMY     right  . TOTAL KNEE ARTHROPLASTY Right 02/23/2018   Procedure: TOTAL KNEE ARTHROPLASTY;  Surgeon: Vickey Huger, MD;  Location: [facility];  Service: Orthopedics;  Laterality: Right;  . TOTAL KNEE ARTHROPLASTY Left 06/01/2018   Procedure: LEFT TOTAL KNEE ARTHROPLASTY;  Surgeon: Vickey Huger, MD;  [facility];  Service: Orthopedics;  Laterality: Left;  Adductor Block  . TUBAL LIGATION  1985  . WRIST SURGERY  2/12    There were no vitals filed for this visit.  Subjective Assessment - 05/14/19 1016    Subjective  Reports that the leg press caused some HS and hip soreness, reports that it has gone away    Currently in Pain?  Yes    Pain Score  3     Pain Location  Knee    Pain Orientation  Right    Aggravating Factors   bending, walking                       [facility] Adult PT Treatment/Exercise - 05/14/19 0001      Ambulation/Gait   Gait Comments  stairs step over step up and dwn, lunge stretch on stairs, resisted gait fwd and backward      Knee/Hip Exercises: Stretches   Press photographer  Both;3 reps;20 seconds      Knee/Hip Exercises: Aerobic   Elliptical  I=10, R=5 x 4 minutes    Recumbent Bike  seat #8 4 minutes    Nustep  level 5 x 6 minutes      Knee/Hip Exercises: Machines for Strengthening   Cybex Knee Extension  5# 2x10    Cybex Knee Flexion  20# 2x10    Total Gym Leg Press  20# 2x10, single leg 20# x10, both legs no weight working on flexion      Knee/Hip Exercises: Standing   Other Standing Knee Exercises  lunge stretch      Electrical Stimulation   Electrical Stimulation Location  right knee area    Electrical Stimulation Action  IFC    Electrical Stimulation Parameters  supine    Electrical Stimulation Goals  Pain      Manual Therapy   Manual Therapy  Joint mobilization;Soft tissue mobilization;Passive ROM    Joint Mobilization  some gentle joint mobs with some gentle passive stretch    Soft tissue mobilization  to the quad , ITB area  Passive ROM  flexion and extension               PT Short Term Goals - 04/22/19 1441      PT SHORT TERM GOAL #1   Title  indepdndent with initial HEP    Status  Achieved        PT Long Term Goals - 05/14/19 1102      PT LONG TERM GOAL #3   Title  increase AROM to 5-115 degrees flexion    Status  Partially Met      PT LONG TERM GOAL #4   Title  decrease pain 50%    Status  Partially Met            Plan - 05/14/19 1101    Clinical Impression Statement  Patient doing well, c/o some tightness and some soreness, does great on stairs, has some decreased knee extension.  Cues to get better quad activation for closer to [facility] on activities    PT Next Visit Plan  continue to push function and try to get a little more extension    Consulted and Agree with Plan of Care  Patient       Patient will benefit from skilled therapeutic intervention in order to improve the following deficits and impairments:  Abnormal gait, Decreased mobility, Pain, Increased muscle spasms, Decreased  strength, Decreased range of motion, Decreased endurance, Decreased activity tolerance, Decreased balance, Difficulty walking, Impaired flexibility  Visit Diagnosis: Acute pain of right knee  Stiffness of right knee, not elsewhere classified  Difficulty in walking, not elsewhere classified  Localized edema     Problem List Patient Active Problem List   Diagnosis Date Noted  . S/P total knee replacement 02/23/2018  . Essential hypertension 08/08/2015  . Osteoporosis 06/15/2014    Sumner Boast., PT 05/14/2019, 11:12 AM  [place] [address] [place], [place], [address] Phone: [phone]   Fax:  [phone]  Name: Lisa Wheeler MRN: [id number] Date of Birth: 1951-01-16

## 2019-05-17 ENCOUNTER — Ambulatory Visit: Payer: Medicare Other | Admitting: Physical Therapy

## 2019-05-17 ENCOUNTER — Other Ambulatory Visit: Payer: Self-pay

## 2019-05-17 ENCOUNTER — Encounter: Payer: Self-pay | Admitting: Physical Therapy

## 2019-05-17 DIAGNOSIS — M25561 Pain in right knee: Secondary | ICD-10-CM | POA: Diagnosis not present

## 2019-05-17 DIAGNOSIS — R600 Localized edema: Secondary | ICD-10-CM

## 2019-05-17 DIAGNOSIS — M25661 Stiffness of right knee, not elsewhere classified: Secondary | ICD-10-CM

## 2019-05-17 DIAGNOSIS — R262 Difficulty in walking, not elsewhere classified: Secondary | ICD-10-CM

## 2019-05-17 NOTE — Therapy (Signed)
[place], [place], [address] Phone: [phone]   Fax:  [phone]  Physical Therapy Treatment  Patient Details  Name: Lisa Wheeler MRN: [id number] Date of Birth: 1951/05/16 Referring Provider (PT): Lucey   Encounter Date: 05/17/2019  PT End of Session - 05/17/19 1430    Visit Number  10    Date for PT Re-Evaluation  06/12/19    PT Start Time  1306    PT Stop Time  1404    PT Time Calculation (min)  58 min    Activity Tolerance  Patient tolerated treatment well    Behavior During Therapy  [facility] for tasks assessed/performed       Past Medical History:  Diagnosis Date  . Arthritis   . Elevated lipids   . Hypertension   . Osteoarthritis of right knee    Severe  . Osteopenia    right hip    Past Surgical History:  Procedure Laterality Date  . CLOSED REDUCTION HAND FRACTURE    . OOPHORECTOMY     right  . TOTAL KNEE ARTHROPLASTY Right 02/23/2018   Procedure: TOTAL KNEE ARTHROPLASTY;  Surgeon: Vickey Huger, MD;  Location: [facility];  Service: Orthopedics;  Laterality: Right;  . TOTAL KNEE ARTHROPLASTY Left 06/01/2018   Procedure: LEFT TOTAL KNEE ARTHROPLASTY;  Surgeon: Vickey Huger, MD;  [facility];  Service: Orthopedics;  Laterality: Left;  Adductor Block  . TUBAL LIGATION  1985  . WRIST SURGERY  2/12    There were no vitals filed for this visit.  Subjective Assessment - 05/17/19 1308    Subjective  Patient reports that she did very well until yesterday and then had tightness and soreness.    Currently in Pain?  Yes    Pain Score  3     Pain Location  Knee    Pain Orientation  Right    Aggravating Factors   walking                       [facility] Adult PT Treatment/Exercise - 05/17/19 0001      Knee/Hip Exercises: Aerobic   Elliptical  I=10, R=5 x 5 minutes      Knee/Hip Exercises: Machines for Strengthening   Cybex Knee Extension  5# 3x10    Cybex Knee  Flexion  20# 3x10    Total Gym Leg Press  20# 2x10, single leg 20# x10, both legs no weight working on flexion      Knee/Hip Exercises: Standing   Other Standing Knee Exercises  lunge stretch      Moist Heat Therapy   Number Minutes Moist Heat  12 Minutes    Moist Heat Location  Hip      Electrical Stimulation   Electrical Stimulation Location  right medial knee and thigh    Electrical Stimulation Action  IFC    Electrical Stimulation Parameters  supine    Electrical Stimulation Goals  Pain      Manual Therapy   Manual Therapy  Joint mobilization;Soft tissue mobilization;Passive ROM    Joint Mobilization  some gentle joint mobs with some gentle passive stretch    Soft tissue mobilization  to the quad , ITB area    Passive ROM  flexion and extension               PT Short Term Goals - 04/22/19 1441      PT SHORT TERM  GOAL #1   Title  indepdndent with initial HEP    Status  Achieved        PT Long Term Goals - 05/14/19 1102      PT LONG TERM GOAL #3   Title  increase AROM to 5-115 degrees flexion    Status  Partially Met      PT LONG TERM GOAL #4   Title  decrease pain 50%    Status  Partially Met            Plan - 05/17/19 1434    Clinical Impression Statement  Patient doing well with less pain and less locking, she still walks with knees in some flexion, she is doing great on the stairs.  Having some cramping in the adductors    PT Next Visit Plan  continue to push function and try to get a little more extension    Consulted and Agree with Plan of Care  Patient       Patient will benefit from skilled therapeutic intervention in order to improve the following deficits and impairments:  Abnormal gait, Decreased mobility, Pain, Increased muscle spasms, Decreased strength, Decreased range of motion, Decreased endurance, Decreased activity tolerance, Decreased balance, Difficulty walking, Impaired flexibility  Visit Diagnosis: Acute pain of right  knee  Stiffness of right knee, not elsewhere classified  Difficulty in walking, not elsewhere classified  Localized edema     Problem List Patient Active Problem List   Diagnosis Date Noted  . S/P total knee replacement 02/23/2018  . Essential hypertension 08/08/2015  . Osteoporosis 06/15/2014    Sumner Boast., PT 05/17/2019, 2:40 PM  [place] [address] [place], [place], [address] Phone: [phone]   Fax:  [phone]  Name: Lisa Wheeler MRN: [id number] Date of Birth: [birth year]

## 2019-05-20 ENCOUNTER — Other Ambulatory Visit: Payer: Self-pay

## 2019-05-20 ENCOUNTER — Ambulatory Visit: Payer: Medicare Other | Admitting: Physical Therapy

## 2019-05-20 ENCOUNTER — Encounter: Payer: Self-pay | Admitting: Physical Therapy

## 2019-05-20 DIAGNOSIS — M25561 Pain in right knee: Secondary | ICD-10-CM

## 2019-05-20 DIAGNOSIS — R262 Difficulty in walking, not elsewhere classified: Secondary | ICD-10-CM

## 2019-05-20 DIAGNOSIS — M25661 Stiffness of right knee, not elsewhere classified: Secondary | ICD-10-CM

## 2019-05-20 NOTE — Therapy (Signed)
[facility] [address] [place], [place], [address] Phone: [phone]   Fax:  [phone]  Physical Therapy Treatment  Patient Details  Name: Lisa Wheeler MRN: [id number] Date of Birth: 02/23/1951 Referring Provider (PT): Lucey   Encounter Date: 05/20/2019  PT End of Session - 05/20/19 1434    Visit Number  11    Date for PT Re-Evaluation  06/12/19    PT Start Time  1310    PT Stop Time  1408    PT Time Calculation (min)  58 min    Activity Tolerance  Patient tolerated treatment well    Behavior During Therapy  [facility] for tasks assessed/performed       Past Medical History:  Diagnosis Date  . Arthritis   . Elevated lipids   . Hypertension   . Osteoarthritis of right knee    Severe  . Osteopenia    right hip    Past Surgical History:  Procedure Laterality Date  . CLOSED REDUCTION HAND FRACTURE    . OOPHORECTOMY     right  . TOTAL KNEE ARTHROPLASTY Right 02/23/2018   Procedure: TOTAL KNEE ARTHROPLASTY;  Surgeon: Lucey, Steve, MD;  [facility];  Service: Orthopedics;  Laterality: Right;  . TOTAL KNEE ARTHROPLASTY Left 06/01/2018   Procedure: LEFT TOTAL KNEE ARTHROPLASTY;  Surgeon: Lucey, Steve, MD;  [facility];  Service: Orthopedics;  Laterality: Left;  Adductor Block  . TUBAL LIGATION  1985  . WRIST SURGERY  2/12    There were no vitals filed for this visit.  Subjective Assessment - 05/20/19 1313    Subjective  Patietn reports that the thigh is still sore.  She reports less pain and doing well with stiffness    Currently in Pain?  Yes    Pain Score  1     Pain Location  Knee    Pain Orientation  Right    Aggravating Factors   stiff in the mornings                       [facility] Adult PT Treatment/Exercise - 05/20/19 0001      Ambulation/Gait   Gait Comments  stairs step over step two flights and lunge stretch      Knee/Hip Exercises: Aerobic   Elliptical  I=10, R=5 x 5  minutes      Knee/Hip Exercises: Machines for Strengthening   Cybex Knee Extension  5# 3x10    Cybex Knee Flexion  20# 3x10    Total Gym Leg Press  20# 2x10, single leg 20# x10, both legs no weight working on flexion      Knee/Hip Exercises: Standing   Terminal Knee Extension  15 reps    Terminal Knee Extension Limitations  ball behind knee    Walking with Sports Cord  alll directions      Knee/Hip Exercises: Supine   Short Arc Quad Sets  Right;3 sets;10 reps    Short Arc Quad Sets Limitations  3#      Moist Heat Therapy   Number Minutes Moist Heat  12 Minutes    Moist Heat Location  Hip      Electrical Stimulation   Electrical Stimulation Location  right medial knee and thigh    Electrical Stimulation Action  IFC    Electrical Stimulation Parameters  supine    Electrical Stimulation Goals  Pain      Manual Therapy     Manual Therapy  Joint mobilization;Soft tissue mobilization;Passive ROM    Joint Mobilization  some gentle joint mobs with some gentle passive stretch    Soft tissue mobilization  to the quad , ITB area    Passive ROM  flexion and extension               PT Short Term Goals - 04/22/19 1441      PT SHORT TERM GOAL #1   Title  indepdndent with initial HEP    Status  Achieved        PT Long Term Goals - 05/20/19 1436      PT LONG TERM GOAL #2   Title  walk with a better gait, demonstrating a natural knee bend    Status  Partially Met      PT LONG TERM GOAL #4   Title  decrease pain 50%    Status  Partially Met            Plan - 05/20/19 1435    Clinical Impression Statement  Had some increased soreness int he right groin after the last visit, she remains very tight in the posterior knee and does not straighten the leg with walking.    PT Next Visit Plan  continue to push function and try to get a little more extension    Consulted and Agree with Plan of Care  Patient       Patient will benefit from skilled therapeutic intervention  in order to improve the following deficits and impairments:  Abnormal gait, Decreased mobility, Pain, Increased muscle spasms, Decreased strength, Decreased range of motion, Decreased endurance, Decreased activity tolerance, Decreased balance, Difficulty walking, Impaired flexibility  Visit Diagnosis: Acute pain of right knee  Stiffness of right knee, not elsewhere classified  Difficulty in walking, not elsewhere classified     Problem List Patient Active Problem List   Diagnosis Date Noted  . S/P total knee replacement 02/23/2018  . Essential hypertension 08/08/2015  . Osteoporosis 06/15/2014    Sumner Boast., PT 05/20/2019, 2:37 PM  [place], [place], [address] Phone: [phone]   Fax:  [phone]  Name: Lisa Wheeler MRN: [id number] Date of Birth: [birth year]

## 2019-05-24 ENCOUNTER — Ambulatory Visit: Payer: Medicare Other | Admitting: Physical Therapy

## 2019-05-24 ENCOUNTER — Encounter: Payer: Self-pay | Admitting: Physical Therapy

## 2019-05-24 ENCOUNTER — Other Ambulatory Visit: Payer: Self-pay

## 2019-05-24 DIAGNOSIS — M25661 Stiffness of right knee, not elsewhere classified: Secondary | ICD-10-CM

## 2019-05-24 DIAGNOSIS — M25561 Pain in right knee: Secondary | ICD-10-CM | POA: Diagnosis not present

## 2019-05-24 DIAGNOSIS — R262 Difficulty in walking, not elsewhere classified: Secondary | ICD-10-CM

## 2019-05-24 NOTE — Therapy (Signed)
[place], [place], [address] Phone: [phone]   Fax:  [phone]  Physical Therapy Treatment  Patient Details  Name: Lisa Wheeler MRN: [id number] Date of Birth: [birth year] Referring Provider (PT): Lucey   Encounter Date: 05/24/2019  PT End of Session - 05/24/19 1352    Visit Number  12    Date for PT Re-Evaluation  06/12/19    PT Start Time  1312    PT Stop Time  1408    PT Time Calculation (min)  56 min    Activity Tolerance  Patient tolerated treatment well    Behavior During Therapy  [facility] for tasks assessed/performed       Past Medical History:  Diagnosis Date  . Arthritis   . Elevated lipids   . Hypertension   . Osteoarthritis of right knee    Severe  . Osteopenia    right hip    Past Surgical History:  Procedure Laterality Date  . CLOSED REDUCTION HAND FRACTURE    . OOPHORECTOMY     right  . TOTAL KNEE ARTHROPLASTY Right 02/23/2018   Procedure: TOTAL KNEE ARTHROPLASTY;  Surgeon: Vickey Huger, MD;  Location: [facility];  Service: Orthopedics;  Laterality: Right;  . TOTAL KNEE ARTHROPLASTY Left 06/01/2018   Procedure: LEFT TOTAL KNEE ARTHROPLASTY;  Surgeon: Vickey Huger, MD;  [facility];  Service: Orthopedics;  Laterality: Left;  Adductor Block  . TUBAL LIGATION  1985  . WRIST SURGERY  2/12    There were no vitals filed for this visit.  Subjective Assessment - 05/24/19 1312    Subjective  reports that she was very stiff yesterday.  No locking and no pain, just stiff    Currently in Pain?  No/denies                       [facility] Adult PT Treatment/Exercise - 05/24/19 0001      Ambulation/Gait   Gait Comments  down stiatrs and then outside a lap and a half, then resisted gait all directions      Knee/Hip Exercises: Aerobic   Recumbent Bike  seat #4 minutes level 1      Knee/Hip Exercises: Machines for Strengthening   Cybex Knee Extension  5# 3x10,  cues for TKE    Cybex Knee Flexion  25# 3x10      Knee/Hip Exercises: Seated   Sit to Sand  without UE support;15 reps   elevated as getting up without hands is how she feels she hu     Electrical Stimulation   Electrical Stimulation Location  right medial knee and thigh    Electrical Stimulation Action  IFC    Electrical Stimulation Parameters  supine    Electrical Stimulation Goals  Pain      Manual Therapy   Manual Therapy  Joint mobilization;Soft tissue mobilization;Passive ROM    Joint Mobilization  some gentle joint mobs with some gentle passive stretch    Soft tissue mobilization  to the quad , ITB area    Passive ROM  flexion and extension               PT Short Term Goals - 04/22/19 1441      PT SHORT TERM GOAL #1   Title  indepdndent with initial HEP    Status  Achieved        PT Long Term Goals - 05/20/19 1436  PT LONG TERM GOAL #2   Title  walk with a better gait, demonstrating a natural knee bend    Status  Partially Met      PT LONG TERM GOAL #4   Title  decrease pain 50%    Status  Partially Met            Plan - 05/24/19 1353    Clinical Impression Statement  Patient was stiff yesterday, I added gait outside, and sit to stand wihtout UE support, this was scary to her as she feels this is how she hurt her knee, We did this with slight elevation and me assisting with tband at first, she was a little sore after so we finished with estims    PT Next Visit Plan  continue to push function and try to get a little more extension    Consulted and Agree with Plan of Care  Patient       Patient will benefit from skilled therapeutic intervention in order to improve the following deficits and impairments:  Abnormal gait, Decreased mobility, Pain, Increased muscle spasms, Decreased strength, Decreased range of motion, Decreased endurance, Decreased activity tolerance, Decreased balance, Difficulty walking, Impaired flexibility  Visit  Diagnosis: Acute pain of right knee  Stiffness of right knee, not elsewhere classified  Difficulty in walking, not elsewhere classified     Problem List Patient Active Problem List   Diagnosis Date Noted  . S/P total knee replacement 02/23/2018  . Essential hypertension 08/08/2015  . Osteoporosis 06/15/2014    Sumner Boast., PT 05/24/2019, 1:56 PM  [place] [address] [place], [place], [address] Phone: [phone]   Fax:  [phone]  Name: Lisa Wheeler MRN: [id number] Date of Birth: 15-May-1951

## 2019-05-27 ENCOUNTER — Other Ambulatory Visit: Payer: Self-pay

## 2019-05-27 ENCOUNTER — Ambulatory Visit: Payer: Medicare Other | Admitting: Physical Therapy

## 2019-05-27 ENCOUNTER — Encounter: Payer: Self-pay | Admitting: Physical Therapy

## 2019-05-27 DIAGNOSIS — M25561 Pain in right knee: Secondary | ICD-10-CM | POA: Diagnosis not present

## 2019-05-27 DIAGNOSIS — M25661 Stiffness of right knee, not elsewhere classified: Secondary | ICD-10-CM

## 2019-05-27 DIAGNOSIS — R262 Difficulty in walking, not elsewhere classified: Secondary | ICD-10-CM

## 2019-05-27 NOTE — Therapy (Signed)
[facility] [address] [place], [place], [address] Phone: [phone]   Fax:  [phone]  Physical Therapy Treatment  Patient Details  Name: Lisa Wheeler MRN: [id number] Date of Birth: 1950-11-15 Referring Provider (PT): Lucey   Encounter Date: 05/27/2019  PT End of Session - 05/27/19 1351    Visit Number  13    Date for PT Re-Evaluation  06/12/19    PT Start Time  1306    PT Stop Time  1400    PT Time Calculation (min)  54 min    Activity Tolerance  Patient tolerated treatment well    Behavior During Therapy  [facility] for tasks assessed/performed       Past Medical History:  Diagnosis Date  . Arthritis   . Elevated lipids   . Hypertension   . Osteoarthritis of right knee    Severe  . Osteopenia    right hip    Past Surgical History:  Procedure Laterality Date  . CLOSED REDUCTION HAND FRACTURE    . OOPHORECTOMY     right  . TOTAL KNEE ARTHROPLASTY Right 02/23/2018   Procedure: TOTAL KNEE ARTHROPLASTY;  Surgeon: Dannielle Huh, MD;  [facility];  Service: Orthopedics;  Laterality: Right;  . TOTAL KNEE ARTHROPLASTY Left 06/01/2018   Procedure: LEFT TOTAL KNEE ARTHROPLASTY;  Surgeon: Dannielle Huh, MD;  [facility];  Service: Orthopedics;  Laterality: Left;  Adductor Block  . TUBAL LIGATION  1985  . WRIST SURGERY  2/12    There were no vitals filed for this visit.  Subjective Assessment - 05/27/19 1306    Subjective  I am really feeling good, I think the last few treatments have really helped    Currently in Pain?  Yes    Pain Score  1     Pain Location  Knee    Pain Orientation  Right                       [facility] Adult PT Treatment/Exercise - 05/27/19 0001      Knee/Hip Exercises: Aerobic   Elliptical  I=10, R=5 x 5 minutes    Recumbent Bike  seat #4 5 minutes level 1      Knee/Hip Exercises: Machines for Strengthening   Cybex Knee Extension  10# 3x10    Cybex Knee  Flexion  25# 3x10    Total Gym Leg Press  20# 2x10, single leg 20# x10, both legs no weight working on flexion      Knee/Hip Exercises: Standing   Walking with Sports Cord  alll directions    Other Standing Knee Exercises  lunge stretch      Knee/Hip Exercises: Supine   Short Arc Quad Sets  Right;3 sets;10 reps    Short Arc Quad Sets Limitations  5#    Other Supine Knee/Hip Exercises  feet on ball K2C, bridges with calves on ball and then feet on ball      Electrical Stimulation   Electrical Stimulation Location  right medial knee and thigh    Electrical Stimulation Action  IFC    Electrical Stimulation Parameters  supine    Electrical Stimulation Goals  Pain      Manual Therapy   Manual Therapy  Joint mobilization;Soft tissue mobilization;Passive ROM    Joint Mobilization  some gentle joint mobs with some gentle passive stretch    Soft tissue mobilization  to the quad , ITB area  Passive ROM  flexion and extension               PT Short Term Goals - 04/22/19 1441      PT SHORT TERM GOAL #1   Title  indepdndent with initial HEP    Status  Achieved        PT Long Term Goals - 05/27/19 1353      PT LONG TERM GOAL #2   Title  walk with a better gait, demonstrating a natural knee bend    Status  Achieved            Plan - 05/27/19 1351    Clinical Impression Statement  Patient reports that she is really doing better, still sore and stiff at times, but is walking better.  And reports going up and down stairs at home step over step.  She sees MD next Tuesday    PT Next Visit Plan  will write MD note next week    Consulted and Agree with Plan of Care  Patient       Patient will benefit from skilled therapeutic intervention in order to improve the following deficits and impairments:  Abnormal gait, Decreased mobility, Pain, Increased muscle spasms, Decreased strength, Decreased range of motion, Decreased endurance, Decreased activity tolerance, Decreased  balance, Difficulty walking, Impaired flexibility  Visit Diagnosis: Acute pain of right knee  Stiffness of right knee, not elsewhere classified  Difficulty in walking, not elsewhere classified     Problem List Patient Active Problem List   Diagnosis Date Noted  . S/P total knee replacement 02/23/2018  . Essential hypertension 08/08/2015  . Osteoporosis 06/15/2014    Sumner Boast., PT 05/27/2019, 1:54 PM  [place] [address] [address] [place], [place], [address] Phone: [phone]   Fax:  [phone]  Name: Lisa Wheeler MRN: [id number] Date of Birth: [birth year]

## 2019-05-31 ENCOUNTER — Encounter: Payer: Self-pay | Admitting: Physical Therapy

## 2019-05-31 ENCOUNTER — Other Ambulatory Visit: Payer: Self-pay

## 2019-05-31 ENCOUNTER — Ambulatory Visit: Payer: Medicare Other | Admitting: Physical Therapy

## 2019-05-31 DIAGNOSIS — R600 Localized edema: Secondary | ICD-10-CM

## 2019-05-31 DIAGNOSIS — M25561 Pain in right knee: Secondary | ICD-10-CM | POA: Diagnosis not present

## 2019-05-31 DIAGNOSIS — R262 Difficulty in walking, not elsewhere classified: Secondary | ICD-10-CM

## 2019-05-31 DIAGNOSIS — M25661 Stiffness of right knee, not elsewhere classified: Secondary | ICD-10-CM

## 2019-05-31 NOTE — Therapy (Signed)
[place], [place], [address] Phone: [phone]   Fax:  [phone]  Physical Therapy Treatment  Patient Details  Name: Lisa Wheeler MRN: [id number] Date of Birth: 08/06/1951 Referring Provider (PT): Lucey   Encounter Date: 05/31/2019  PT End of Session - 05/31/19 1005    Visit Number  14    Date for PT Re-Evaluation  06/12/19    PT Start Time  0922    PT Stop Time  1016    PT Time Calculation (min)  54 min    Activity Tolerance  Patient tolerated treatment well    Behavior During Therapy  [facility] for tasks assessed/performed       Past Medical History:  Diagnosis Date  . Arthritis   . Elevated lipids   . Hypertension   . Osteoarthritis of right knee    Severe  . Osteopenia    right hip    Past Surgical History:  Procedure Laterality Date  . CLOSED REDUCTION HAND FRACTURE    . OOPHORECTOMY     right  . TOTAL KNEE ARTHROPLASTY Right 02/23/2018   Procedure: TOTAL KNEE ARTHROPLASTY;  Surgeon: Vickey Huger, MD;  Location: [place];  Service: Orthopedics;  Laterality: Right;  . TOTAL KNEE ARTHROPLASTY Left 06/01/2018   Procedure: LEFT TOTAL KNEE ARTHROPLASTY;  Surgeon: Vickey Huger, MD;  Location: [facility];  Service: Orthopedics;  Laterality: Left;  Adductor Block  . TUBAL LIGATION  1985  . WRIST SURGERY  2/12    There were no vitals filed for this visit.  Subjective Assessment - 05/31/19 0926    Subjective  Patient reports that she has been doing well, less pain, no instances of it "locking" in the past 4-5 weeks.  She reports walking better and having better ROM    Pain Score  1     Pain Location  Knee    Pain Orientation  Right    Pain Descriptors / Indicators  Tightness         [facility] PT Assessment - 05/31/19 0001      AROM   Right Knee Extension  18    Right Knee Flexion  110      PROM   Right Knee Extension  10    Right Knee Flexion  117                    [facility] Adult PT Treatment/Exercise - 05/31/19 0001      Ambulation/Gait   Gait Comments  gait down stairs step over step, then 1.5 laps around the building working on knee extension and step length      Knee/Hip Exercises: Aerobic   Elliptical  I=10, R=5 x 5 minutes    Recumbent Bike  seat #4 5 minutes level 1      Knee/Hip Exercises: Machines for Strengthening   Cybex Knee Extension  10# 3x10    Cybex Knee Flexion  25# 3x10    Total Gym Leg Press  20# 2x10, single leg 20# x10, both legs no weight working on flexion      Acupuncturist Location  right medial knee and thigh    Electrical Stimulation Action  IFC    Electrical Stimulation Parameters  supine    Electrical Stimulation Goals  Pain      Ultrasound   Ultrasound Location  right quad area    Ultrasound Parameters  100% 1MHz 1.5 w/cm2  PT Short Term Goals - 04/22/19 1441      PT SHORT TERM GOAL #1   Title  indepdndent with initial HEP    Status  Achieved        PT Long Term Goals - 05/31/19 1008      PT LONG TERM GOAL #1   Title  understand RICE    Status  Achieved      PT LONG TERM GOAL #2   Title  walk with a better gait, demonstrating a natural knee bend    Status  Achieved      PT LONG TERM GOAL #3   Title  increase AROM to 5-115 degrees flexion    Status  Partially Met      PT LONG TERM GOAL #4   Title  decrease pain 50%    Status  Achieved      PT LONG TERM GOAL #5   Title  no episodes of knee "locking" over a 4 week period    Status  Achieved            Plan - 05/31/19 1006    Clinical Impression Statement  Patient over thh past 5 weeks has had no instances of "locking".  She has improved her AROM 25 degrees since starting therapy.  She is walking much better with less limp.  She still has stiffness with the biggest limitation being into extension.  We have tried various modalities to help including ionto, [place]  and estim.    PT Next Visit Plan  will probably see over the next week and d/c    Consulted and Agree with Plan of Care  Patient       Patient will benefit from skilled therapeutic intervention in order to improve the following deficits and impairments:  Abnormal gait, Decreased mobility, Pain, Increased muscle spasms, Decreased strength, Decreased range of motion, Decreased endurance, Decreased activity tolerance, Decreased balance, Difficulty walking, Impaired flexibility  Visit Diagnosis: Acute pain of right knee  Stiffness of right knee, not elsewhere classified  Difficulty in walking, not elsewhere classified  Localized edema     Problem List Patient Active Problem List   Diagnosis Date Noted  . S/P total knee replacement 02/23/2018  . Essential hypertension 08/08/2015  . Osteoporosis 06/15/2014    Sumner Boast., PT 05/31/2019, 10:09 AM  [facility] [place] [address] [place], [place], [address] Phone: [phone]   Fax:  [phone]  Name: Lisa Wheeler MRN: [id number] Date of Birth: April 19, 1951

## 2019-06-03 ENCOUNTER — Ambulatory Visit: Payer: Medicare Other | Attending: Physician Assistant | Admitting: Physical Therapy

## 2019-06-03 ENCOUNTER — Other Ambulatory Visit: Payer: Self-pay

## 2019-06-03 ENCOUNTER — Encounter: Payer: Self-pay | Admitting: Physical Therapy

## 2019-06-03 DIAGNOSIS — M25661 Stiffness of right knee, not elsewhere classified: Secondary | ICD-10-CM | POA: Insufficient documentation

## 2019-06-03 DIAGNOSIS — R262 Difficulty in walking, not elsewhere classified: Secondary | ICD-10-CM | POA: Insufficient documentation

## 2019-06-03 DIAGNOSIS — R600 Localized edema: Secondary | ICD-10-CM | POA: Diagnosis present

## 2019-06-03 DIAGNOSIS — M25561 Pain in right knee: Secondary | ICD-10-CM | POA: Insufficient documentation

## 2019-06-03 NOTE — Therapy (Signed)
[place], [place], [address] Phone: [phone]   Fax:  [phone]  Physical Therapy Treatment  Patient Details  Name: Lisa Wheeler DOBLE MRN: [id number] Date of Birth: 10/06/1950 Referring Provider (PT): Lucey   Encounter Date: 06/03/2019  PT End of Session - 06/03/19 1425    Visit Number  15    Date for PT Re-Evaluation  06/12/19    PT Start Time  1310    PT Stop Time  1405    PT Time Calculation (min)  55 min    Activity Tolerance  Patient tolerated treatment well    Behavior During Therapy  [facility] for tasks assessed/performed       Past Medical History:  Diagnosis Date  . Arthritis   . Elevated lipids   . Hypertension   . Osteoarthritis of right knee    Severe  . Osteopenia    right hip    Past Surgical History:  Procedure Laterality Date  . CLOSED REDUCTION HAND FRACTURE    . OOPHORECTOMY     right  . TOTAL KNEE ARTHROPLASTY Right 02/23/2018   Procedure: TOTAL KNEE ARTHROPLASTY;  Surgeon: Vickey Huger, MD;  Location: [place];  Service: Orthopedics;  Laterality: Right;  . TOTAL KNEE ARTHROPLASTY Left 06/01/2018   Procedure: LEFT TOTAL KNEE ARTHROPLASTY;  Surgeon: Vickey Huger, MD;  Location[facility];  Service: Orthopedics;  Laterality: Left;  Adductor Block  . TUBAL LIGATION  1985  . WRIST SURGERY  2/12    There were no vitals filed for this visit.  Subjective Assessment - 06/03/19 1422    Subjective  Patient saw the MD and he was pleased that she was doing better.  Felt like she should stop later next week and do HEP    Currently in Pain?  Yes    Pain Score  1     Pain Location  Knee    Pain Orientation  Right;Lateral    Pain Descriptors / Indicators  Tightness;Sore    Pain Relieving Factors  standing                       [facility] Adult PT Treatment/Exercise - 06/03/19 0001      Knee/Hip Exercises: Aerobic   Elliptical  I=10, R=5 x 5 minutes      Knee/Hip  Exercises: Machines for Strengthening   Cybex Knee Extension  10# 3x10    Cybex Knee Flexion  25# 3x10    Total Gym Leg Press  20# 2x10, single leg 30# x10, both legs no weight working on flexion      Knee/Hip Exercises: Standing   Terminal Knee Extension  15 reps    Terminal Knee Extension Limitations  ball behind knee    Walking with Sports Cord  alll directions      Knee/Hip Exercises: Supine   Other Supine Knee/Hip Exercises  feet on ball K2C, bridges with calves on ball and then feet on ball      Electrical Stimulation   Electrical Stimulation Location  right medial knee and thigh    Electrical Stimulation Action  IFC    Electrical Stimulation Parameters  supine    Electrical Stimulation Goals  Pain      Manual Therapy   Manual Therapy  Joint mobilization;Soft tissue mobilization;Passive ROM    Joint Mobilization  some gentle joint mobs with some gentle passive stretch    Soft tissue mobilization  to  the quad , ITB area    Passive ROM  flexion and extension               PT Short Term Goals - 04/22/19 1441      PT SHORT TERM GOAL #1   Title  indepdndent with initial HEP    Status  Achieved        PT Long Term Goals - 05/31/19 1008      PT LONG TERM GOAL #1   Title  understand RICE    Status  Achieved      PT LONG TERM GOAL #2   Title  walk with a better gait, demonstrating a natural knee bend    Status  Achieved      PT LONG TERM GOAL #3   Title  increase AROM to 5-115 degrees flexion    Status  Partially Met      PT LONG TERM GOAL #4   Title  decrease pain 50%    Status  Achieved      PT LONG TERM GOAL #5   Title  no episodes of knee "locking" over a 4 week period    Status  Achieved            Plan - 06/03/19 1426    Clinical Impression Statement  Patient saw MD, will work on HEP and mechanics over the next week, may push a little more ROM and strength    PT Next Visit Plan  work on ROM and HEP    Consulted and Agree with Plan of Care   Patient       Patient will benefit from skilled therapeutic intervention in order to improve the following deficits and impairments:  Abnormal gait, Decreased mobility, Pain, Increased muscle spasms, Decreased strength, Decreased range of motion, Decreased endurance, Decreased activity tolerance, Decreased balance, Difficulty walking, Impaired flexibility  Visit Diagnosis: Acute pain of right knee  Stiffness of right knee, not elsewhere classified  Difficulty in walking, not elsewhere classified     Problem List Patient Active Problem List   Diagnosis Date Noted  . S/P total knee replacement 02/23/2018  . Essential hypertension 08/08/2015  . Osteoporosis 06/15/2014    Sumner Boast., PT 06/03/2019, 2:27 PM  [place] [address] [place], [place], [address] Phone: [phone]   Fax:  [phone]  Name: Lisa Wheeler MRN: [id number] Date of Birth: 1951/06/25

## 2019-06-07 ENCOUNTER — Ambulatory Visit: Payer: Medicare Other | Admitting: Physical Therapy

## 2019-06-07 ENCOUNTER — Other Ambulatory Visit: Payer: Self-pay

## 2019-06-07 ENCOUNTER — Encounter: Payer: Self-pay | Admitting: Physical Therapy

## 2019-06-07 DIAGNOSIS — M25561 Pain in right knee: Secondary | ICD-10-CM

## 2019-06-07 DIAGNOSIS — M25661 Stiffness of right knee, not elsewhere classified: Secondary | ICD-10-CM

## 2019-06-07 DIAGNOSIS — R600 Localized edema: Secondary | ICD-10-CM

## 2019-06-07 DIAGNOSIS — R262 Difficulty in walking, not elsewhere classified: Secondary | ICD-10-CM

## 2019-06-07 NOTE — Therapy (Signed)
[place], [place], [address] Phone: [phone]   Fax:  [phone]  Physical Therapy Treatment  Patient Details  Name: Lisa Wheeler MRN: [id number] Date of Birth: 05-Mar-1951 Referring Provider (PT): Lucey   Encounter Date: 06/07/2019  PT End of Session - 06/07/19 1253    Visit Number  16    Date for PT Re-Evaluation  06/12/19    PT Start Time  1005    PT Stop Time  1101    PT Time Calculation (min)  56 min    Activity Tolerance  Patient tolerated treatment well    Behavior During Therapy  [facility] for tasks assessed/performed       Past Medical History:  Diagnosis Date  . Arthritis   . Elevated lipids   . Hypertension   . Osteoarthritis of right knee    Severe  . Osteopenia    right hip    Past Surgical History:  Procedure Laterality Date  . CLOSED REDUCTION HAND FRACTURE    . OOPHORECTOMY     right  . TOTAL KNEE ARTHROPLASTY Right 02/23/2018   Procedure: TOTAL KNEE ARTHROPLASTY;  Surgeon: Vickey Huger, MD;  Location: [place];  Service: Orthopedics;  Laterality: Right;  . TOTAL KNEE ARTHROPLASTY Left 06/01/2018   Procedure: LEFT TOTAL KNEE ARTHROPLASTY;  Surgeon: Vickey Huger, MD;  Location[facility];  Service: Orthopedics;  Laterality: Left;  Adductor Block  . TUBAL LIGATION  1985  . WRIST SURGERY  2/12    There were no vitals filed for this visit.  Subjective Assessment - 06/07/19 1010    Subjective  Reports both knees seemed to get stiff yesterday    Currently in Pain?  Yes    Pain Score  1     Pain Location  Knee    Pain Orientation  Right    Pain Descriptors / Indicators  Tightness;Sore    Aggravating Factors   weather and in the morning                       [facility] Adult PT Treatment/Exercise - 06/07/19 0001      Ambulation/Gait   Gait Comments  gait down stairs step over step, then 1.5 laps around the building working on knee extension and step length       Knee/Hip Exercises: Stretches   Press photographer  Both;3 reps;20 seconds      Knee/Hip Exercises: Aerobic   Elliptical  I=10, R=5 x 5 minutes      Knee/Hip Exercises: Machines for Strengthening   Cybex Knee Extension  10# 3x10    Cybex Knee Flexion  25# 3x10    Total Gym Leg Press  20# 2x10, single leg 30# x10, both legs no weight working on flexion      Knee/Hip Exercises: Standing   Other Standing Knee Exercises  lunge stretch      Knee/Hip Exercises: Supine   Heel Prop for Knee Extension  3 minutes;Weight    Heel Prop for Knee Extension Weight (lbs)  12#      Electrical Stimulation   Electrical Stimulation Location  right medial knee and thigh    Electrical Stimulation Action  IFC    Electrical Stimulation Parameters  supine    Electrical Stimulation Goals  Pain      Manual Therapy   Manual Therapy  Joint mobilization;Soft tissue mobilization;Passive ROM    Joint Mobilization  some gentle joint  mobs with some gentle passive stretch    Soft tissue mobilization  to the quad , ITB area    Passive ROM  flexion and extension               PT Short Term Goals - 04/22/19 1441      PT SHORT TERM GOAL #1   Title  indepdndent with initial HEP    Status  Achieved        PT Long Term Goals - 05/31/19 1008      PT LONG TERM GOAL #1   Title  understand RICE    Status  Achieved      PT LONG TERM GOAL #2   Title  walk with a better gait, demonstrating a natural knee bend    Status  Achieved      PT LONG TERM GOAL #3   Title  increase AROM to 5-115 degrees flexion    Status  Partially Met      PT LONG TERM GOAL #4   Title  decrease pain 50%    Status  Achieved      PT LONG TERM GOAL #5   Title  no episodes of knee "locking" over a 4 week period    Status  Achieved            Plan - 06/07/19 1254    Clinical Impression Statement  Patient really shuffling heels when walking due to not extending knees fully when walking.  She still had some  difficulty with this with cues and having her listen to the scuff of the heels.  Worked a little more on extnesion today    PT Next Visit Plan  possible D/C next visit    Consulted and Agree with Plan of Care  Patient       Patient will benefit from skilled therapeutic intervention in order to improve the following deficits and impairments:  Abnormal gait, Decreased mobility, Pain, Increased muscle spasms, Decreased strength, Decreased range of motion, Decreased endurance, Decreased activity tolerance, Decreased balance, Difficulty walking, Impaired flexibility  Visit Diagnosis: Acute pain of right knee  Stiffness of right knee, not elsewhere classified  Difficulty in walking, not elsewhere classified  Localized edema     Problem List Patient Active Problem List   Diagnosis Date Noted  . S/P total knee replacement 02/23/2018  . Essential hypertension 08/08/2015  . Osteoporosis 06/15/2014    Sumner Boast., PT 06/07/2019, 12:59 PM  [place] [address] [place], [place], [address] Phone: [phone]   Fax:  [phone]  Name: Lisa Wheeler MRN: [id number] Date of Birth: [birth year]

## 2019-06-10 ENCOUNTER — Other Ambulatory Visit: Payer: Self-pay

## 2019-06-10 ENCOUNTER — Encounter: Payer: Self-pay | Admitting: Physical Therapy

## 2019-06-10 ENCOUNTER — Ambulatory Visit: Payer: Medicare Other | Admitting: Physical Therapy

## 2019-06-10 DIAGNOSIS — R262 Difficulty in walking, not elsewhere classified: Secondary | ICD-10-CM

## 2019-06-10 DIAGNOSIS — M25561 Pain in right knee: Secondary | ICD-10-CM | POA: Diagnosis not present

## 2019-06-10 DIAGNOSIS — M25661 Stiffness of right knee, not elsewhere classified: Secondary | ICD-10-CM

## 2019-06-10 NOTE — Therapy (Signed)
[place], [place], [address] Phone: [phone]   Fax:  [phone]  Physical Therapy Treatment  Patient Details  Name: Lisa Wheeler MRN: [id number] Date of Birth: [birth year] Referring Provider (PT): Lucey   Encounter Date: 06/10/2019  PT End of Session - 06/10/19 1146    Visit Number  17    PT Start Time  1010    PT Stop Time  1100    PT Time Calculation (min)  50 min    Activity Tolerance  Patient tolerated treatment well    Behavior During Therapy  [facility] for tasks assessed/performed       Past Medical History:  Diagnosis Date  . Arthritis   . Elevated lipids   . Hypertension   . Osteoarthritis of right knee    Severe  . Osteopenia    right hip    Past Surgical History:  Procedure Laterality Date  . CLOSED REDUCTION HAND FRACTURE    . OOPHORECTOMY     right  . TOTAL KNEE ARTHROPLASTY Right 02/23/2018   Procedure: TOTAL KNEE ARTHROPLASTY;  Surgeon: Vickey Huger, MD;  Location: [place];  Service: Orthopedics;  Laterality: Right;  . TOTAL KNEE ARTHROPLASTY Left 06/01/2018   Procedure: LEFT TOTAL KNEE ARTHROPLASTY;  Surgeon: Vickey Huger, MD;  [facility];  Service: Orthopedics;  Laterality: Left;  Adductor Block  . TUBAL LIGATION  1985  . WRIST SURGERY  2/12    There were no vitals filed for this visit.  Subjective Assessment - 06/10/19 1014    Subjective  I am still doing well, no problems    Currently in Pain?  Yes    Pain Score  1     Pain Location  Knee    Pain Orientation  Right    Pain Relieving Factors  the treatment really helped me                       [facility] Adult PT Treatment/Exercise - 06/10/19 0001      Ambulation/Gait   Gait Comments  gait 2 laps around building and she was doing the bigger step lengths and doing correct heel strike without cues today      Knee/Hip Exercises: Stretches   Gastroc Stretch  Both;3 reps;20 seconds      Knee/Hip Exercises: Aerobic   Elliptical  I=10, R=5 x 5 minutes      Knee/Hip Exercises: Machines for Strengthening   Total Gym Leg Press  20# 2x10, single leg 30# x10, both legs no weight working on flexion      Knee/Hip Exercises: Standing   Other Standing Knee Exercises  lunge stretch      Manual Therapy   Manual Therapy  Joint mobilization;Soft tissue mobilization;Passive ROM    Joint Mobilization  some gentle joint mobs with some gentle passive stretch    Soft tissue mobilization  to the quad , ITB area    Passive ROM  flexion and extension             PT Education - 06/10/19 1057    Education Details  went over her doing stretches on own afdter D/C    Person(s) Educated  Patient    Methods  Explanation;Demonstration;Tactile cues;Verbal cues;Handout    Comprehension  Verbalized understanding;Returned demonstration;Verbal cues required       PT Short Term Goals - 04/22/19 1441      PT SHORT TERM GOAL #1  Title  indepdndent with initial HEP    Status  Achieved        PT Long Term Goals - 06/10/19 1214      PT LONG TERM GOAL #1   Title  understand RICE    Status  Achieved      PT LONG TERM GOAL #2   Title  walk with a better gait, demonstrating a natural knee bend    Status  Achieved      PT LONG TERM GOAL #3   Title  increase AROM to 5-115 degrees flexion    Status  Achieved      PT LONG TERM GOAL #4   Title  decrease pain 50%    Status  Achieved      PT LONG TERM GOAL #5   Title  no episodes of knee "locking" over a 4 week period    Status  Achieved            Plan - 06/10/19 1212    Clinical Impression Statement  Patient has done very well, she has had no instances of locking, her ROM is much improved and her gait is much better, she still has tightness in the knees and does not have full ROM.  She is doing some exercises and we went over safety and how to maintain her ROM on her own    PT Next Visit Plan  d/c goals met    Consulted and  Agree with Plan of Care  Patient       Patient will benefit from skilled therapeutic intervention in order to improve the following deficits and impairments:  Abnormal gait, Decreased mobility, Pain, Increased muscle spasms, Decreased strength, Decreased range of motion, Decreased endurance, Decreased activity tolerance, Decreased balance, Difficulty walking, Impaired flexibility  Visit Diagnosis: Acute pain of right knee  Stiffness of right knee, not elsewhere classified  Difficulty in walking, not elsewhere classified     Problem List Patient Active Problem List   Diagnosis Date Noted  . S/P total knee replacement 02/23/2018  . Essential hypertension 08/08/2015  . Osteoporosis 06/15/2014    Sumner Boast., PT 06/10/2019, 12:15 PM  [place] [address] [place], [place], [address] Phone: [phone]   Fax:  [phone]  Name: Lisa Wheeler MRN: [id number] Date of Birth: 12/18/1950

## 2019-07-12 ENCOUNTER — Other Ambulatory Visit: Payer: Self-pay | Admitting: Obstetrics & Gynecology

## 2019-07-12 DIAGNOSIS — Z1231 Encounter for screening mammogram for malignant neoplasm of breast: Secondary | ICD-10-CM

## 2019-08-16 ENCOUNTER — Ambulatory Visit
Admission: RE | Admit: 2019-08-16 | Discharge: 2019-08-16 | Disposition: A | Discharge status: Home / Self Care | Payer: Medicare Other | Source: Ambulatory Visit | Attending: Obstetrics & Gynecology | Admitting: Obstetrics & Gynecology

## 2019-08-16 ENCOUNTER — Other Ambulatory Visit: Payer: Self-pay

## 2019-08-16 DIAGNOSIS — Z1231 Encounter for screening mammogram for malignant neoplasm of breast: Secondary | ICD-10-CM

## 2019-10-02 IMAGING — MG DIGITAL SCREENING BILATERAL MAMMOGRAM WITH CAD
4 series · 4 of 4 positions shown · non-contrast
Comparison: Previous exam(s).

CLINICAL DATA: Screening.

EXAM:
DIGITAL SCREENING BILATERAL MAMMOGRAM WITH CAD

[L MLO]
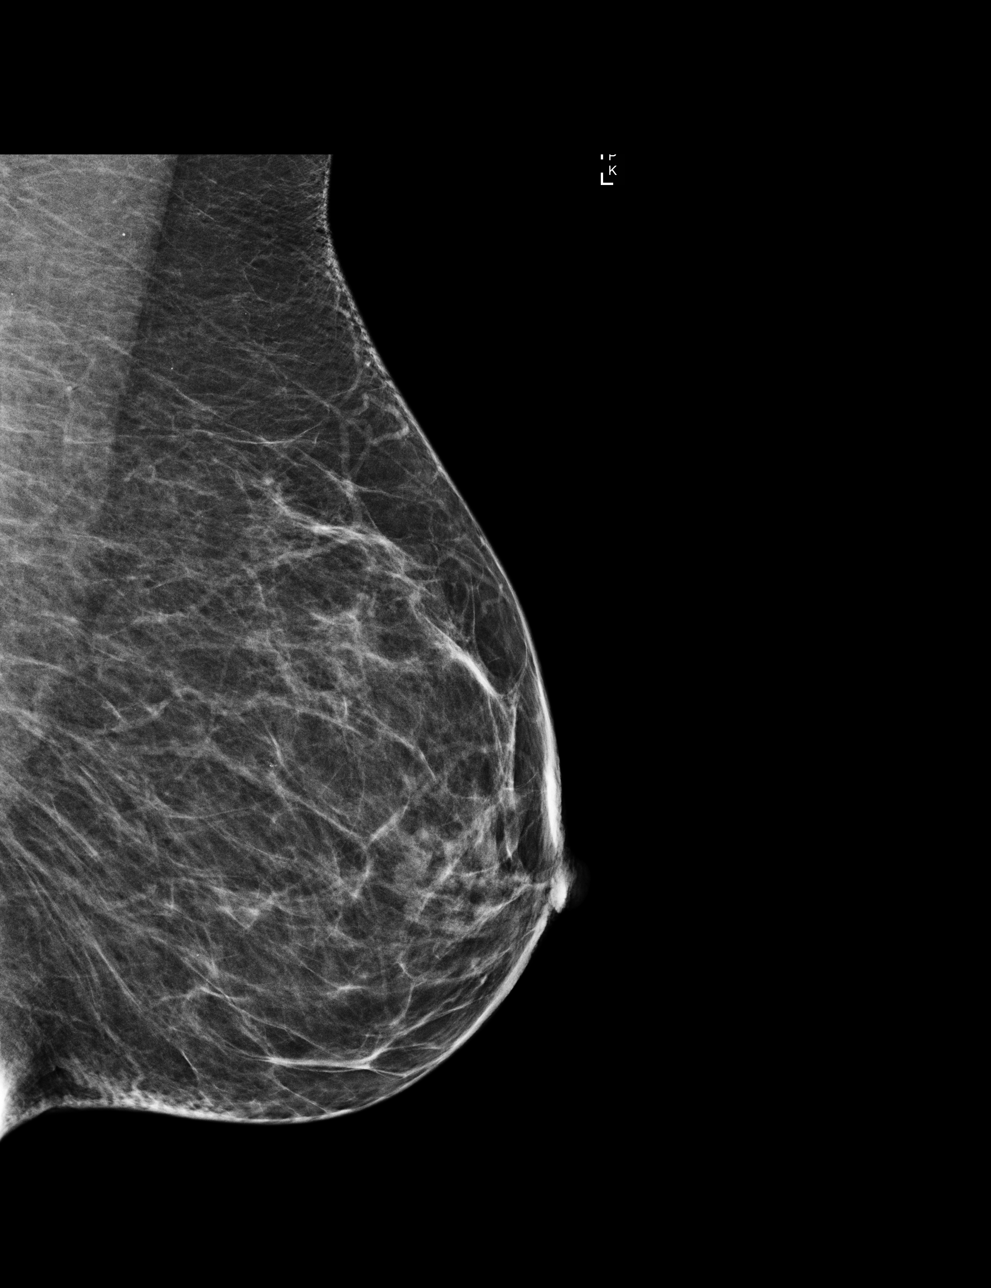

[R CC]
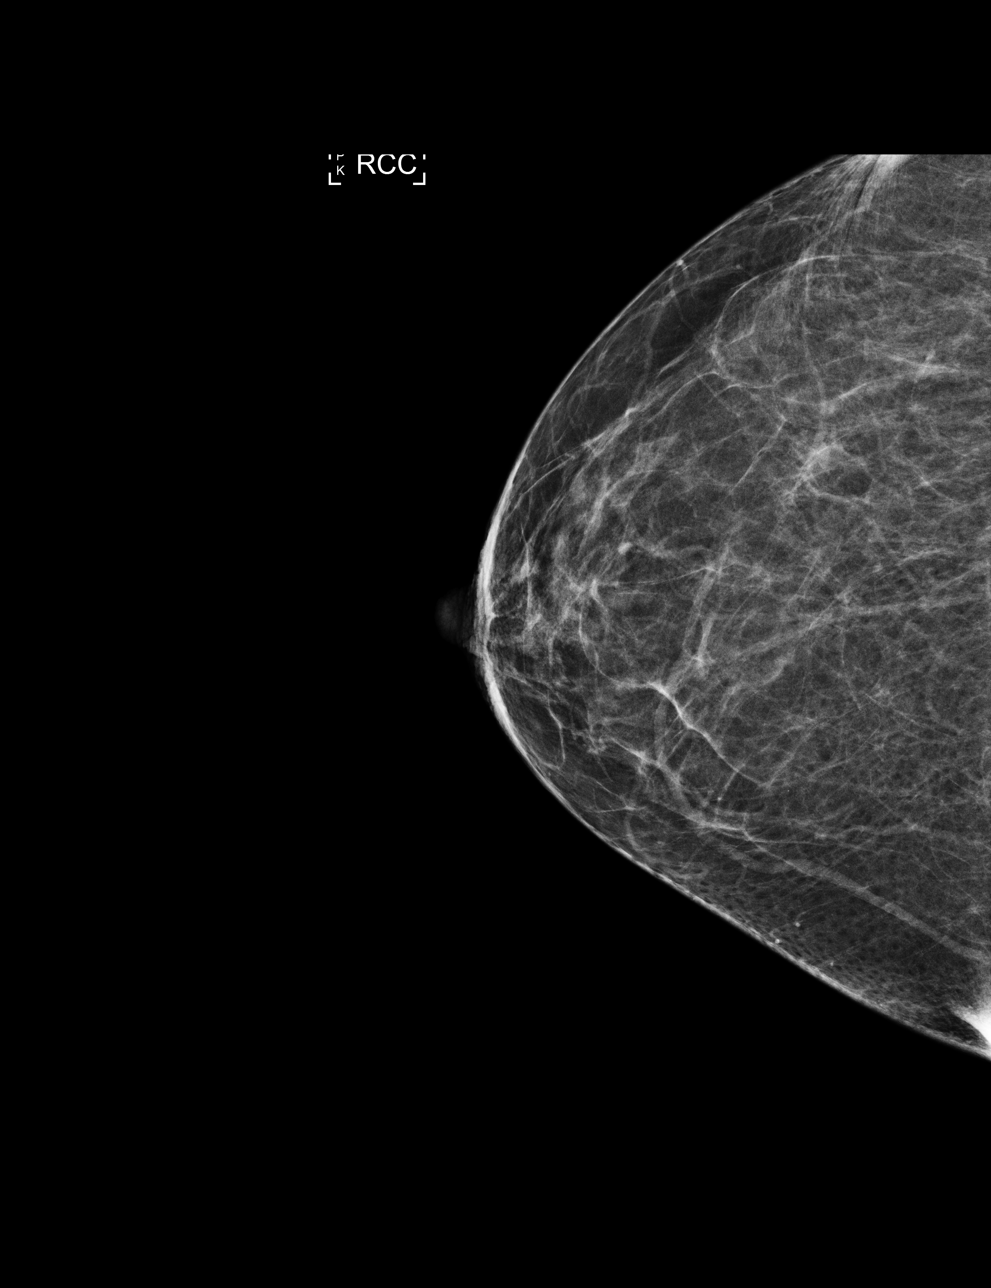

[R MLO]
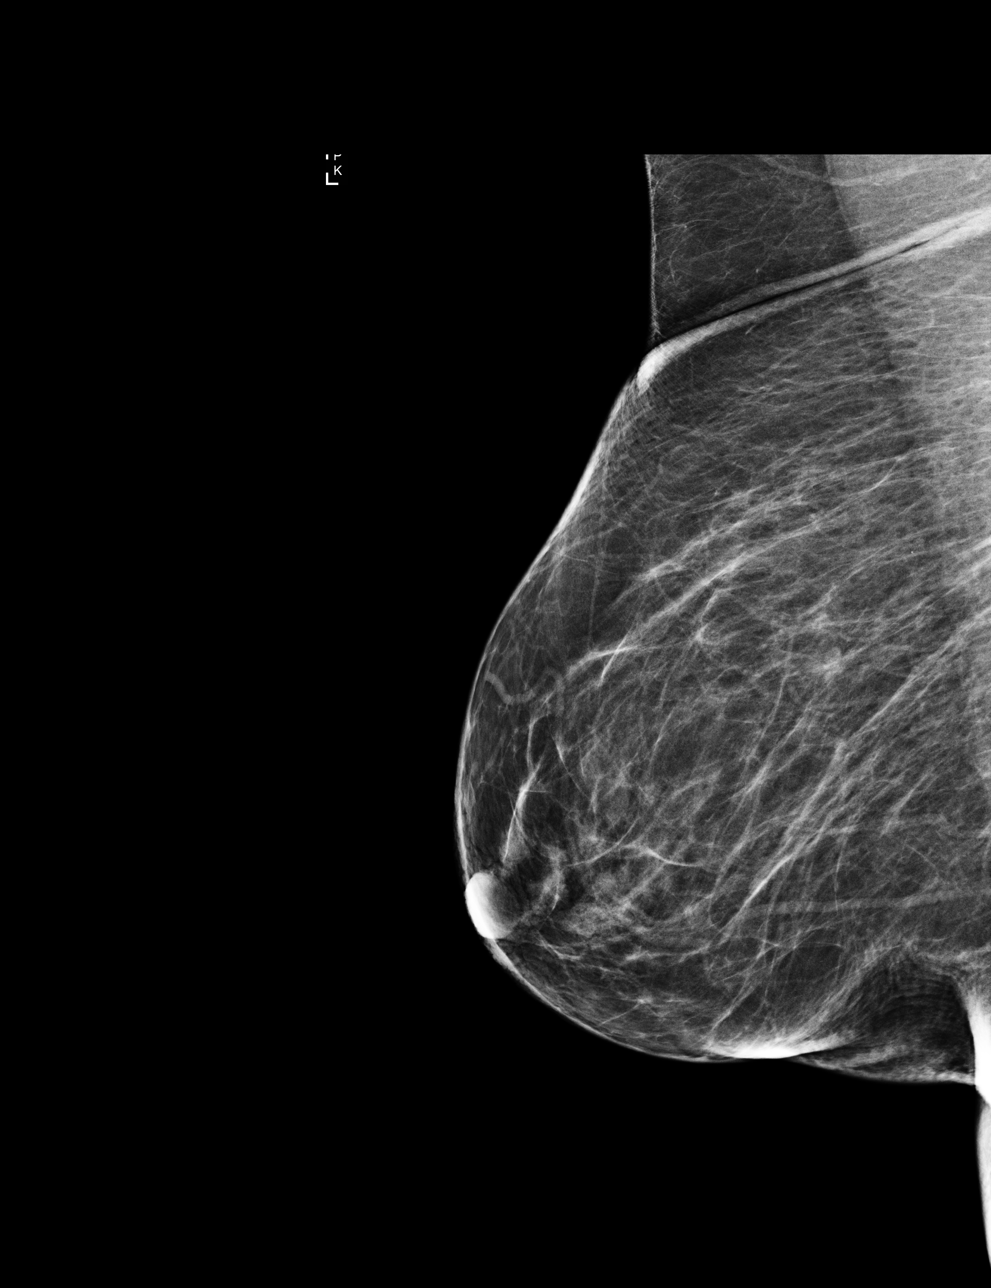

[L CC]
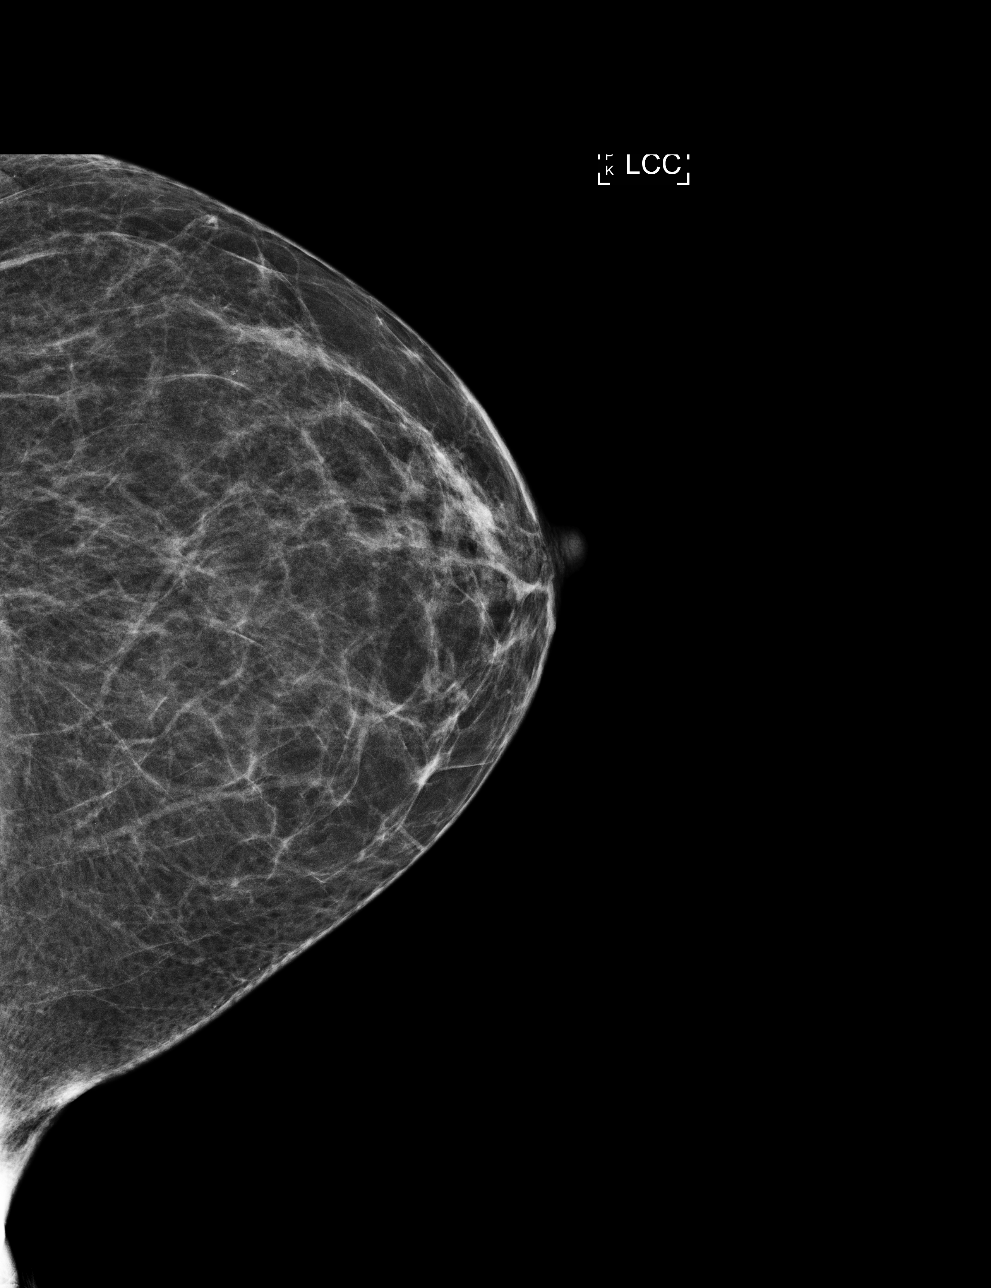

[4 of 4 positions shown; findings below may reference images not displayed]

ACR Breast Density Category b: There are scattered areas of
fibroglandular density.
FINDINGS: There are no findings suspicious for malignancy. Images were
processed with CAD.
IMPRESSION: No mammographic evidence of malignancy. A result letter of this
screening mammogram will be mailed directly to the patient.

RECOMMENDATION:
Screening mammogram in one year. (Code:AS-G-LCT)

BI-RADS CATEGORY  1: Negative.

## 2020-01-24 ENCOUNTER — Other Ambulatory Visit: Payer: Self-pay

## 2020-01-24 ENCOUNTER — Encounter: Payer: Self-pay | Admitting: Physical Therapy

## 2020-01-24 ENCOUNTER — Ambulatory Visit: Payer: Medicare Other | Attending: Orthopedic Surgery | Admitting: Physical Therapy

## 2020-01-24 DIAGNOSIS — M25561 Pain in right knee: Secondary | ICD-10-CM

## 2020-01-24 DIAGNOSIS — R262 Difficulty in walking, not elsewhere classified: Secondary | ICD-10-CM | POA: Diagnosis present

## 2020-01-24 DIAGNOSIS — R600 Localized edema: Secondary | ICD-10-CM

## 2020-01-24 DIAGNOSIS — M25661 Stiffness of right knee, not elsewhere classified: Secondary | ICD-10-CM

## 2020-01-24 NOTE — Therapy (Signed)
[facility] [place] [address] [place], [place], [address] Phone: [phone]   Fax:  [phone]  Physical Therapy Evaluation  Patient Details  Name: Lisa Wheeler MRN: [id number] Date of Birth: 04/25/1951 Referring Provider (PT): Lucey   Encounter Date: 01/24/2020  PT End of Session - 01/24/20 1657    Visit Number  1    Date for PT Re-Evaluation  03/25/20    PT Start Time  1605    PT Stop Time  1700    PT Time Calculation (min)  55 min    Activity Tolerance  Patient tolerated treatment well    Behavior During Therapy  [facility] for tasks assessed/performed       Past Medical History:  Diagnosis Date  . Arthritis   . Elevated lipids   . Hypertension   . Osteoarthritis of right knee    Severe  . Osteopenia    right hip    Past Surgical History:  Procedure Laterality Date  . CLOSED REDUCTION HAND FRACTURE    . OOPHORECTOMY     right  . TOTAL KNEE ARTHROPLASTY Right 02/23/2018   Procedure: TOTAL KNEE ARTHROPLASTY;  Surgeon: Dannielle Huh, MD;  [facility];  Service: Orthopedics;  Laterality: Right;  . TOTAL KNEE ARTHROPLASTY Left 06/01/2018   Procedure: LEFT TOTAL KNEE ARTHROPLASTY;  Surgeon: Dannielle Huh, MD;  [facility]: [facility];  Service: Orthopedics;  Laterality: Left;  Adductor Block  . TUBAL LIGATION  1985  . WRIST SURGERY  2/12    There were no vitals filed for this visit.   Subjective Assessment - 01/24/20 1606    Subjective  Patient had a right TKA on February 23, 2018, she recovered wel without much issues In April 2020 she had spontaneous knee pain that continued with "locking up".  We saw her starting early august 2020  The  MD felt like she may have a partial tear of a tendon.  She got better over about 2 months.  She reports that she was doing fine until April 2nd and she woke up with pain and swelling, recent x-rays and blood work were negative, MD referred her back to PT    Limitations   Lifting;Standing;Walking;House hold activities    How long can you stand comfortably?  10 minutes    Patient Stated Goals  have less pain, sleep better    Currently in Pain?  Yes    Pain Score  1     Pain Location  Knee    Pain Orientation  Right    Pain Descriptors / Indicators  Aching    Pain Type  Acute pain    Pain Onset  More than a month ago    Pain Frequency  Intermittent    Aggravating Factors   bending, walking, standing pain can be up to 10/10    Pain Relieving Factors  rest pain can be 0/10    Effect of Pain on Daily Activities  difficulty sleeping, walking and standing         [facility] PT Assessment - 01/24/20 0001      Assessment   Medical Diagnosis  right knee pain    Referring Provider (PT)  Lucey    Onset Date/Surgical Date  12/03/19    Prior Therapy  a year ago      Precautions   Precautions  None      Balance Screen   Has the patient fallen in the past 6 months  No    Has the patient had a decrease in activity level because of a fear of falling?   No    Is the patient reluctant to leave their home because of a fear of falling?   No      Home Environment   Additional Comments  has stairs, housework      Prior Function   Level of Independence  Independent    Vocation  Retired    Leisure  rides a stationary bike  up to an hour a day, walking 2 miles prior to April 2nbd      Observation/Other Assessments-Edema    Edema  Circumferential      Circumferential Edema   Circumferential - Right  49 cm    Circumferential - Left   47 cm      ROM / Strength   AROM / PROM / Strength  AROM;PROM;Strength      AROM   AROM Assessment Site  Knee    Right/Left Knee  Right    Right Knee Extension  25    Right Knee Flexion  83      PROM   PROM Assessment Site  Knee    Right/Left Knee  Right    Right Knee Extension  13    Right Knee Flexion  90      Strength   Overall Strength Comments  4-/5 a little apprehensive with this      Palpation   Palpation comment   mild tightness and tenderness lateral, superior and medial patella, the ITB feels tight      Ambulation/Gait   Gait Comments  no device, has a slight hip hike at toe off because she does not bend the knee, walks with a stiff legged gait                  Objective measurements completed on examination: See above findings.      [facility] Adult PT Treatment/Exercise - 01/24/20 0001      Manual Therapy   Manual Therapy  Soft tissue mobilization    Soft tissue mobilization  to the quad , ITB area               PT Short Term Goals - 01/24/20 1736      PT SHORT TERM GOAL #1   Title  indepdndent with initial HEP    Time  2    Period  Weeks    Status  New        PT Long Term Goals - 01/24/20 1736      PT LONG TERM GOAL #1   Title  go up and down stairs step over step    Time  8    Period  Weeks    Status  New      PT LONG TERM GOAL #2   Title  walk with a better gait, demonstrating a natural knee bend    Time  8    Period  Weeks    Status  New      PT LONG TERM GOAL #3   Title  increase AROM to 5-115 degrees flexion    Time  8    Period  Weeks    Status  New      PT LONG TERM GOAL #5   Title  no episodes of knee "locking" over a 4 week period    Time  8    Period  Weeks    Status  New             Plan - 01/24/20 1657    Clinical Impression Statement  Patient had a right TKA in 2019, she did well and then had some complications in April 2020, she reports that it would lock on her, she had PT here last August and the ROM improved and her pain was gone, she reports that about 6-7 weeks ago she had some of the same pains and "locking" she is unsure of a cause.  Her ROM is much worse, she is walking with a very stiff leg,  She is very tight in the ITB and the peripatellar area.  REcent x-rays were negative, she was riding an exercise bike up to an hour a day until this April    Stability/Clinical Decision Making  Evolving/Moderate complexity     Clinical Decision Making  Low    Rehab Potential  Good    PT Frequency  2x / week    PT Duration  8 weeks    PT Treatment/Interventions  ADLs/Self Care Home Management;Cryotherapy;Electrical Stimulation;Moist Heat;Ultrasound;Iontophoresis 4mg /ml Dexamethasone;Gait training;Stair training;Functional mobility training;Therapeutic activities;Therapeutic exercise;Balance training;Neuromuscular re-education;Manual techniques;Patient/family education;Vasopneumatic Device    PT Next Visit Plan  slowly start exercises, she responed very well last year with modalities and stretches    Consulted and Agree with Plan of Care  Patient       Patient will benefit from skilled therapeutic intervention in order to improve the following deficits and impairments:  Abnormal gait, Decreased range of motion, Difficulty walking, Increased muscle spasms, Pain, Impaired flexibility, Decreased balance, Decreased mobility, Decreased strength, Increased edema  Visit Diagnosis: Acute pain of right knee - Plan: PT plan of care cert/re-cert  Stiffness of right knee, not elsewhere classified - Plan: PT plan of care cert/re-cert  Difficulty in walking, not elsewhere classified - Plan: PT plan of care cert/re-cert  Localized edema - Plan: PT plan of care cert/re-cert     Problem List Patient Active Problem List   Diagnosis Date Noted  . S/P total knee replacement 02/23/2018  . Essential hypertension 08/08/2015  . Osteoporosis 06/15/2014    06/17/2014., PT 01/24/2020, 5:40 PM  [facility] [place] [facility] [address] [place], [place], [place] Phone: [phone]   Fax:  [phone]  Name: Lisa Wheeler MRN: [name] Date of Birth: 14-Nov-1950

## 2020-01-27 ENCOUNTER — Encounter: Payer: Self-pay | Admitting: Physical Therapy

## 2020-01-27 ENCOUNTER — Ambulatory Visit: Payer: Medicare Other | Admitting: Physical Therapy

## 2020-01-27 ENCOUNTER — Other Ambulatory Visit: Payer: Self-pay

## 2020-01-27 DIAGNOSIS — R600 Localized edema: Secondary | ICD-10-CM

## 2020-01-27 DIAGNOSIS — R262 Difficulty in walking, not elsewhere classified: Secondary | ICD-10-CM

## 2020-01-27 DIAGNOSIS — M25561 Pain in right knee: Secondary | ICD-10-CM

## 2020-01-27 DIAGNOSIS — M25661 Stiffness of right knee, not elsewhere classified: Secondary | ICD-10-CM

## 2020-01-27 NOTE — Therapy (Signed)
[place], [place], [address] Phone: [phone]   Fax:  [phone]  Physical Therapy Treatment  Patient Details  Name: Lisa Wheeler MRN: [id number] Date of Birth: 05-07-1951 Referring Provider (PT): Lisa Wheeler   Encounter Date: 01/27/2020  PT End of Session - 01/27/20 1633    Visit Number  2    Date for PT Re-Evaluation  03/25/20    PT Start Time  1350    PT Stop Time  1446    PT Time Calculation (min)  56 min    Activity Tolerance  Patient tolerated treatment well    Behavior During Therapy  [facility] for tasks assessed/performed       Past Medical History:  Diagnosis Date  . Arthritis   . Elevated lipids   . Hypertension   . Osteoarthritis of right knee    Severe  . Osteopenia    right hip    Past Surgical History:  Procedure Laterality Date  . CLOSED REDUCTION HAND FRACTURE    . OOPHORECTOMY     right  . TOTAL KNEE ARTHROPLASTY Right 02/23/2018   Procedure: TOTAL KNEE ARTHROPLASTY;  Surgeon: Lisa Huger, MD;  Location: [place];  Service: Orthopedics;  Laterality: Right;  . TOTAL KNEE ARTHROPLASTY Left 06/01/2018   Procedure: LEFT TOTAL KNEE ARTHROPLASTY;  Surgeon: Lisa Huger, MD;  [facility];  Service: Orthopedics;  Laterality: Left;  Adductor Block  . TUBAL LIGATION  1985  . WRIST SURGERY  2/12    There were no vitals filed for this visit.  Subjective Assessment - 01/27/20 1408    Subjective  Patient reports that she is about the same.    Currently in Pain?  Yes    Pain Score  2     Pain Location  Knee    Pain Descriptors / Indicators  Aching                        [facility] Adult PT Treatment/Exercise - 01/27/20 0001      Exercises   Exercises  Knee/Hip      Knee/Hip Exercises: Stretches   Gastroc Stretch  Both;3 reps;20 seconds    Gastroc Stretch Limitations  some PT overpressure      Knee/Hip Exercises: Aerobic   Elliptical  I=8, R=5 x 2 minutes     Nustep  level 3 x 5 minutes      Knee/Hip Exercises: Machines for Strengthening   Cybex Knee Extension  5# 2x20    Cybex Knee Flexion  20# 2x10    Cybex Leg Press  20# 2x10 small ROM, then no weitght just working on the ROM      Knee/Hip Exercises: Supine   Short Arc [employer] Sets  Right;2 sets;10 reps    Short Arc Quad Sets Limitations  2.5#      Modalities   Modalities  Electrical Stimulation;Moist Heat      Moist Heat Therapy   Number Minutes Moist Heat  10 Minutes    Moist Heat Location  Knee      Electrical Stimulation   Electrical Stimulation Location  right knee    Electrical Stimulation Action  IFC    Electrical Stimulation Parameters  supine    Electrical Stimulation Goals  Pain      Manual Therapy   Manual Therapy  Soft tissue mobilization    Soft tissue mobilization  to the quad , ITB area  PT Short Term Goals - 01/24/20 1736      PT SHORT TERM GOAL #1   Title  indepdndent with initial HEP    Time  2    Period  Weeks    Status  New        PT Long Term Goals - 01/24/20 1736      PT LONG TERM GOAL #1   Title  go up and down stairs step over step    Time  8    Period  Weeks    Status  New      PT LONG TERM GOAL #2   Title  walk with a better gait, demonstrating a natural knee bend    Time  8    Period  Weeks    Status  New      PT LONG TERM GOAL #3   Title  increase AROM to 5-115 degrees flexion    Time  8    Period  Weeks    Status  New      PT LONG TERM GOAL #5   Title  no episodes of knee "locking" over a 4 week period    Time  8    Period  Weeks    Status  New            Plan - 01/27/20 1634    Clinical Impression Statement  Paitent only c/o pain with stretching into flexion on leg press, knee extension and with passive motions.  tolerated other activities without increase of pain, cues needed to try to get more ROM on the exercises    PT Next Visit Plan  slowly start exercises, she responed very well last  year with modalities and stretches    Consulted and Agree with Plan of Care  Patient       Patient will benefit from skilled therapeutic intervention in order to improve the following deficits and impairments:  Abnormal gait, Decreased range of motion, Difficulty walking, Increased muscle spasms, Pain, Impaired flexibility, Decreased balance, Decreased mobility, Decreased strength, Increased edema  Visit Diagnosis: Acute pain of right knee  Stiffness of right knee, not elsewhere classified  Difficulty in walking, not elsewhere classified  Localized edema     Problem List Patient Active Problem List   Diagnosis Date Noted  . S/P total knee replacement 02/23/2018  . Essential hypertension 08/08/2015  . Osteoporosis 06/15/2014    Lisa Wheeler., PT 01/27/2020, 4:35 PM  [facility] [place] [facility] [address] [place], [place], [address] Phone: [phone]   Fax:  [phone]  Name: Lisa Wheeler MRN: [id number] Date of Birth: October 19, 1950

## 2020-02-01 ENCOUNTER — Other Ambulatory Visit: Payer: Self-pay

## 2020-02-01 ENCOUNTER — Ambulatory Visit: Payer: Medicare Other | Attending: Orthopedic Surgery | Admitting: Physical Therapy

## 2020-02-01 ENCOUNTER — Encounter: Payer: Self-pay | Admitting: Physical Therapy

## 2020-02-01 DIAGNOSIS — M25561 Pain in right knee: Secondary | ICD-10-CM | POA: Insufficient documentation

## 2020-02-01 DIAGNOSIS — R262 Difficulty in walking, not elsewhere classified: Secondary | ICD-10-CM | POA: Diagnosis present

## 2020-02-01 DIAGNOSIS — R600 Localized edema: Secondary | ICD-10-CM | POA: Diagnosis present

## 2020-02-01 DIAGNOSIS — M25661 Stiffness of right knee, not elsewhere classified: Secondary | ICD-10-CM | POA: Diagnosis present

## 2020-02-01 NOTE — Therapy (Signed)
[place], [place], [address] Phone: [phone]   Fax:  [phone]  Physical Therapy Treatment  Patient Details  Name: Lisa Wheeler MRN: [id number] Date of Birth: 1951/06/18 Referring Provider (PT): Lucey   Encounter Date: 02/01/2020  PT End of Session - 02/01/20 1440    Visit Number  3    Date for PT Re-Evaluation  03/25/20    PT Start Time  6962    PT Stop Time  1451    PT Time Calculation (min)  56 min    Activity Tolerance  Patient tolerated treatment well    Behavior During Therapy  [facility] for tasks assessed/performed       Past Medical History:  Diagnosis Date  . Arthritis   . Elevated lipids   . Hypertension   . Osteoarthritis of right knee    Severe  . Osteopenia    right hip    Past Surgical History:  Procedure Laterality Date  . CLOSED REDUCTION HAND FRACTURE    . OOPHORECTOMY     right  . TOTAL KNEE ARTHROPLASTY Right 02/23/2018   Procedure: TOTAL KNEE ARTHROPLASTY;  Surgeon: Vickey Huger, MD;  Location: [place];  Service: Orthopedics;  Laterality: Right;  . TOTAL KNEE ARTHROPLASTY Left 06/01/2018   Procedure: LEFT TOTAL KNEE ARTHROPLASTY;  Surgeon: Vickey Huger, MD;  [facility];  Service: Orthopedics;  Laterality: Left;  Adductor Block  . TUBAL LIGATION  1985  . WRIST SURGERY  2/12    There were no vitals filed for this visit.  Subjective Assessment - 02/01/20 1355    Subjective  Patient reports that she felt better after the last treatment, still hurts with any weight bearing.    Currently in Pain?  Yes    Pain Score  3     Pain Location  Knee    Pain Orientation  Right;Medial    Pain Descriptors / Indicators  Aching;Sore    Aggravating Factors   bedning, weight bearing                        [facility] Adult PT Treatment/Exercise - 02/01/20 0001      Knee/Hip Exercises: Aerobic   Elliptical  I=8, R=5 x 3 minutes    Nustep  level 4 x 5  minutes      Knee/Hip Exercises: Machines for Strengthening   Cybex Knee Extension  5# 2x20    Cybex Knee Flexion  20# 2x10    Cybex Leg Press  20# 2x10 small ROM, then no weitght just working on the ROM      Modalities   Modalities  Electrical Stimulation;Moist Heat      Moist Heat Therapy   Number Minutes Moist Heat  10 Minutes    Moist Heat Location  Knee      Electrical Stimulation   Electrical Stimulation Location  right knee    Electrical Stimulation Action  IFC    Electrical Stimulation Parameters  supine    Electrical Stimulation Goals  Pain      Manual Therapy   Manual Therapy  Soft tissue mobilization;Passive ROM    Soft tissue mobilization  to the quad , ITB area    Passive ROM  passive motion to flexion and extension               PT Short Term Goals - 01/24/20 1736      PT SHORT  TERM GOAL #1   Title  indepdndent with initial HEP    Time  2    Period  Weeks    Status  New        PT Long Term Goals - 01/24/20 1736      PT LONG TERM GOAL #1   Title  go up and down stairs step over step    Time  8    Period  Weeks    Status  New      PT LONG TERM GOAL #2   Title  walk with a better gait, demonstrating a natural knee bend    Time  8    Period  Weeks    Status  New      PT LONG TERM GOAL #3   Title  increase AROM to 5-115 degrees flexion    Time  8    Period  Weeks    Status  New      PT LONG TERM GOAL #5   Title  no episodes of knee "locking" over a 4 week period    Time  8    Period  Weeks    Status  New            Plan - 02/01/20 1441    Clinical Impression Statement  Patient tolerated adding exercises, she is having pain medially today, and last week it was more laterally.  The knee is very stiff for both flexiona nd extension, cues needed to focus on TKE for exercises    PT Next Visit Plan  slowly start exercises, she responed very well last year with modalities and stretches    Consulted and Agree with Plan of Care   Patient       Patient will benefit from skilled therapeutic intervention in order to improve the following deficits and impairments:  Abnormal gait, Decreased range of motion, Difficulty walking, Increased muscle spasms, Pain, Impaired flexibility, Decreased balance, Decreased mobility, Decreased strength, Increased edema  Visit Diagnosis: Acute pain of right knee  Stiffness of right knee, not elsewhere classified  Difficulty in walking, not elsewhere classified  Localized edema     Problem List Patient Active Problem List   Diagnosis Date Noted  . S/P total knee replacement 02/23/2018  . Essential hypertension 08/08/2015  . Osteoporosis 06/15/2014    Jearld Lesch., PT 02/01/2020, 2:43 PM  [facility] [place] [facility] [address] [place], [place], [address] Phone: [phone]   Fax:  [phone]  Name: Lisa Wheeler MRN: [id number] Date of Birth: 11/27/1950

## 2020-02-03 ENCOUNTER — Encounter: Payer: Medicare Other | Admitting: Physical Therapy

## 2020-02-04 ENCOUNTER — Encounter: Payer: Self-pay | Admitting: Physical Therapy

## 2020-02-04 ENCOUNTER — Other Ambulatory Visit: Payer: Self-pay

## 2020-02-04 ENCOUNTER — Ambulatory Visit: Payer: Medicare Other | Admitting: Physical Therapy

## 2020-02-04 DIAGNOSIS — M25561 Pain in right knee: Secondary | ICD-10-CM

## 2020-02-04 DIAGNOSIS — R262 Difficulty in walking, not elsewhere classified: Secondary | ICD-10-CM

## 2020-02-04 DIAGNOSIS — R600 Localized edema: Secondary | ICD-10-CM

## 2020-02-04 DIAGNOSIS — M25661 Stiffness of right knee, not elsewhere classified: Secondary | ICD-10-CM

## 2020-02-04 NOTE — Therapy (Signed)
[place], [place], [address] Phone: [phone]   Fax:  [phone]  Physical Therapy Treatment  Patient Details  Name: Lisa Wheeler MRN: [id number] Date of Birth: 1950-10-19 Referring Provider (PT): Lucey   Encounter Date: 02/04/2020  PT End of Session - 02/04/20 1141    Visit Number  4    Date for PT Re-Evaluation  03/25/20    PT Start Time  1054    PT Stop Time  1150    PT Time Calculation (min)  56 min    Activity Tolerance  Patient tolerated treatment well    Behavior During Therapy  [facility] for tasks assessed/performed       Past Medical History:  Diagnosis Date  . Arthritis   . Elevated lipids   . Hypertension   . Osteoarthritis of right knee    Severe  . Osteopenia    right hip    Past Surgical History:  Procedure Laterality Date  . CLOSED REDUCTION HAND FRACTURE    . OOPHORECTOMY     right  . TOTAL KNEE ARTHROPLASTY Right 02/23/2018   Procedure: TOTAL KNEE ARTHROPLASTY;  Surgeon: Vickey Huger, MD;  Location: [place];  Service: Orthopedics;  Laterality: Right;  . TOTAL KNEE ARTHROPLASTY Left 06/01/2018   Procedure: LEFT TOTAL KNEE ARTHROPLASTY;  Surgeon: Vickey Huger, MD;  [facility];  Service: Orthopedics;  Laterality: Left;  Adductor Block  . TUBAL LIGATION  1985  . WRIST SURGERY  2/12    There were no vitals filed for this visit.  Subjective Assessment - 02/04/20 1056    Subjective  Reports that she was sore after the treatment, but had a really good day yesterday    Currently in Pain?  Yes    Pain Score  3     Pain Location  Knee    Pain Orientation  Right;Medial                        [facility] Adult PT Treatment/Exercise - 02/04/20 0001      Knee/Hip Exercises: Aerobic   Elliptical  I=8, R=5 x 4 minutes    Nustep  level 4 x 5 minutes      Knee/Hip Exercises: Machines for Strengthening   Cybex Knee Extension  5# 2x20    Cybex Knee Flexion   20# 2x10    Cybex Leg Press  20# 2x10 small ROM, then no weitght just working on the ROM      Moist Heat Therapy   Number Minutes Moist Heat  10 Minutes    Moist Heat Location  Knee      Electrical Stimulation   Electrical Stimulation Location  right knee    Electrical Stimulation Action  IFC    Electrical Stimulation Parameters  supine    Electrical Stimulation Goals  Pain      Manual Therapy   Manual Therapy  Soft tissue mobilization;Passive ROM    Soft tissue mobilization  to the quad , ITB area    Passive ROM  passive motion to flexion and extension               PT Short Term Goals - 01/24/20 1736      PT SHORT TERM GOAL #1   Title  indepdndent with initial HEP    Time  2    Period  Weeks    Status  New  PT Long Term Goals - 02/04/20 1142      PT LONG TERM GOAL #1   Title  go up and down stairs step over step    Status  On-going      PT LONG TERM GOAL #2   Title  walk with a better gait, demonstrating a natural knee bend    Status  On-going            Plan - 02/04/20 1141    Clinical Impression Statement  Patient reports that she feels like things are improving, still c/o sitiffness with bending.  She was less sore and tender today.  Walking is looking better with less antalgia    PT Next Visit Plan  continue to push ROM and function but slowly due to set backs at times    Consulted and Agree with Plan of Care  Patient       Patient will benefit from skilled therapeutic intervention in order to improve the following deficits and impairments:  Abnormal gait, Decreased range of motion, Difficulty walking, Increased muscle spasms, Pain, Impaired flexibility, Decreased balance, Decreased mobility, Decreased strength, Increased edema  Visit Diagnosis: Acute pain of right knee  Stiffness of right knee, not elsewhere classified  Difficulty in walking, not elsewhere classified  Localized edema     Problem List Patient Active Problem  List   Diagnosis Date Noted  . S/P total knee replacement 02/23/2018  . Essential hypertension 08/08/2015  . Osteoporosis 06/15/2014    Jearld Lesch., PT 02/04/2020, 11:44 AM  [facility] [place] [address] [place], [place], [address] Phone: [phone]   Fax:  [phone]  Name: Lisa Wheeler MRN: [id number] Date of Birth: 1951/07/25

## 2020-02-07 ENCOUNTER — Encounter: Payer: Self-pay | Admitting: Physical Therapy

## 2020-02-07 ENCOUNTER — Ambulatory Visit: Payer: Medicare Other | Admitting: Physical Therapy

## 2020-02-07 ENCOUNTER — Other Ambulatory Visit: Payer: Self-pay

## 2020-02-07 DIAGNOSIS — M25661 Stiffness of right knee, not elsewhere classified: Secondary | ICD-10-CM

## 2020-02-07 DIAGNOSIS — R600 Localized edema: Secondary | ICD-10-CM

## 2020-02-07 DIAGNOSIS — M25561 Pain in right knee: Secondary | ICD-10-CM

## 2020-02-07 DIAGNOSIS — R262 Difficulty in walking, not elsewhere classified: Secondary | ICD-10-CM

## 2020-02-07 NOTE — Therapy (Signed)
[place], [place], [address] Phone: [phone]   Fax:  [phone]  Physical Therapy Treatment  Patient Details  Name: Lisa Wheeler MRN: [id number] Date of Birth: [birth year] Referring Provider (PT): Lucey   Encounter Date: 02/07/2020  PT End of Session - 02/07/20 1607    Visit Number  5    PT Start Time  8299    PT Stop Time  1455    PT Time Calculation (min)  60 min    Activity Tolerance  Patient tolerated treatment well    Behavior During Therapy  [facility] for tasks assessed/performed       Past Medical History:  Diagnosis Date  . Arthritis   . Elevated lipids   . Hypertension   . Osteoarthritis of right knee    Severe  . Osteopenia    right hip    Past Surgical History:  Procedure Laterality Date  . CLOSED REDUCTION HAND FRACTURE    . OOPHORECTOMY     right  . TOTAL KNEE ARTHROPLASTY Right 02/23/2018   Procedure: TOTAL KNEE ARTHROPLASTY;  Surgeon: Vickey Huger, MD;  Location: [place];  Service: Orthopedics;  Laterality: Right;  . TOTAL KNEE ARTHROPLASTY Left 06/01/2018   Procedure: LEFT TOTAL KNEE ARTHROPLASTY;  Surgeon: Vickey Huger, MD;  [facility];  Service: Orthopedics;  Laterality: Left;  Adductor Block  . TUBAL LIGATION  1985  . WRIST SURGERY  2/12    There were no vitals filed for this visit.  Subjective Assessment - 02/07/20 1356    Subjective  Reports that she had a pretty good weekend, she tried one machine at the gym over the weekend with no adverse reactions, reports swelling as the day goes on    Currently in Pain?  Yes    Pain Location  Knee    Pain Orientation  Right;Lateral    Pain Descriptors / Indicators  Sore    Aggravating Factors   worse as the day goes on                        [facility] Adult PT Treatment/Exercise - 02/07/20 0001      Knee/Hip Exercises: Aerobic   Elliptical  I=8, R=5 x 6 minutes    Recumbent Bike  5 minutes, some  hip hike and pain    Nustep  level 5 x 5 minutes      Knee/Hip Exercises: Machines for Strengthening   Cybex Knee Extension  5# 2x20    Cybex Knee Flexion  20# 2x10    Cybex Leg Press  20# 2x10 small ROM, then no weitght just working on the ROM      Modalities   Modalities  Iontophoresis      Acupuncturist Location  right knee    Electrical Stimulation Action  IFC    Electrical Stimulation Parameters  supine    Electrical Stimulation Goals  Pain      Iontophoresis   Type of Iontophoresis  Dexamethasone    Location  right lateral knee    Dose  22mA    Time  4 hour patch      Manual Therapy   Manual Therapy  Soft tissue mobilization;Passive ROM    Soft tissue mobilization  to the quad , ITB area    Passive ROM  passive motion to flexion and extension  PT Short Term Goals - 01/24/20 1736      PT SHORT TERM GOAL #1   Title  indepdndent with initial HEP    Time  2    Period  Weeks    Status  New        PT Long Term Goals - 02/04/20 1142      PT LONG TERM GOAL #1   Title  go up and down stairs step over step    Status  On-going      PT LONG TERM GOAL #2   Title  walk with a better gait, demonstrating a natural knee bend    Status  On-going            Plan - 02/07/20 1607    Clinical Impression Statement  Patient tolerated me adding the bike and the elliptical, she did not have any increase of pain, she was very painful with PROM into fleixon and extension    PT Next Visit Plan  continue to push ROM and function but slowly due to set backs at times    Consulted and Agree with Plan of Care  Patient       Patient will benefit from skilled therapeutic intervention in order to improve the following deficits and impairments:  Abnormal gait, Decreased range of motion, Difficulty walking, Increased muscle spasms, Pain, Impaired flexibility, Decreased balance, Decreased mobility, Decreased strength, Increased  edema  Visit Diagnosis: Acute pain of right knee  Stiffness of right knee, not elsewhere classified  Difficulty in walking, not elsewhere classified  Localized edema     Problem List Patient Active Problem List   Diagnosis Date Noted  . S/P total knee replacement 02/23/2018  . Essential hypertension 08/08/2015  . Osteoporosis 06/15/2014    Jearld Lesch., PT 02/07/2020, 4:09 PM  [facility] [place] [address] [place], [place], [address] Phone: [phone]   Fax:  [phone]  Name: Lisa Wheeler MRN: [id number] Date of Birth: 1951/07/08

## 2020-02-10 ENCOUNTER — Encounter: Payer: Self-pay | Admitting: Physical Therapy

## 2020-02-10 ENCOUNTER — Other Ambulatory Visit: Payer: Self-pay

## 2020-02-10 ENCOUNTER — Ambulatory Visit: Payer: Medicare Other | Admitting: Physical Therapy

## 2020-02-10 DIAGNOSIS — R600 Localized edema: Secondary | ICD-10-CM

## 2020-02-10 DIAGNOSIS — M25561 Pain in right knee: Secondary | ICD-10-CM

## 2020-02-10 DIAGNOSIS — R262 Difficulty in walking, not elsewhere classified: Secondary | ICD-10-CM

## 2020-02-10 DIAGNOSIS — M25661 Stiffness of right knee, not elsewhere classified: Secondary | ICD-10-CM

## 2020-02-10 NOTE — Therapy (Signed)
[facility] [address] [place], [place], [address] Phone: [phone]   Fax:  [phone]  Physical Therapy Treatment  Patient Details  Name: Lisa Wheeler MRN: [id number] Date of Birth: 06-16-1951 Referring Provider (PT): Lucey   Encounter Date: 02/10/2020   PT End of Session - 02/10/20 1447    Visit Number 6    Date for PT Re-Evaluation 03/25/20    PT Start Time 1357    PT Stop Time 1459    PT Time Calculation (min) 62 min    Activity Tolerance Patient tolerated treatment well    Behavior During Therapy [facility] for tasks assessed/performed           Past Medical History:  Diagnosis Date  . Arthritis   . Elevated lipids   . Hypertension   . Osteoarthritis of right knee    Severe  . Osteopenia    right hip    Past Surgical History:  Procedure Laterality Date  . CLOSED REDUCTION HAND FRACTURE    . OOPHORECTOMY     right  . TOTAL KNEE ARTHROPLASTY Right 02/23/2018   Procedure: TOTAL KNEE ARTHROPLASTY;  Surgeon: Dannielle Huh, MD;  [facility];  Service: Orthopedics;  Laterality: Right;  . TOTAL KNEE ARTHROPLASTY Left 06/01/2018   Procedure: LEFT TOTAL KNEE ARTHROPLASTY;  Surgeon: Dannielle Huh, MD;  [facility];  Service: Orthopedics;  Laterality: Left;  Adductor Block  . TUBAL LIGATION  1985  . WRIST SURGERY  2/12    There were no vitals filed for this visit.   Subjective Assessment - 02/10/20 1400    Subjective PAtient reports that she was pretty sore after the last visit, she reports feeling stiff and tight today    Currently in Pain? Yes    Pain Score 3     Pain Location Knee    Pain Orientation Right;Medial;Lateral    Pain Descriptors / Indicators Tightness;Sore              [facility] PT Assessment - 02/10/20 0001      AROM   Right Knee Flexion 90      PROM   Right Knee Flexion 95                         [facility] Adult PT Treatment/Exercise - 02/10/20 0001       Knee/Hip Exercises: Aerobic   Elliptical I=8, R=6 x 6 minutes    Recumbent Bike 5 minutes, some hip hike and pain      Knee/Hip Exercises: Machines for Strengthening   Cybex Knee Extension 5# 2x20,  right only x10    Cybex Knee Flexion 20# 2x10    Cybex Leg Press 20# 2x10 small ROM, then no weitght just working on the ROM, I did push her once for ROM at the bottom and this caused pain      Programme researcher, broadcasting/film/video Location right knee    Electrical Stimulation Action IFC    Electrical Stimulation Parameters supine    Electrical Stimulation Goals Pain      Iontophoresis   Type of Iontophoresis Dexamethasone    Location right lateral knee    Dose 52mA    Time 4 hour patch      Manual Therapy   Manual Therapy Soft tissue mobilization;Passive ROM    Soft tissue mobilization to the quad , ITB area    Passive ROM passive motion to  flexion and extension                    PT Short Term Goals - 01/24/20 1736      PT SHORT TERM GOAL #1   Title indepdndent with initial HEP    Time 2    Period Weeks    Status New             PT Long Term Goals - 02/10/20 1449      PT LONG TERM GOAL #1   Title go up and down stairs step over step    Status On-going      PT LONG TERM GOAL #2   Title walk with a better gait, demonstrating a natural knee bend    Status On-going                 Plan - 02/10/20 1448    Clinical Impression Statement ROM measures better today, she did have pain when I tried to push her a little bit into flexion on the leg press, Pain is lateral to the patella today.  She is tender in the ITB distally    PT Next Visit Plan continue to push ROM and function but slowly due to set backs at times    Consulted and Agree with Plan of Care Patient           Patient will benefit from skilled therapeutic intervention in order to improve the following deficits and impairments:  Abnormal gait, Decreased range of motion, Difficulty  walking, Increased muscle spasms, Pain, Impaired flexibility, Decreased balance, Decreased mobility, Decreased strength, Increased edema  Visit Diagnosis: Acute pain of right knee  Stiffness of right knee, not elsewhere classified  Difficulty in walking, not elsewhere classified  Localized edema     Problem List Patient Active Problem List   Diagnosis Date Noted  . S/P total knee replacement 02/23/2018  . Essential hypertension 08/08/2015  . Osteoporosis 06/15/2014    Sumner Boast., PT 02/10/2020, 2:50 PM  [place] [address] [place], [place], [address] Phone: [phone]   Fax:  [phone]  Name: Lisa Wheeler MRN: [id number] Date of Birth: 28-Sep-1950

## 2020-02-14 ENCOUNTER — Other Ambulatory Visit: Payer: Self-pay

## 2020-02-14 ENCOUNTER — Encounter: Payer: Self-pay | Admitting: Physical Therapy

## 2020-02-14 ENCOUNTER — Ambulatory Visit: Payer: Medicare Other | Admitting: Physical Therapy

## 2020-02-14 DIAGNOSIS — R600 Localized edema: Secondary | ICD-10-CM

## 2020-02-14 DIAGNOSIS — M25561 Pain in right knee: Secondary | ICD-10-CM

## 2020-02-14 DIAGNOSIS — M25661 Stiffness of right knee, not elsewhere classified: Secondary | ICD-10-CM

## 2020-02-14 DIAGNOSIS — R262 Difficulty in walking, not elsewhere classified: Secondary | ICD-10-CM

## 2020-02-14 NOTE — Therapy (Signed)
[place], [place], [address] Phone: [phone]   Fax:  [phone]  Physical Therapy Treatment  Patient Details  Name: Lisa Wheeler MRN: [id number] Date of Birth: 07/18/1951 Referring Provider (PT): Lucey   Encounter Date: 02/14/2020   PT End of Session - 02/14/20 1149    Visit Number 7    Date for PT Re-Evaluation 03/25/20    PT Start Time 1055    PT Stop Time 1157    PT Time Calculation (min) 62 min    Activity Tolerance Patient tolerated treatment well    Behavior During Therapy [facility] for tasks assessed/performed           Past Medical History:  Diagnosis Date  . Arthritis   . Elevated lipids   . Hypertension   . Osteoarthritis of right knee    Severe  . Osteopenia    right hip    Past Surgical History:  Procedure Laterality Date  . CLOSED REDUCTION HAND FRACTURE    . OOPHORECTOMY     right  . TOTAL KNEE ARTHROPLASTY Right 02/23/2018   Procedure: TOTAL KNEE ARTHROPLASTY;  Surgeon: Vickey Huger, MD;  Location: [place];  Service: Orthopedics;  Laterality: Right;  . TOTAL KNEE ARTHROPLASTY Left 06/01/2018   Procedure: LEFT TOTAL KNEE ARTHROPLASTY;  Surgeon: Vickey Huger, MD;  [facility];  Service: Orthopedics;  Laterality: Left;  Adductor Block  . TUBAL LIGATION  1985  . WRIST SURGERY  2/12    There were no vitals filed for this visit.   Subjective Assessment - 02/14/20 1111    Subjective Reports still stiff, c/o pain medial knee today    Currently in Pain? Yes    Pain Score 3     Pain Location Knee    Pain Orientation Right;Medial    Aggravating Factors  reports stiff this morning                             [facility] Adult PT Treatment/Exercise - 02/14/20 0001      Knee/Hip Exercises: Aerobic   Elliptical I=8, R=6 x 6 minutes    Recumbent Bike 6 minutes hip hike at first then did better      Knee/Hip Exercises: Machines for Strengthening    Cybex Knee Extension 5# 2x20,  right only x10    Cybex Knee Flexion 20# 2x10      Knee/Hip Exercises: Standing   Other Standing Knee Exercises resisted gait fwd and back with cues to bend the knee    Other Standing Knee Exercises lunge stretch, obstacle stepping over trying to decrease hip hike and circumduction      Electrical Stimulation   Electrical Stimulation Location right knee    Electrical Stimulation Action IFC    Electrical Stimulation Parameters supine    Electrical Stimulation Goals Pain      Iontophoresis   Type of Iontophoresis Dexamethasone    Location right lateral knee    Dose 36mA    Time 4 hour patch      Manual Therapy   Manual Therapy Soft tissue mobilization;Passive ROM    Soft tissue mobilization to the quad , ITB area    Passive ROM passive motion to flexion and extension                    PT Short Term Goals - 01/24/20 1736  PT SHORT TERM GOAL #1   Title indepdndent with initial HEP    Time 2    Period Weeks    Status New             PT Long Term Goals - 02/10/20 1449      PT LONG TERM GOAL #1   Title go up and down stairs step over step    Status On-going      PT LONG TERM GOAL #2   Title walk with a better gait, demonstrating a natural knee bend    Status On-going                 Plan - 02/14/20 1149    Clinical Impression Statement Patient comes in walking stiff legged at toe off, slight hip hike, worked during session to decrease this, after the bike this was better, but she does tender to revert back without cues.  She is very tender right medial knee just below the joint line    PT Next Visit Plan continue to push ROM and function but slowly due to set backs at times    Consulted and Agree with Plan of Care Patient           Patient will benefit from skilled therapeutic intervention in order to improve the following deficits and impairments:  Abnormal gait, Decreased range of motion, Difficulty walking,  Increased muscle spasms, Pain, Impaired flexibility, Decreased balance, Decreased mobility, Decreased strength, Increased edema  Visit Diagnosis: Acute pain of right knee  Stiffness of right knee, not elsewhere classified  Difficulty in walking, not elsewhere classified  Localized edema     Problem List Patient Active Problem List   Diagnosis Date Noted  . S/P total knee replacement 02/23/2018  . Essential hypertension 08/08/2015  . Osteoporosis 06/15/2014    Jearld Lesch., PT 02/14/2020, 11:51 AM  [facility] [place] [facility] [address] [place], [place], [address] Phone: [phone]   Fax:  [phone]  Name: Lisa Wheeler MRN: [id number] Date of Birth: 1950-09-12

## 2020-02-17 ENCOUNTER — Ambulatory Visit: Payer: Medicare Other | Admitting: Physical Therapy

## 2020-02-17 ENCOUNTER — Other Ambulatory Visit: Payer: Self-pay

## 2020-02-17 ENCOUNTER — Encounter: Payer: Self-pay | Admitting: Physical Therapy

## 2020-02-17 DIAGNOSIS — M25661 Stiffness of right knee, not elsewhere classified: Secondary | ICD-10-CM

## 2020-02-17 DIAGNOSIS — R600 Localized edema: Secondary | ICD-10-CM

## 2020-02-17 DIAGNOSIS — M25561 Pain in right knee: Secondary | ICD-10-CM

## 2020-02-17 DIAGNOSIS — R262 Difficulty in walking, not elsewhere classified: Secondary | ICD-10-CM

## 2020-02-17 NOTE — Therapy (Signed)
[place], [place], [address] Phone: [phone]   Fax:  [phone]  Physical Therapy Treatment  Patient Details  Name: Lisa Wheeler MRN: [id number] Date of Birth: Sep 22, 1950 Referring Provider (PT): Lucey   Encounter Date: 02/17/2020   PT End of Session - 02/17/20 1605    Visit Number 8    Date for PT Re-Evaluation 03/25/20    PT Start Time 1525    PT Stop Time 1619    PT Time Calculation (min) 54 min    Activity Tolerance Patient tolerated treatment well    Behavior During Therapy [facility] for tasks assessed/performed           Past Medical History:  Diagnosis Date  . Arthritis   . Elevated lipids   . Hypertension   . Osteoarthritis of right knee    Severe  . Osteopenia    right hip    Past Surgical History:  Procedure Laterality Date  . CLOSED REDUCTION HAND FRACTURE    . OOPHORECTOMY     right  . TOTAL KNEE ARTHROPLASTY Right 02/23/2018   Procedure: TOTAL KNEE ARTHROPLASTY;  Surgeon: Vickey Huger, MD;  Location: [place];  Service: Orthopedics;  Laterality: Right;  . TOTAL KNEE ARTHROPLASTY Left 06/01/2018   Procedure: LEFT TOTAL KNEE ARTHROPLASTY;  Surgeon: Vickey Huger, MD;  [facility];  Service: Orthopedics;  Laterality: Left;  Adductor Block  . TUBAL LIGATION  1985  . WRIST SURGERY  2/12    There were no vitals filed for this visit.   Subjective Assessment - 02/17/20 1528    Subjective Reports that she practiced the marching, knee is hurting a little    Currently in Pain? Yes    Pain Score 3     Pain Location Knee                             [facility] Adult PT Treatment/Exercise - 02/17/20 0001      Knee/Hip Exercises: Aerobic   Elliptical I=8, R=6 x 6 minutes    Recumbent Bike 6 minutes hip hike at first then did better level 1      Knee/Hip Exercises: Machines for Strengthening   Cybex Knee Extension 5# 2x10 right only    Cybex Knee Flexion  25# 2x10    Cybex Leg Press 20# 2x10 small ROM, then no weitght just working on the ROM, I did push her once for ROM at the bottom and this caused pain      Knee/Hip Exercises: Standing   Other Standing Knee Exercises resisted gait fwd and back with cues to bend the knee    Other Standing Knee Exercises lunge stretch, obstacle stepping over trying to decrease hip hike and circumduction      Electrical Stimulation   Electrical Stimulation Location right knee    Electrical Stimulation Action IFC    Electrical Stimulation Parameters supine    Electrical Stimulation Goals Pain      Iontophoresis   Type of Iontophoresis Dexamethasone    Location right lateral knee    Dose 67mA    Time 4 hour patch #4      Manual Therapy   Manual Therapy Soft tissue mobilization;Passive ROM    Soft tissue mobilization to the quad , ITB area    Passive ROM passive motion to flexion and extension  PT Short Term Goals - 01/24/20 1736      PT SHORT TERM GOAL #1   Title indepdndent with initial HEP    Time 2    Period Weeks    Status New             PT Long Term Goals - 02/17/20 1611      PT LONG TERM GOAL #1   Title go up and down stairs step over step    Status On-going      PT LONG TERM GOAL #2   Title walk with a better gait, demonstrating a natural knee bend    Status On-going                 Plan - 02/17/20 1606    Clinical Impression Statement Patient walking better today, still pain with flexion.  She is still stiff legged at times but does better with cues.  She was weak on the right only exercises with weight and had to stop at about 7 reps due to fatigue.  She is less tender    PT Next Visit Plan continue to push ROM and function but slowly due to set backs at times    Consulted and Agree with Plan of Care Patient           Patient will benefit from skilled therapeutic intervention in order to improve the following deficits and  impairments:  Abnormal gait, Decreased range of motion, Difficulty walking, Increased muscle spasms, Pain, Impaired flexibility, Decreased balance, Decreased mobility, Decreased strength, Increased edema  Visit Diagnosis: Acute pain of right knee  Stiffness of right knee, not elsewhere classified  Difficulty in walking, not elsewhere classified  Localized edema     Problem List Patient Active Problem List   Diagnosis Date Noted  . S/P total knee replacement 02/23/2018  . Essential hypertension 08/08/2015  . Osteoporosis 06/15/2014    Jearld Lesch., PT 02/17/2020, 4:12 PM  [facility] [address] [place], [place], [address] Phone: [phone]   Fax:  [phone]  Name: Lisa Wheeler MRN: [id number] Date of Birth: March 17, 1951

## 2020-02-21 ENCOUNTER — Ambulatory Visit: Payer: Medicare Other | Admitting: Physical Therapy

## 2020-02-21 ENCOUNTER — Other Ambulatory Visit: Payer: Self-pay

## 2020-02-21 ENCOUNTER — Encounter: Payer: Self-pay | Admitting: Physical Therapy

## 2020-02-21 DIAGNOSIS — R262 Difficulty in walking, not elsewhere classified: Secondary | ICD-10-CM

## 2020-02-21 DIAGNOSIS — M25661 Stiffness of right knee, not elsewhere classified: Secondary | ICD-10-CM

## 2020-02-21 DIAGNOSIS — M25561 Pain in right knee: Secondary | ICD-10-CM | POA: Diagnosis not present

## 2020-02-21 DIAGNOSIS — R600 Localized edema: Secondary | ICD-10-CM

## 2020-02-21 NOTE — Therapy (Signed)
[facility] [place] [facility] [address] [place], [place], [address] Phone: [phone]   Fax:  [phone]  Physical Therapy Treatment  Patient Details  Name: Lisa Wheeler MRN: [id number] Date of Birth: May 14, 1951 Referring Provider (PT): Lucey   Encounter Date: 02/21/2020   PT End of Session - 02/21/20 1740    Visit Number 9    Date for PT Re-Evaluation 03/25/20    PT Start Time 1650    PT Stop Time 1751    PT Time Calculation (min) 61 min    Activity Tolerance Patient tolerated treatment well    Behavior During Therapy [facility] for tasks assessed/performed           Past Medical History:  Diagnosis Date  . Arthritis   . Elevated lipids   . Hypertension   . Osteoarthritis of right knee    Severe  . Osteopenia    right hip    Past Surgical History:  Procedure Laterality Date  . CLOSED REDUCTION HAND FRACTURE    . OOPHORECTOMY     right  . TOTAL KNEE ARTHROPLASTY Right 02/23/2018   Procedure: TOTAL KNEE ARTHROPLASTY;  Surgeon: Dannielle Huh, MD;  [facility];  Service: Orthopedics;  Laterality: Right;  . TOTAL KNEE ARTHROPLASTY Left 06/01/2018   Procedure: LEFT TOTAL KNEE ARTHROPLASTY;  Surgeon: Dannielle Huh, MD;  [facility];  Service: Orthopedics;  Laterality: Left;  Adductor Block  . TUBAL LIGATION  1985  . WRIST SURGERY  2/12    There were no vitals filed for this visit.   Subjective Assessment - 02/21/20 1653    Subjective Patient reports that she had two really "great days", better movements and no pain, she reports that last night she noticed some swelling, and then this morning she could not bend it again    Currently in Pain? Yes    Pain Score 3     Pain Location Knee    Pain Orientation Right    Pain Descriptors / Indicators Tightness                             [facility] Adult PT Treatment/Exercise - 02/21/20 0001      Ambulation/Gait   Gait Comments backward walking and  marching to try to get a better bend and more natural and no hip hike      Knee/Hip Exercises: Aerobic   Elliptical I=8, R=6 x 6 minutes    Recumbent Bike 6 minutes seat #8       Knee/Hip Exercises: Machines for Strengthening   Cybex Knee Extension 5# 2x10 right only    Cybex Knee Flexion 25# 2x10    Cybex Leg Press 20# down to about 3.5 with toes even with the top plate      Insurance claims handler Stimulation Location right knee    Electrical Stimulation Action IFC    Electrical Stimulation Parameters supine    Electrical Stimulation Goals Pain      Iontophoresis   Type of Iontophoresis Dexamethasone    Location right lateral knee    Dose 10mA    Time 4 hour patch #5      Manual Therapy   Manual Therapy Soft tissue mobilization;Passive ROM    Soft tissue mobilization to the quad , ITB area    Passive ROM focus on passive flexion, with her in sitting and then with her in prone  PT Short Term Goals - 01/24/20 1736      PT SHORT TERM GOAL #1   Title indepdndent with initial HEP    Time 2    Period Weeks    Status New             PT Long Term Goals - 02/17/20 1611      PT LONG TERM GOAL #1   Title go up and down stairs step over step    Status On-going      PT LONG TERM GOAL #2   Title walk with a better gait, demonstrating a natural knee bend    Status On-going                 Plan - 02/21/20 1741    Clinical Impression Statement Patient reports that she had a great weekend great movement and no pain, reports that last night she had some swelling and some stiffness, reports tha tthis morning she could not bend the knee when walking, reports pain a 6-7/10, she reports that as the day went along it got a little better.  She is moving well this afternoon, prone stretches cause pretty tight quads    PT Next Visit Plan try to get more ROM and less stiff legged walking    Consulted and Agree with Plan of Care Patient            Patient will benefit from skilled therapeutic intervention in order to improve the following deficits and impairments:  Abnormal gait, Decreased range of motion, Difficulty walking, Increased muscle spasms, Pain, Impaired flexibility, Decreased balance, Decreased mobility, Decreased strength, Increased edema  Visit Diagnosis: Acute pain of right knee  Stiffness of right knee, not elsewhere classified  Difficulty in walking, not elsewhere classified  Localized edema     Problem List Patient Active Problem List   Diagnosis Date Noted  . S/P total knee replacement 02/23/2018  . Essential hypertension 08/08/2015  . Osteoporosis 06/15/2014    Sumner Boast., PT 02/21/2020, 5:45 PM  [place] [address] [place], [place], [address] Phone: [phone]   Fax:  [phone]  Name: Lisa Wheeler MRN: [id number] Date of Birth: July 24, 1951

## 2020-02-24 ENCOUNTER — Ambulatory Visit: Payer: Medicare Other | Admitting: Physical Therapy

## 2020-02-24 ENCOUNTER — Other Ambulatory Visit: Payer: Self-pay

## 2020-02-24 ENCOUNTER — Encounter: Payer: Self-pay | Admitting: Physical Therapy

## 2020-02-24 DIAGNOSIS — R262 Difficulty in walking, not elsewhere classified: Secondary | ICD-10-CM

## 2020-02-24 DIAGNOSIS — M25661 Stiffness of right knee, not elsewhere classified: Secondary | ICD-10-CM

## 2020-02-24 DIAGNOSIS — M25561 Pain in right knee: Secondary | ICD-10-CM

## 2020-02-24 DIAGNOSIS — R600 Localized edema: Secondary | ICD-10-CM

## 2020-02-24 NOTE — Therapy (Signed)
[address] [place], [place], [address] Phone: [phone]   Fax:  [phone] Progress Note Reporting Period 01/24/20 to 02/24/20 for the first 10 visits See note below for Objective Data and Assessment of Progress/Goals.      Physical Therapy Treatment  Patient Details  Name: Lisa Wheeler MRN: [id number] Date of Birth: 1951/03/13 Referring Provider (PT): Lucey   Encounter Date: 02/24/2020   PT End of Session - 02/24/20 0927    Visit Number 10    Date for PT Re-Evaluation 03/25/20    PT Start Time 0840    PT Stop Time 0940    PT Time Calculation (min) 60 min    Activity Tolerance Patient tolerated treatment well    Behavior During Therapy [facility] for tasks assessed/performed           Past Medical History:  Diagnosis Date  . Arthritis   . Elevated lipids   . Hypertension   . Osteoarthritis of right knee    Severe  . Osteopenia    right hip    Past Surgical History:  Procedure Laterality Date  . CLOSED REDUCTION HAND FRACTURE    . OOPHORECTOMY     right  . TOTAL KNEE ARTHROPLASTY Right 02/23/2018   Procedure: TOTAL KNEE ARTHROPLASTY;  Surgeon: Vickey Huger, MD;  Location: [place];  Service: Orthopedics;  Laterality: Right;  . TOTAL KNEE ARTHROPLASTY Left 06/01/2018   Procedure: LEFT TOTAL KNEE ARTHROPLASTY;  Surgeon: Vickey Huger, MD;  Location[facility];  Service: Orthopedics;  Laterality: Left;  Adductor Block  . TUBAL LIGATION  1985  . WRIST SURGERY  2/12    There were no vitals filed for this visit.   Subjective Assessment - 02/24/20 0845    Subjective Patient reports that she is doing well, no issues    Currently in Pain? Yes    Pain Score 3     Pain Location Knee    Pain Orientation Right    Pain Relieving Factors "the treatment"                             [facility] Adult PT Treatment/Exercise - 02/24/20 0001      Knee/Hip Exercises: Aerobic   Elliptical I=8,  R=6 x 6 minutes    Recumbent Bike 6 minutes seat #8       Knee/Hip Exercises: Machines for Strengthening   Cybex Knee Extension 5# 2x10 right only    Cybex Knee Flexion 20# right only 2x10    Cybex Leg Press 20# down to about 3.5 with toes even with the top plate      Knee/Hip Exercises: Standing   Other Standing Knee Exercises resisted gait fwd and back with cues to bend the knee    Other Standing Knee Exercises lunge stretch, obstacle stepping over trying to decrease hip hike and circumduction      Electrical Stimulation   Electrical Stimulation Location right knee    Electrical Stimulation Action IFC    Electrical Stimulation Parameters supine    Electrical Stimulation Goals Pain      Manual Therapy   Manual Therapy Soft tissue mobilization;Passive ROM    Soft tissue mobilization to the quad , ITB area    Passive ROM focus on passive flexion, with her in sitting and then with her in prone, added some passive extension today  PT Short Term Goals - 01/24/20 1736      PT SHORT TERM GOAL #1   Title indepdndent with initial HEP    Time 2    Period Weeks    Status New             PT Long Term Goals - 02/24/20 0929      PT LONG TERM GOAL #1   Title go up and down stairs step over step    Status Partially Met      PT LONG TERM GOAL #2   Title walk with a better gait, demonstrating a natural knee bend    Status Partially Met                 Plan - 02/24/20 0928    Clinical Impression Statement Patient reports feeling good, she alloed more ROM especially extension with much c/o pain, she was able to move up the bike posistion some without much pain and without hip hike    PT Next Visit Plan try to get more ROM and less stiff legged walking    Consulted and Agree with Plan of Care Patient           Patient will benefit from skilled therapeutic intervention in order to improve the following deficits and impairments:  Abnormal gait,  Decreased range of motion, Difficulty walking, Increased muscle spasms, Pain, Impaired flexibility, Decreased balance, Decreased mobility, Decreased strength, Increased edema  Visit Diagnosis: Acute pain of right knee  Stiffness of right knee, not elsewhere classified  Difficulty in walking, not elsewhere classified  Localized edema     Problem List Patient Active Problem List   Diagnosis Date Noted  . S/P total knee replacement 02/23/2018  . Essential hypertension 08/08/2015  . Osteoporosis 06/15/2014    Sumner Boast., PT 02/24/2020, 9:30 AM  [place] [place], [place], [address] Phone: [phone]   Fax:  [phone]  Name: Lisa Wheeler MRN: [id number] Date of Birth: 06-26-1951

## 2020-02-25 ENCOUNTER — Ambulatory Visit: Payer: Medicare Other | Admitting: Obstetrics & Gynecology

## 2020-02-28 ENCOUNTER — Encounter: Payer: Self-pay | Admitting: Physical Therapy

## 2020-02-28 ENCOUNTER — Other Ambulatory Visit: Payer: Self-pay

## 2020-02-28 ENCOUNTER — Ambulatory Visit: Payer: Medicare Other | Admitting: Physical Therapy

## 2020-02-28 DIAGNOSIS — R600 Localized edema: Secondary | ICD-10-CM

## 2020-02-28 DIAGNOSIS — M25561 Pain in right knee: Secondary | ICD-10-CM | POA: Diagnosis not present

## 2020-02-28 DIAGNOSIS — R262 Difficulty in walking, not elsewhere classified: Secondary | ICD-10-CM

## 2020-02-28 DIAGNOSIS — M25661 Stiffness of right knee, not elsewhere classified: Secondary | ICD-10-CM

## 2020-02-28 NOTE — Therapy (Signed)
[place] [address] [place], [place], [address] Phone: [phone]   Fax:  [phone]  Physical Therapy Treatment  Patient Details  Name: Lisa Wheeler MRN: [id number] Date of Birth: 17-Jul-1951 Referring Provider (PT): Lucey   Encounter Date: 02/28/2020   PT End of Session - 02/28/20 1143    Visit Number 11    Date for PT Re-Evaluation 03/25/20    PT Start Time 1100    PT Stop Time 1150    PT Time Calculation (min) 50 min    Activity Tolerance Patient tolerated treatment well    Behavior During Therapy [facility] for tasks assessed/performed           Past Medical History:  Diagnosis Date  . Arthritis   . Elevated lipids   . Hypertension   . Osteoarthritis of right knee    Severe  . Osteopenia    right hip    Past Surgical History:  Procedure Laterality Date  . CLOSED REDUCTION HAND FRACTURE    . OOPHORECTOMY     right  . TOTAL KNEE ARTHROPLASTY Right 02/23/2018   Procedure: TOTAL KNEE ARTHROPLASTY;  Surgeon: Vickey Huger, MD;  Location: [place];  Service: Orthopedics;  Laterality: Right;  . TOTAL KNEE ARTHROPLASTY Left 06/01/2018   Procedure: LEFT TOTAL KNEE ARTHROPLASTY;  Surgeon: Vickey Huger, MD;  [facility];  Service: Orthopedics;  Laterality: Left;  Adductor Block  . TUBAL LIGATION  1985  . WRIST SURGERY  2/12    There were no vitals filed for this visit.   Subjective Assessment - 02/28/20 1105    Subjective Patient reports that she feels stiff int he knee today    Currently in Pain? Yes    Pain Score 2     Pain Location Knee    Pain Orientation Right    Pain Descriptors / Indicators Tightness    Aggravating Factors  stiff over the weekend                             [facility] Adult PT Treatment/Exercise - 02/28/20 0001      Knee/Hip Exercises: Aerobic   Elliptical I=8, R=6 x 6 minutes    Recumbent Bike 6 minutes seat #8       Knee/Hip Exercises: Machines for  Strengthening   Cybex Knee Extension 5# 2x10 right only    Cybex Knee Flexion 20# right only 2x10    Cybex Leg Press 20# down to about 3.5 with toes even with the top plate      Iontophoresis   Type of Iontophoresis Dexamethasone    Location right lateral knee    Dose 69m    Time 4 hour patch #6      Manual Therapy   Manual Therapy Soft tissue mobilization;Passive ROM    Soft tissue mobilization to the quad , ITB area    Passive ROM passive flexion and extension                    PT Short Term Goals - 01/24/20 1736      PT SHORT TERM GOAL #1   Title indepdndent with initial HEP    Time 2    Period Weeks    Status New             PT Long Term Goals - 02/24/20 06546     PT LONG TERM GOAL #  1   Title go up and down stairs step over step    Status Partially Met      PT LONG TERM GOAL #2   Title walk with a better gait, demonstrating a natural knee bend    Status Partially Met                 Plan - 02/28/20 1146    Clinical Impression Statement Last ionto patch was applied today, she tolerated the PROM well without any difficulty  She still is pretty tight and painful into flexion and extsnsion.  She has not had any other issues with the locking up    PT Next Visit Plan try to get more ROM and less stiff legged walking    Consulted and Agree with Plan of Care Patient           Patient will benefit from skilled therapeutic intervention in order to improve the following deficits and impairments:  Abnormal gait, Decreased range of motion, Difficulty walking, Increased muscle spasms, Pain, Impaired flexibility, Decreased balance, Decreased mobility, Decreased strength, Increased edema  Visit Diagnosis: Acute pain of right knee  Stiffness of right knee, not elsewhere classified  Difficulty in walking, not elsewhere classified  Localized edema     Problem List Patient Active Problem List   Diagnosis Date Noted  . S/P total knee replacement  02/23/2018  . Essential hypertension 08/08/2015  . Osteoporosis 06/15/2014    Sumner Boast., PT 02/28/2020, 11:48 AM  [facility] [address] [place], [place], [address] Phone: [phone]   Fax:  [phone]  Name: Lisa Wheeler MRN: [id number] Date of Birth: [birth year]

## 2020-03-02 ENCOUNTER — Other Ambulatory Visit: Payer: Self-pay

## 2020-03-02 ENCOUNTER — Ambulatory Visit: Payer: Medicare Other | Attending: Orthopedic Surgery | Admitting: Physical Therapy

## 2020-03-02 ENCOUNTER — Encounter: Payer: Self-pay | Admitting: Physical Therapy

## 2020-03-02 DIAGNOSIS — R262 Difficulty in walking, not elsewhere classified: Secondary | ICD-10-CM

## 2020-03-02 DIAGNOSIS — M25661 Stiffness of right knee, not elsewhere classified: Secondary | ICD-10-CM

## 2020-03-02 DIAGNOSIS — M25561 Pain in right knee: Secondary | ICD-10-CM | POA: Diagnosis not present

## 2020-03-02 DIAGNOSIS — R600 Localized edema: Secondary | ICD-10-CM

## 2020-03-02 NOTE — Therapy (Signed)
[place], [place], [address] Phone: [phone]   Fax:  [phone]  Physical Therapy Treatment  Patient Details  Name: Lisa Wheeler MRN: [id number] Date of Birth: [birth year] Referring Provider (PT): Lucey   Encounter Date: 03/02/2020   PT End of Session - 03/02/20 1739    Visit Number 12    Date for PT Re-Evaluation 03/25/20    PT Start Time 8341    PT Stop Time 1615    PT Time Calculation (min) 45 min    Activity Tolerance Patient tolerated treatment well    Behavior During Therapy [facility] for tasks assessed/performed           Past Medical History:  Diagnosis Date  . Arthritis   . Elevated lipids   . Hypertension   . Osteoarthritis of right knee    Severe  . Osteopenia    right hip    Past Surgical History:  Procedure Laterality Date  . CLOSED REDUCTION HAND FRACTURE    . OOPHORECTOMY     right  . TOTAL KNEE ARTHROPLASTY Right 02/23/2018   Procedure: TOTAL KNEE ARTHROPLASTY;  Surgeon: Vickey Huger, MD;  Location: [place];  Service: Orthopedics;  Laterality: Right;  . TOTAL KNEE ARTHROPLASTY Left 06/01/2018   Procedure: LEFT TOTAL KNEE ARTHROPLASTY;  Surgeon: Vickey Huger, MD;  [facility];  Service: Orthopedics;  Laterality: Left;  Adductor Block  . TUBAL LIGATION  1985  . WRIST SURGERY  2/12    There were no vitals filed for this visit.   Subjective Assessment - 03/02/20 1531    Subjective Reports that she has been going to the gym some and no issues, she reports that she is a little sore laterally    Currently in Pain? Yes    Pain Score 2     Pain Location Knee    Pain Orientation Right    Pain Descriptors / Indicators Sore              [facility] PT Assessment - 03/02/20 0001      AROM   Right Knee Extension 12    Right Knee Flexion 106                         [facility] Adult PT Treatment/Exercise - 03/02/20 0001      Knee/Hip Exercises: Aerobic    Elliptical I=8, R=6 x 6 minutes    Recumbent Bike 6 minutes seat #6       Knee/Hip Exercises: Machines for Strengthening   Cybex Knee Extension 5# 2x10 right only    Cybex Knee Flexion 20# right only 2x10    Cybex Leg Press 20# bilaterally small ROM focus on extension, then down lowere       Acupuncturist Stimulation Location right knee    Electrical Stimulation Action IFC    Electrical Stimulation Parameters supine    Electrical Stimulation Goals Pain      Manual Therapy   Manual Therapy Soft tissue mobilization;Passive ROM    Soft tissue mobilization to the quad , ITB area    Passive ROM passive flexion and extension                    PT Short Term Goals - 01/24/20 1736      PT SHORT TERM GOAL #1   Title indepdndent with initial HEP    Time 2  Period Weeks    Status New             PT Long Term Goals - 03/02/20 1742      PT LONG TERM GOAL #1   Title go up and down stairs step over step    Status Partially Met      PT LONG TERM GOAL #2   Title walk with a better gait, demonstrating a natural knee bend    Status Partially Met      PT LONG TERM GOAL #3   Title increase AROM to 5-115 degrees flexion    Status Partially Met      PT LONG TERM GOAL #4   Title decrease pain 50%    Status Achieved                 Plan - 03/02/20 1740    Clinical Impression Statement Patient has had a great improvement in her AROM, she is walking better, and has not had any recent issues with the locking, she is still tender and sore, biggest worry is why does this locking happen as she has had this two times.  She is startign to go to the gym, and we really went over safety and how to go very slowly as to not over do it    PT Next Visit Plan see a couple more weeks and then she may return to the MD    Consulted and Agree with Plan of Care Patient           Patient will benefit from skilled therapeutic intervention in order to improve  the following deficits and impairments:  Abnormal gait, Decreased range of motion, Difficulty walking, Increased muscle spasms, Pain, Impaired flexibility, Decreased balance, Decreased mobility, Decreased strength, Increased edema  Visit Diagnosis: Acute pain of right knee  Stiffness of right knee, not elsewhere classified  Difficulty in walking, not elsewhere classified  Localized edema     Problem List Patient Active Problem List   Diagnosis Date Noted  . S/P total knee replacement 02/23/2018  . Essential hypertension 08/08/2015  . Osteoporosis 06/15/2014    Sumner Boast., PT 03/02/2020, 5:43 PM  [place] [address] [place], [place], [address] Phone: [phone]   Fax:  [phone]  Name: CARLEI HUANG MRN: [id number] Date of Birth: 03-17-1951

## 2020-03-07 ENCOUNTER — Other Ambulatory Visit: Payer: Self-pay

## 2020-03-07 ENCOUNTER — Encounter: Payer: Self-pay | Admitting: Physical Therapy

## 2020-03-07 ENCOUNTER — Ambulatory Visit: Payer: Medicare Other | Admitting: Physical Therapy

## 2020-03-07 DIAGNOSIS — R262 Difficulty in walking, not elsewhere classified: Secondary | ICD-10-CM

## 2020-03-07 DIAGNOSIS — M25661 Stiffness of right knee, not elsewhere classified: Secondary | ICD-10-CM

## 2020-03-07 DIAGNOSIS — M25561 Pain in right knee: Secondary | ICD-10-CM

## 2020-03-07 DIAGNOSIS — R600 Localized edema: Secondary | ICD-10-CM

## 2020-03-07 NOTE — Therapy (Signed)
[place] [address] [address] [place], [place], [address] Phone: [phone]   Fax:  [phone]  Physical Therapy Treatment  Patient Details  Name: Lisa Wheeler MRN: [id number] Date of Birth: 14-Jan-1951 Referring Provider (PT): Lucey   Encounter Date: 03/07/2020   PT End of Session - 03/07/20 1517    Visit Number 13    Date for PT Re-Evaluation 03/25/20    PT Start Time 1400    PT Stop Time 1450    PT Time Calculation (min) 50 min    Activity Tolerance Patient tolerated treatment well    Behavior During Therapy [facility] for tasks assessed/performed           Past Medical History:  Diagnosis Date  . Arthritis   . Elevated lipids   . Hypertension   . Osteoarthritis of right knee    Severe  . Osteopenia    right hip    Past Surgical History:  Procedure Laterality Date  . CLOSED REDUCTION HAND FRACTURE    . OOPHORECTOMY     right  . TOTAL KNEE ARTHROPLASTY Right 02/23/2018   Procedure: TOTAL KNEE ARTHROPLASTY;  Surgeon: Vickey Huger, MD;  Location: [place];  Service: Orthopedics;  Laterality: Right;  . TOTAL KNEE ARTHROPLASTY Left 06/01/2018   Procedure: LEFT TOTAL KNEE ARTHROPLASTY;  Surgeon: Vickey Huger, MD;  [facility];  Service: Orthopedics;  Laterality: Left;  Adductor Block  . TUBAL LIGATION  1985  . WRIST SURGERY  2/12    There were no vitals filed for this visit.   Subjective Assessment - 03/07/20 1359    Subjective I am really stiff this morning    Currently in Pain? Yes    Pain Location Knee    Pain Orientation Right    Pain Descriptors / Indicators Tightness    Aggravating Factors  stiff                             [facility] Adult PT Treatment/Exercise - 03/07/20 0001      Knee/Hip Exercises: Aerobic   Elliptical I=8, R=6 x 6 minutes    Recumbent Bike 6 minutes seat #6       Knee/Hip Exercises: Machines for Strengthening   Cybex Knee Extension 5# 2x10 right only     Cybex Knee Flexion 20# right only 2x10    Cybex Leg Press 20# bilaterally small ROM focus on extension, then down lowere       Manual Therapy   Manual Therapy Soft tissue mobilization;Passive ROM    Soft tissue mobilization to the quad , ITB area    Passive ROM passive flexion and extension                    PT Short Term Goals - 01/24/20 1736      PT SHORT TERM GOAL #1   Title indepdndent with initial HEP    Time 2    Period Weeks    Status New             PT Long Term Goals - 03/02/20 1742      PT LONG TERM GOAL #1   Title go up and down stairs step over step    Status Partially Met      PT LONG TERM GOAL #2   Title walk with a better gait, demonstrating a natural knee bend    Status Partially Met  PT LONG TERM GOAL #3   Title increase AROM to 5-115 degrees flexion    Status Partially Met      PT LONG TERM GOAL #4   Title decrease pain 50%    Status Achieved                 Plan - 03/07/20 1518    Clinical Impression Statement Patient a little more stiff after the weekend.  She has questions about going to MD, how to continue on her own, we talked some about how to do on her own, she really was doing well and I feel was doing conservative incresaes but seems that she ended up over doing it, she was up to 20 minutes 3x/day on the bike, she was doing a lot of walking as well, we discussed again being very careful with doing things on her own    PT Next Visit Plan see a couple more weeks and then she may return to the MD    Consulted and Agree with Plan of Care Patient           Patient will benefit from skilled therapeutic intervention in order to improve the following deficits and impairments:  Abnormal gait, Decreased range of motion, Difficulty walking, Increased muscle spasms, Pain, Impaired flexibility, Decreased balance, Decreased mobility, Decreased strength, Increased edema  Visit Diagnosis: Acute pain of right knee  Stiffness of  right knee, not elsewhere classified  Difficulty in walking, not elsewhere classified  Localized edema     Problem List Patient Active Problem List   Diagnosis Date Noted  . S/P total knee replacement 02/23/2018  . Essential hypertension 08/08/2015  . Osteoporosis 06/15/2014    Sumner Boast., PT 03/07/2020, 3:21 PM  [place] [address] [place], [place], [address] Phone: [phone]   Fax:  [phone]  Name: Lisa Wheeler MRN: [id number] Date of Birth: 1951/06/28

## 2020-03-09 ENCOUNTER — Encounter: Payer: Self-pay | Admitting: Physical Therapy

## 2020-03-09 ENCOUNTER — Ambulatory Visit: Payer: Medicare Other | Admitting: Physical Therapy

## 2020-03-09 ENCOUNTER — Other Ambulatory Visit: Payer: Self-pay

## 2020-03-09 DIAGNOSIS — M25561 Pain in right knee: Secondary | ICD-10-CM | POA: Diagnosis not present

## 2020-03-09 DIAGNOSIS — R262 Difficulty in walking, not elsewhere classified: Secondary | ICD-10-CM

## 2020-03-09 DIAGNOSIS — M25661 Stiffness of right knee, not elsewhere classified: Secondary | ICD-10-CM

## 2020-03-09 NOTE — Therapy (Signed)
[place], [place], [address] Phone: [phone]   Fax:  [phone]  Physical Therapy Treatment  Patient Details  Name: Lisa Wheeler MRN: [id number] Date of Birth: Mar 12, 1951 Referring Provider (PT): Lucey   Encounter Date: 03/09/2020   PT End of Session - 03/09/20 1443    Visit Number 14    Date for PT Re-Evaluation 03/25/20    PT Start Time 6759    PT Stop Time 1448    PT Time Calculation (min) 50 min    Activity Tolerance Patient tolerated treatment well    Behavior During Therapy [facility] for tasks assessed/performed           Past Medical History:  Diagnosis Date  . Arthritis   . Elevated lipids   . Hypertension   . Osteoarthritis of right knee    Severe  . Osteopenia    right hip    Past Surgical History:  Procedure Laterality Date  . CLOSED REDUCTION HAND FRACTURE    . OOPHORECTOMY     right  . TOTAL KNEE ARTHROPLASTY Right 02/23/2018   Procedure: TOTAL KNEE ARTHROPLASTY;  Surgeon: Vickey Huger, MD;  Location: [place];  Service: Orthopedics;  Laterality: Right;  . TOTAL KNEE ARTHROPLASTY Left 06/01/2018   Procedure: LEFT TOTAL KNEE ARTHROPLASTY;  Surgeon: Vickey Huger, MD;  [facility];  Service: Orthopedics;  Laterality: Left;  Adductor Block  . TUBAL LIGATION  1985  . WRIST SURGERY  2/12    There were no vitals filed for this visit.   Subjective Assessment - 03/09/20 1359    Subjective No real problems that past few days    Currently in Pain? Yes    Pain Location Knee    Pain Orientation Right              [facility] PT Assessment - 03/09/20 0001      AROM   Right Knee Flexion 106      PROM   Right Knee Flexion 110                         [facility] Adult PT Treatment/Exercise - 03/09/20 0001      Knee/Hip Exercises: Aerobic   Elliptical I=11, R=6 x 6 minutes    Recumbent Bike 6 minutes seat #6       Knee/Hip Exercises: Standing   Other  Standing Knee Exercises resisted gait fwd and back with cues to bend the knee    Other Standing Knee Exercises stairs step over step, then lunge stretch foot on the second step      Manual Therapy   Manual Therapy Soft tissue mobilization;Passive ROM    Soft tissue mobilization to the quad , ITB area, the scar and some patellar mobilization    Passive ROM passive flexion and extension                    PT Short Term Goals - 01/24/20 1736      PT SHORT TERM GOAL #1   Title indepdndent with initial HEP    Time 2    Period Weeks    Status New             PT Long Term Goals - 03/09/20 1447      PT LONG TERM GOAL #1   Title go up and down stairs step over step    Status Achieved  PT LONG TERM GOAL #2   Title walk with a better gait, demonstrating a natural knee bend    Status Partially Met      PT LONG TERM GOAL #3   Title increase AROM to 5-115 degrees flexion    Status Partially Met      PT LONG TERM GOAL #4   Title decrease pain 50%    Status Achieved                 Plan - 03/09/20 1445    Clinical Impression Statement Patient with less pain, the knee and the surronding mms and tissue really feel looser and not as tight and dense.  She is limited with ROM but it is improved and she is not in as much pain, starting to educate on the after PT routine to do and how to be careful, the MD did recommened that she not do has intense of workouts as she was dling previouslyy    PT Next Visit Plan next week educate and work on the after PT program    Consulted and Agree with Plan of Care Patient           Patient will benefit from skilled therapeutic intervention in order to improve the following deficits and impairments:  Abnormal gait, Decreased range of motion, Difficulty walking, Increased muscle spasms, Pain, Impaired flexibility, Decreased balance, Decreased mobility, Decreased strength, Increased edema  Visit Diagnosis: Acute pain of right  knee  Stiffness of right knee, not elsewhere classified  Difficulty in walking, not elsewhere classified     Problem List Patient Active Problem List   Diagnosis Date Noted  . S/P total knee replacement 02/23/2018  . Essential hypertension 08/08/2015  . Osteoporosis 06/15/2014    Sumner Boast., PT 03/09/2020, 2:48 PM  [place] [address] [address] [place], [place], [address] Phone: [phone]   Fax:  [phone]  Name: KYNDLE SCHLENDER MRN: [id number] Date of Birth: 03-Apr-1951

## 2020-03-13 ENCOUNTER — Encounter: Payer: Self-pay | Admitting: Physical Therapy

## 2020-03-13 ENCOUNTER — Other Ambulatory Visit: Payer: Self-pay

## 2020-03-13 ENCOUNTER — Ambulatory Visit: Payer: Medicare Other | Admitting: Physical Therapy

## 2020-03-13 DIAGNOSIS — M25661 Stiffness of right knee, not elsewhere classified: Secondary | ICD-10-CM

## 2020-03-13 DIAGNOSIS — M25561 Pain in right knee: Secondary | ICD-10-CM | POA: Diagnosis not present

## 2020-03-13 DIAGNOSIS — R262 Difficulty in walking, not elsewhere classified: Secondary | ICD-10-CM

## 2020-03-13 NOTE — Therapy (Signed)
[facility], [place], [address] Phone: [phone]   Fax:  [phone]  Physical Therapy Treatment  Patient Details  Name: Lisa Wheeler MRN: [id number] Date of Birth: 1951-04-21 Referring Provider (PT): Lucey   Encounter Date: 03/13/2020   PT End of Session - 03/13/20 1438    Visit Number 15    Date for PT Re-Evaluation 03/25/20    PT Start Time 1353    PT Stop Time 1454    PT Time Calculation (min) 61 min    Activity Tolerance Patient tolerated treatment well    Behavior During Therapy [facility] for tasks assessed/performed           Past Medical History:  Diagnosis Date  . Arthritis   . Elevated lipids   . Hypertension   . Osteoarthritis of right knee    Severe  . Osteopenia    right hip    Past Surgical History:  Procedure Laterality Date  . CLOSED REDUCTION HAND FRACTURE    . OOPHORECTOMY     right  . TOTAL KNEE ARTHROPLASTY Right 02/23/2018   Procedure: TOTAL KNEE ARTHROPLASTY;  Surgeon: Vickey Huger, MD;  Location: [place] [facility];  Service: Orthopedics;  Laterality: Right;  . TOTAL KNEE ARTHROPLASTY Left 06/01/2018   Procedure: LEFT TOTAL KNEE ARTHROPLASTY;  Surgeon: Vickey Huger, MD;  Location: [facility];  Service: Orthopedics;  Laterality: Left;  Adductor Block  . TUBAL LIGATION  1985  . WRIST SURGERY  2/12    There were no vitals filed for this visit.   Subjective Assessment - 03/13/20 1354    Subjective "No pain, just stiff and swollen"                             [facility] Adult PT Treatment/Exercise - 03/13/20 0001      Knee/Hip Exercises: Aerobic   Elliptical I=11, R=6 x 6 minutes    Recumbent Bike 6 minutes seat #6       Knee/Hip Exercises: Machines for Strengthening   Cybex Knee Extension 5# 2x10 right only    Cybex Knee Flexion 20# right only 2x10    Cybex Leg Press 20# then no weight working on going low, got to posistion #2 multiple times with toes even  with top of footplate, then toes a little higher all the way to the bottom      Electrical Stimulation   Electrical Stimulation Location right knee due to c/o pain after PROM    Electrical Stimulation Action IFC    Electrical Stimulation Parameters supine    Electrical Stimulation Goals Pain      Manual Therapy   Manual Therapy Soft tissue mobilization;Passive ROM    Soft tissue mobilization to the quad , ITB area, the scar and some patellar mobilization, use of some vibration    Passive ROM passive flexion and extension, tried today in prone and some contract relax                    PT Short Term Goals - 01/24/20 1736      PT SHORT TERM GOAL #1   Title indepdndent with initial HEP    Time 2    Period Weeks    Status New             PT Long Term Goals - 03/09/20 1447      PT LONG TERM GOAL #1  Title go up and down stairs step over step    Status Achieved      PT LONG TERM GOAL #2   Title walk with a better gait, demonstrating a natural knee bend    Status Partially Met      PT LONG TERM GOAL #3   Title increase AROM to 5-115 degrees flexion    Status Partially Met      PT LONG TERM GOAL #4   Title decrease pain 50%    Status Achieved                 Plan - 03/13/20 1438    Clinical Impression Statement I tried some prone knee flexion stretches with contract relax in this position to gain flexion, worked on knee extension, she had some c/o pain and soreness after this so added vibration and estim    PT Next Visit Plan measure and write MD note, she will see MD next week    Consulted and Agree with Plan of Care Patient           Patient will benefit from skilled therapeutic intervention in order to improve the following deficits and impairments:  Abnormal gait, Decreased range of motion, Difficulty walking, Increased muscle spasms, Pain, Impaired flexibility, Decreased balance, Decreased mobility, Decreased strength, Increased edema  Visit  Diagnosis: Acute pain of right knee  Stiffness of right knee, not elsewhere classified  Difficulty in walking, not elsewhere classified     Problem List Patient Active Problem List   Diagnosis Date Noted  . S/P total knee replacement 02/23/2018  . Essential hypertension 08/08/2015  . Osteoporosis 06/15/2014    Sumner Boast., PT 03/13/2020, 2:40 PM  [address] [place], [place], [address] Phone: [phone]   Fax:  [phone]  Name: Lisa Wheeler MRN: [id number] Date of Birth: 12-18-1950

## 2020-03-15 ENCOUNTER — Ambulatory Visit: Payer: Medicare Other | Admitting: Physical Therapy

## 2020-03-15 ENCOUNTER — Other Ambulatory Visit: Payer: Self-pay

## 2020-03-15 ENCOUNTER — Encounter: Payer: Self-pay | Admitting: Physical Therapy

## 2020-03-15 DIAGNOSIS — R600 Localized edema: Secondary | ICD-10-CM

## 2020-03-15 DIAGNOSIS — R262 Difficulty in walking, not elsewhere classified: Secondary | ICD-10-CM

## 2020-03-15 DIAGNOSIS — M25661 Stiffness of right knee, not elsewhere classified: Secondary | ICD-10-CM

## 2020-03-15 DIAGNOSIS — M25561 Pain in right knee: Secondary | ICD-10-CM | POA: Diagnosis not present

## 2020-03-15 NOTE — Therapy (Signed)
[place], [place], [address] Phone: [phone]   Fax:  [phone]  Physical Therapy Treatment  Patient Details  Name: Lisa Wheeler MRN: [id number] Date of Birth: 02/04/1951 Referring Provider (PT): Lucey   Encounter Date: 03/15/2020   PT End of Session - 03/15/20 1437    Visit Number 16    PT Start Time 1517    PT Stop Time 1450    PT Time Calculation (min) 52 min    Activity Tolerance Patient tolerated treatment well    Behavior During Therapy [facility] for tasks assessed/performed           Past Medical History:  Diagnosis Date  . Arthritis   . Elevated lipids   . Hypertension   . Osteoarthritis of right knee    Severe  . Osteopenia    right hip    Past Surgical History:  Procedure Laterality Date  . CLOSED REDUCTION HAND FRACTURE    . OOPHORECTOMY     right  . TOTAL KNEE ARTHROPLASTY Right 02/23/2018   Procedure: TOTAL KNEE ARTHROPLASTY;  Surgeon: Vickey Huger, MD;  Location: [facility];  Service: Orthopedics;  Laterality: Right;  . TOTAL KNEE ARTHROPLASTY Left 06/01/2018   Procedure: LEFT TOTAL KNEE ARTHROPLASTY;  Surgeon: Vickey Huger, MD;  Location[facility];  Service: Orthopedics;  Laterality: Left;  Adductor Block  . TUBAL LIGATION  1985  . WRIST SURGERY  2/12    There were no vitals filed for this visit.   Subjective Assessment - 03/15/20 1400    Subjective Patient continues to report stiffness and swelling, has not had any locking up issues    Currently in Pain? Yes    Pain Score 2     Pain Location Knee    Pain Orientation Right    Aggravating Factors  stiff              [facility] PT Assessment - 03/15/20 0001      Circumferential Edema   Circumferential - Right 49 cm mid patella    Circumferential - Left  47 cm      AROM   Right Knee Extension 10    Right Knee Flexion 108      PROM   Right Knee Extension 7    Right Knee Flexion 110      Strength   Overall  Strength Comments 4+/5                         [facility] Adult PT Treatment/Exercise - 03/15/20 0001      Knee/Hip Exercises: Aerobic   Elliptical I=11, R=6 x 6 minutes    Recumbent Bike 6 minutes seat #6       Knee/Hip Exercises: Machines for Strengthening   Cybex Leg Press 20# then no weight working on going low, got to posistion #2 multiple times with toes even with top of footplate, then toes a little higher all the way to the bottom      Electrical Stimulation   Electrical Stimulation Location right knee due to c/o pain after PROM    Electrical Stimulation Action IFC    Electrical Stimulation Parameters supine    Electrical Stimulation Goals Pain      Manual Therapy   Manual Therapy Soft tissue mobilization;Passive ROM    Soft tissue mobilization to the quad , ITB area, the scar and some patellar mobilization, use of some vibration  Passive ROM passive flexion and extension, tried today in prone and some contract relax                  PT Education - 03/15/20 1436    Education Details Spoke to her about the gym, and how to go slow and not add too fast too much, she agreed and reports that she was doing that when this occurred.    Person(s) Educated Patient    Methods Explanation    Comprehension Verbalized understanding            PT Short Term Goals - 01/24/20 1736      PT SHORT TERM GOAL #1   Title indepdndent with initial HEP    Time 2    Period Weeks    Status New             PT Long Term Goals - 03/15/20 1440      PT LONG TERM GOAL #1   Title go up and down stairs step over step    Status Achieved      PT LONG TERM GOAL #2   Title walk with a better gait, demonstrating a natural knee bend    Status Achieved      PT LONG TERM GOAL #3   Title increase AROM to 5-115 degrees flexion    Status Partially Met      PT LONG TERM GOAL #4   Title decrease pain 50%    Status Achieved      PT LONG TERM GOAL #5   Title no episodes of  knee "locking" over a 4 week period    Status Achieved                 Plan - 03/15/20 1437    Clinical Impression Statement Patient has made good progress in her ROM, strength and her walking, she still reports stiffness and swelling.  She has returned to doing some gym exercises the elliptical and the bike and leg curls and extension.  We did talk about going slow ith this, she is frustrated with not knowing why this has happened 2x    PT Next Visit Plan will hold treatment    Consulted and Agree with Plan of Care Patient           Patient will benefit from skilled therapeutic intervention in order to improve the following deficits and impairments:  Abnormal gait, Decreased range of motion, Difficulty walking, Increased muscle spasms, Pain, Impaired flexibility, Decreased balance, Decreased mobility, Decreased strength, Increased edema  Visit Diagnosis: Acute pain of right knee  Stiffness of right knee, not elsewhere classified  Difficulty in walking, not elsewhere classified  Localized edema     Problem List Patient Active Problem List   Diagnosis Date Noted  . S/P total knee replacement 02/23/2018  . Essential hypertension 08/08/2015  . Osteoporosis 06/15/2014    Sumner Boast., PT 03/15/2020, 2:41 PM  [place] [address] [place], [place], [address] Phone: [phone]   Fax:  [phone]  Name: DIONA PEREGOY MRN: [id number] Date of Birth: [birth year]

## 2020-07-31 ENCOUNTER — Other Ambulatory Visit: Payer: Self-pay | Admitting: Obstetrics & Gynecology

## 2020-07-31 DIAGNOSIS — Z1231 Encounter for screening mammogram for malignant neoplasm of breast: Secondary | ICD-10-CM

## 2020-08-11 ENCOUNTER — Ambulatory Visit: Payer: Medicare Other | Admitting: Obstetrics & Gynecology

## 2020-09-04 DIAGNOSIS — Z9849 Cataract extraction status, unspecified eye: Secondary | ICD-10-CM | POA: Diagnosis not present

## 2020-09-13 ENCOUNTER — Ambulatory Visit: Payer: Medicare Other

## 2020-10-23 ENCOUNTER — Inpatient Hospital Stay: Admission: RE | Admit: 2020-10-23 | Payer: Medicare Other | Source: Ambulatory Visit

## 2020-10-27 ENCOUNTER — Encounter: Payer: Medicare Other | Admitting: Advanced Practice Midwife

## 2020-10-28 ENCOUNTER — Other Ambulatory Visit: Payer: Self-pay

## 2020-10-28 ENCOUNTER — Ambulatory Visit
Admission: RE | Admit: 2020-10-28 | Discharge: 2020-10-28 | Disposition: A | Discharge status: Home / Self Care | Payer: Medicare Other | Source: Ambulatory Visit | Attending: Obstetrics & Gynecology | Admitting: Obstetrics & Gynecology

## 2020-10-28 DIAGNOSIS — Z1231 Encounter for screening mammogram for malignant neoplasm of breast: Secondary | ICD-10-CM | POA: Diagnosis not present

## 2020-10-31 ENCOUNTER — Other Ambulatory Visit (HOSPITAL_COMMUNITY)
Admission: RE | Admit: 2020-10-31 | Discharge: 2020-10-31 | Disposition: A | Discharge status: Home / Self Care | Payer: Medicare Other | Source: Ambulatory Visit | Attending: Obstetrics & Gynecology | Admitting: Obstetrics & Gynecology

## 2020-10-31 ENCOUNTER — Encounter (HOSPITAL_BASED_OUTPATIENT_CLINIC_OR_DEPARTMENT_OTHER): Payer: Self-pay | Admitting: Obstetrics & Gynecology

## 2020-10-31 ENCOUNTER — Other Ambulatory Visit: Payer: Self-pay

## 2020-10-31 ENCOUNTER — Ambulatory Visit (INDEPENDENT_AMBULATORY_CARE_PROVIDER_SITE_OTHER): Payer: Medicare Other | Admitting: Obstetrics & Gynecology

## 2020-10-31 VITALS — BP 124/68 | HR 77 | Ht 67.5 in | Wt 183.4 lb

## 2020-10-31 DIAGNOSIS — Z124 Encounter for screening for malignant neoplasm of cervix: Secondary | ICD-10-CM | POA: Insufficient documentation

## 2020-10-31 DIAGNOSIS — Z01419 Encounter for gynecological examination (general) (routine) without abnormal findings: Secondary | ICD-10-CM | POA: Diagnosis not present

## 2020-10-31 DIAGNOSIS — Z803 Family history of malignant neoplasm of breast: Secondary | ICD-10-CM

## 2020-10-31 DIAGNOSIS — I10 Essential (primary) hypertension: Secondary | ICD-10-CM

## 2020-10-31 DIAGNOSIS — M810 Age-related osteoporosis without current pathological fracture: Secondary | ICD-10-CM

## 2020-10-31 NOTE — Progress Notes (Signed)
70 y.o. G2P2 Married White or Caucasian female here for breast and pelvic exam.  I am also following her for osteoporosis.  She does not want treatment for this and declines another test at this time.  Denies vaginal bleeding.  Patient's last menstrual period was 09/02/1998.          Sexually active: Yes.    H/O STD:  no  Health Maintenance: PCP:  Dr. Valentina Lucks.  Last wellness appt was 02/2020.  Did blood work at that appt: yes Vaccines are up to date:  yes Colonoscopy:  declines but doing occult blood test with Dr. Valentina Lucks yearly MMG:  10/28/2020 normal BMD:  2015.  Does not desire to repeat Last pap smear:  no.   H/o abnormal pap smear:  no   reports that she has never smoked. She has never used smokeless tobacco. She reports that she does not drink alcohol and does not use drugs.  Past Medical History:  Diagnosis Date  . Arthritis   . Elevated lipids   . Hypertension   . Osteoarthritis of right knee    Severe  . Osteopenia    right hip    Past Surgical History:  Procedure Laterality Date  . cataract surgery     both eyes 08/2020  . CLOSED REDUCTION HAND FRACTURE    . OOPHORECTOMY     right  . TOTAL KNEE ARTHROPLASTY Right 02/23/2018   Procedure: TOTAL KNEE ARTHROPLASTY;  Surgeon: Dannielle Huh, MD;  [facility];  Service: Orthopedics;  Laterality: Right;  . TOTAL KNEE ARTHROPLASTY Left 06/01/2018   Procedure: LEFT TOTAL KNEE ARTHROPLASTY;  Surgeon: Dannielle Huh, MD;  Location: [facility];  Service: Orthopedics;  Laterality: Left;  Adductor Block  . TUBAL LIGATION  1985  . WRIST SURGERY  2/12    Current Outpatient Medications  Medication Sig Dispense Refill  . Ascorbic Acid (VITAMIN C) 500 MG CAPS Take by mouth daily.    . AZOR 10-40 MG per tablet Take 1 tablet by mouth daily.     Marland Kitchen BIOTIN PO Take 2,000 mcg by mouth daily.    . calcium-vitamin D (OSCAL WITH D) 500-200 MG-UNIT tablet Take 1 tablet by mouth daily.    . cholecalciferol (VITAMIN D) 1000 units tablet Take 1,000  Units by mouth daily.     No current facility-administered medications for this visit.    Family History  Problem Relation Age of Onset  . Breast cancer Mother 52  . Stomach cancer Father   . Heart disease Father     Review of Systems  All other systems reviewed and are negative.   Exam:   BP 124/68   Pulse 77   Ht 5' 7.5" (1.715 m)   Wt 183 lb 6.4 oz (83.2 kg)   LMP 09/02/1998   SpO2 98%   BMI 28.30 kg/m   Height: 5' 7.5" (171.5 cm)  General appearance: alert, cooperative and appears stated age Breasts: normal appearance, no masses or tenderness Abdomen: soft, non-tender; bowel sounds normal; no masses,  no organomegaly Lymph nodes: Cervical, supraclavicular, and axillary nodes normal.  No abnormal inguinal nodes palpated Neurologic: Grossly normal  Pelvic: External genitalia:  no lesions              Urethra:  normal appearing urethra with no masses, tenderness or lesions              Bartholins and Skenes: normal  Vagina: normal appearing vagina with normal color and discharge, no lesions              Cervix: no lesions              Pap taken: Yes.   Bimanual Exam:  Uterus:  normal size, contour, position, consistency, mobility, non-tender              Adnexa: normal adnexa and no mass, fullness, tenderness               Rectovaginal: Confirms               Anus:  normal sphincter tone, no lesions  Chaperone, Beola Cord, CMA, was present for exam.  Assessment/Plan: 1. Encntr for gyn exam (general) (routine) w/o abn findings - pap smear obtained today.  Shared decision making about pap smear guidelines discussed.  Pt comfortable stopping at age 43 but wants to continue until that time.  Will plan to see again in 2 years unless new issues/concern occurs - MMG 10/28/20 - colonoscopy again declines.  Is doing stool occult blood testing yearly with Dr. Valentina Lucks - pt declines having another BMD -vaccines reviewed - blood work done with Dr.  Valentina Lucks  2. Age-related osteoporosis without current pathological fracture - pt declines treatment or repeat BMD  3. Essential hypertension  4. Family history of breast cancer in mother  21 minutes of total time was spent for this patient encounter, including preparation, face-to-face counseling with the patient and coordination of care, and documentation of the encounter.

## 2020-11-01 ENCOUNTER — Encounter (HOSPITAL_BASED_OUTPATIENT_CLINIC_OR_DEPARTMENT_OTHER): Payer: Self-pay | Admitting: Obstetrics & Gynecology

## 2020-11-01 LAB — CYTOLOGY - PAP: Diagnosis: NEGATIVE

## 2021-01-26 DIAGNOSIS — R42 Dizziness and giddiness: Secondary | ICD-10-CM | POA: Diagnosis not present

## 2021-02-13 DIAGNOSIS — Z1389 Encounter for screening for other disorder: Secondary | ICD-10-CM | POA: Diagnosis not present

## 2021-02-13 DIAGNOSIS — I10 Essential (primary) hypertension: Secondary | ICD-10-CM | POA: Diagnosis not present

## 2021-02-13 DIAGNOSIS — Z0000 Encounter for general adult medical examination without abnormal findings: Secondary | ICD-10-CM | POA: Diagnosis not present

## 2021-02-13 DIAGNOSIS — M810 Age-related osteoporosis without current pathological fracture: Secondary | ICD-10-CM | POA: Diagnosis not present

## 2021-06-01 DIAGNOSIS — E7800 Pure hypercholesterolemia, unspecified: Secondary | ICD-10-CM | POA: Diagnosis not present

## 2021-06-01 DIAGNOSIS — R55 Syncope and collapse: Secondary | ICD-10-CM | POA: Diagnosis not present

## 2021-06-06 ENCOUNTER — Other Ambulatory Visit: Payer: Self-pay | Admitting: Internal Medicine

## 2021-06-06 DIAGNOSIS — I6523 Occlusion and stenosis of bilateral carotid arteries: Secondary | ICD-10-CM | POA: Diagnosis not present

## 2021-06-06 DIAGNOSIS — R55 Syncope and collapse: Secondary | ICD-10-CM | POA: Diagnosis not present

## 2021-06-06 DIAGNOSIS — I471 Supraventricular tachycardia: Secondary | ICD-10-CM | POA: Diagnosis not present

## 2021-06-25 ENCOUNTER — Ambulatory Visit
Admission: RE | Admit: 2021-06-25 | Discharge: 2021-06-25 | Disposition: A | Discharge status: Home / Self Care | Payer: Medicare Other | Source: Ambulatory Visit | Attending: Internal Medicine | Admitting: Internal Medicine

## 2021-06-25 ENCOUNTER — Other Ambulatory Visit: Payer: Self-pay

## 2021-06-25 DIAGNOSIS — R55 Syncope and collapse: Secondary | ICD-10-CM

## 2021-06-25 DIAGNOSIS — I6782 Cerebral ischemia: Secondary | ICD-10-CM | POA: Diagnosis not present

## 2021-06-25 MED ORDER — GADOBENATE DIMEGLUMINE 529 MG/ML IV SOLN
15.0000 mL | Freq: Once | INTRAVENOUS | Status: AC | PRN
  Administered 2021-06-25: 15 mL via INTRAVENOUS

## 2021-07-04 DIAGNOSIS — I10 Essential (primary) hypertension: Secondary | ICD-10-CM | POA: Diagnosis not present

## 2021-07-04 DIAGNOSIS — I471 Supraventricular tachycardia: Secondary | ICD-10-CM | POA: Diagnosis not present

## 2021-07-04 DIAGNOSIS — R55 Syncope and collapse: Secondary | ICD-10-CM | POA: Diagnosis not present

## 2021-07-09 DIAGNOSIS — H02834 Dermatochalasis of left upper eyelid: Secondary | ICD-10-CM | POA: Diagnosis not present

## 2021-07-09 DIAGNOSIS — Z961 Presence of intraocular lens: Secondary | ICD-10-CM | POA: Diagnosis not present

## 2021-07-09 DIAGNOSIS — H02831 Dermatochalasis of right upper eyelid: Secondary | ICD-10-CM | POA: Diagnosis not present

## 2021-07-09 DIAGNOSIS — H5203 Hypermetropia, bilateral: Secondary | ICD-10-CM | POA: Diagnosis not present

## 2021-07-09 DIAGNOSIS — H524 Presbyopia: Secondary | ICD-10-CM | POA: Diagnosis not present

## 2021-07-09 DIAGNOSIS — H52223 Regular astigmatism, bilateral: Secondary | ICD-10-CM | POA: Diagnosis not present

## 2021-08-13 DIAGNOSIS — R55 Syncope and collapse: Secondary | ICD-10-CM | POA: Diagnosis not present

## 2021-08-13 DIAGNOSIS — I10 Essential (primary) hypertension: Secondary | ICD-10-CM | POA: Diagnosis not present

## 2021-09-18 ENCOUNTER — Other Ambulatory Visit: Payer: Self-pay | Admitting: Obstetrics & Gynecology

## 2021-09-18 DIAGNOSIS — Z1231 Encounter for screening mammogram for malignant neoplasm of breast: Secondary | ICD-10-CM

## 2021-10-29 ENCOUNTER — Ambulatory Visit
Admission: RE | Admit: 2021-10-29 | Discharge: 2021-10-29 | Disposition: A | Discharge status: Home / Self Care | Payer: Medicare Other | Source: Ambulatory Visit | Attending: Obstetrics & Gynecology | Admitting: Obstetrics & Gynecology

## 2021-10-29 ENCOUNTER — Ambulatory Visit: Payer: Medicare Other

## 2021-10-29 DIAGNOSIS — Z1231 Encounter for screening mammogram for malignant neoplasm of breast: Secondary | ICD-10-CM

## 2022-01-24 ENCOUNTER — Encounter (HOSPITAL_BASED_OUTPATIENT_CLINIC_OR_DEPARTMENT_OTHER): Payer: Self-pay | Admitting: Obstetrics & Gynecology

## 2022-01-24 ENCOUNTER — Ambulatory Visit (HOSPITAL_BASED_OUTPATIENT_CLINIC_OR_DEPARTMENT_OTHER): Payer: Medicare Other | Admitting: Obstetrics & Gynecology

## 2022-01-24 VITALS — BP 128/80 | HR 62 | Ht 67.5 in | Wt 189.0 lb

## 2022-01-24 DIAGNOSIS — N8111 Cystocele, midline: Secondary | ICD-10-CM | POA: Diagnosis not present

## 2022-01-24 DIAGNOSIS — Z4689 Encounter for fitting and adjustment of other specified devices: Secondary | ICD-10-CM

## 2022-01-24 DIAGNOSIS — Z960 Presence of urogenital implants: Secondary | ICD-10-CM

## 2022-01-24 NOTE — Progress Notes (Signed)
GYNECOLOGY  VISIT  CC:   prolapse  HPI: 71 y.o. G2P2 Married White or Caucasian female here for about 6 month history of noticing some vaginal prolapse.  Reports this started after having a significant URI with a lot of coughing.  Has gotten worse over the last six months.  Is feeling prolapse almost all of the time when up or walking.  Denies urinary leakage.  Is having some urinary urgency.  Gets up once a night to void at night but this isn't bad.   Past Medical History:  Diagnosis Date   Arthritis    Elevated lipids    Hypertension    Osteoarthritis of right knee    Severe   Osteopenia    right hip    MEDS:   Current Outpatient Medications on File Prior to Visit  Medication Sig Dispense Refill   Ascorbic Acid (VITAMIN C) 500 MG CAPS Take by mouth daily.     AZOR 10-40 MG per tablet Take 1 tablet by mouth daily.      BIOTIN PO Take 2,000 mcg by mouth daily.     calcium-vitamin D (OSCAL WITH D) 500-200 MG-UNIT tablet Take 1 tablet by mouth daily.     carvedilol (COREG) 12.5 MG tablet Take 12.5 mg by mouth 2 (two) times daily.     cholecalciferol (VITAMIN D) 1000 units tablet Take 1,000 Units by mouth daily.     No current facility-administered medications on file prior to visit.    ALLERGIES: Mercury, Methocarbamol, and Benzoin  SH:  married, non smoker  Review of Systems  Constitutional: Negative.   Genitourinary:  Negative for dysuria, frequency and urgency.   PHYSICAL EXAMINATION:    BP 128/80   Pulse 62   Ht 5' 7.5" (1.715 m)   Wt 189 lb (85.7 kg)   LMP 09/02/1998   BMI 29.16 kg/m     General appearance: alert, cooperative and appears stated age Lymph:  no inguinal LAD noted  Pelvic: External genitalia:  no lesions              Urethra:  normal appearing urethra with no masses, tenderness or lesions              Bartholins and Skenes: normal                 Vagina: normal appearing vagina with normal color and discharge, 3rd degree cystocele               Cervix: no lesions              Bimanual Exam:  Uterus:  normal size, contour, position, consistency, mobility, non-tender              Adnexa: no mass, fullness, tenderness  Options for treatment discussed including pessary use, surgical correction, and physical therapy.  Pt would like to try a pessary.  Fitting done today.  #3 incontinence ring with support placed with good fit.  Pt have to void easily and with Valsalva maneuver could not expel pessary.  Fitting pessary changed to pessary for her use.  She was able to remove and replace it as well.   Chaperone, Raechel Ache, RN, was present for exam.  Assessment/Plan:  1. Cystocele, midline - Pt would like some additional information about surgery and recovery for her decision making.  She is pleased with the pessary today but still may want this corrected - Ambulatory referral to Urogynecology  2. Encounter for fitting and adjustment  of pessary - pt can remove and replace her pessary so she will do this every two weeks.  She will cleanse it, leave out overnight and then replace.  Pt aware to remove with intercourse.  Also, she was advised to call with any new discharge or bleeding and with any new issues/concerns  3. Presence of pessary - Ambulatory referral to Urogynecology

## 2022-01-25 DIAGNOSIS — Z960 Presence of urogenital implants: Secondary | ICD-10-CM | POA: Insufficient documentation

## 2022-01-25 DIAGNOSIS — N8111 Cystocele, midline: Secondary | ICD-10-CM | POA: Insufficient documentation

## 2022-02-14 DIAGNOSIS — I10 Essential (primary) hypertension: Secondary | ICD-10-CM | POA: Diagnosis not present

## 2022-02-14 DIAGNOSIS — Z0000 Encounter for general adult medical examination without abnormal findings: Secondary | ICD-10-CM | POA: Diagnosis not present

## 2022-02-14 DIAGNOSIS — I471 Supraventricular tachycardia: Secondary | ICD-10-CM | POA: Diagnosis not present

## 2022-02-14 DIAGNOSIS — M810 Age-related osteoporosis without current pathological fracture: Secondary | ICD-10-CM | POA: Diagnosis not present

## 2022-02-24 ENCOUNTER — Other Ambulatory Visit: Payer: Self-pay

## 2022-02-24 ENCOUNTER — Emergency Department (HOSPITAL_COMMUNITY): Payer: Medicare Other

## 2022-02-24 ENCOUNTER — Observation Stay (HOSPITAL_COMMUNITY)
Admission: EM | Admit: 2022-02-24 | Discharge: 2022-02-25 | Disposition: A | Discharge status: Home / Self Care | Payer: Medicare Other | Attending: Internal Medicine | Admitting: Internal Medicine

## 2022-02-24 DIAGNOSIS — I10 Essential (primary) hypertension: Secondary | ICD-10-CM | POA: Diagnosis present

## 2022-02-24 DIAGNOSIS — I119 Hypertensive heart disease without heart failure: Secondary | ICD-10-CM | POA: Diagnosis not present

## 2022-02-24 DIAGNOSIS — R29898 Other symptoms and signs involving the musculoskeletal system: Secondary | ICD-10-CM | POA: Diagnosis not present

## 2022-02-24 DIAGNOSIS — R4701 Aphasia: Secondary | ICD-10-CM | POA: Diagnosis not present

## 2022-02-24 DIAGNOSIS — R5383 Other fatigue: Secondary | ICD-10-CM | POA: Diagnosis not present

## 2022-02-24 DIAGNOSIS — R202 Paresthesia of skin: Secondary | ICD-10-CM | POA: Insufficient documentation

## 2022-02-24 DIAGNOSIS — I639 Cerebral infarction, unspecified: Secondary | ICD-10-CM | POA: Diagnosis not present

## 2022-02-24 DIAGNOSIS — Z8249 Family history of ischemic heart disease and other diseases of the circulatory system: Secondary | ICD-10-CM | POA: Insufficient documentation

## 2022-02-24 DIAGNOSIS — G459 Transient cerebral ischemic attack, unspecified: Secondary | ICD-10-CM | POA: Diagnosis not present

## 2022-02-24 DIAGNOSIS — Z96653 Presence of artificial knee joint, bilateral: Secondary | ICD-10-CM | POA: Diagnosis not present

## 2022-02-24 DIAGNOSIS — R2990 Unspecified symptoms and signs involving the nervous system: Secondary | ICD-10-CM | POA: Diagnosis present

## 2022-02-24 DIAGNOSIS — M1711 Unilateral primary osteoarthritis, right knee: Secondary | ICD-10-CM | POA: Diagnosis not present

## 2022-02-24 DIAGNOSIS — E785 Hyperlipidemia, unspecified: Secondary | ICD-10-CM | POA: Insufficient documentation

## 2022-02-24 DIAGNOSIS — R482 Apraxia: Secondary | ICD-10-CM | POA: Insufficient documentation

## 2022-02-24 DIAGNOSIS — E871 Hypo-osmolality and hyponatremia: Secondary | ICD-10-CM | POA: Diagnosis not present

## 2022-02-24 DIAGNOSIS — I672 Cerebral atherosclerosis: Secondary | ICD-10-CM | POA: Diagnosis not present

## 2022-02-24 DIAGNOSIS — Z20822 Contact with and (suspected) exposure to covid-19: Secondary | ICD-10-CM | POA: Insufficient documentation

## 2022-02-24 DIAGNOSIS — I3139 Other pericardial effusion (noninflammatory): Secondary | ICD-10-CM | POA: Diagnosis not present

## 2022-02-24 DIAGNOSIS — R6889 Other general symptoms and signs: Secondary | ICD-10-CM | POA: Diagnosis not present

## 2022-02-24 DIAGNOSIS — R200 Anesthesia of skin: Secondary | ICD-10-CM | POA: Insufficient documentation

## 2022-02-24 DIAGNOSIS — K110 Atrophy of salivary gland: Secondary | ICD-10-CM | POA: Diagnosis not present

## 2022-02-24 DIAGNOSIS — H538 Other visual disturbances: Secondary | ICD-10-CM | POA: Diagnosis not present

## 2022-02-24 DIAGNOSIS — Z79899 Other long term (current) drug therapy: Secondary | ICD-10-CM | POA: Insufficient documentation

## 2022-02-24 DIAGNOSIS — I6523 Occlusion and stenosis of bilateral carotid arteries: Secondary | ICD-10-CM | POA: Diagnosis not present

## 2022-02-24 DIAGNOSIS — R29818 Other symptoms and signs involving the nervous system: Secondary | ICD-10-CM | POA: Diagnosis not present

## 2022-02-24 DIAGNOSIS — I6782 Cerebral ischemia: Secondary | ICD-10-CM | POA: Insufficient documentation

## 2022-02-24 DIAGNOSIS — M8580 Other specified disorders of bone density and structure, unspecified site: Secondary | ICD-10-CM | POA: Insufficient documentation

## 2022-02-24 DIAGNOSIS — M6281 Muscle weakness (generalized): Secondary | ICD-10-CM | POA: Diagnosis not present

## 2022-02-24 DIAGNOSIS — Z743 Need for continuous supervision: Secondary | ICD-10-CM | POA: Diagnosis not present

## 2022-02-24 LAB — COMPREHENSIVE METABOLIC PANEL
ALT: 18 U/L (ref 0–44)
AST: 21 U/L (ref 15–41)
Albumin: 3.7 g/dL (ref 3.5–5.0)
Alkaline Phosphatase: 61 U/L (ref 38–126)
Anion gap: 14 (ref 5–15)
BUN: 15 mg/dL (ref 8–23)
CO2: 19 mmol/L — ABNORMAL LOW (ref 22–32)
Calcium: 8.7 mg/dL — ABNORMAL LOW (ref 8.9–10.3)
Chloride: 98 mmol/L (ref 98–111)
Creatinine, Ser: 0.79 mg/dL (ref 0.44–1.00)
GFR, Estimated: >60 mL/min (ref >60)
Glucose, Bld: 105 mg/dL — ABNORMAL HIGH (ref 70–99)
Potassium: 3.7 mmol/L (ref 3.5–5.1)
Sodium: 131 mmol/L — ABNORMAL LOW (ref 135–145)
Total Bilirubin: 0.6 mg/dL (ref 0.3–1.2)
Total Protein: 6.4 g/dL — ABNORMAL LOW (ref 6.5–8.1)

## 2022-02-24 LAB — CBG MONITORING, ED: Glucose-Capillary: 117 mg/dL — ABNORMAL HIGH (ref 70–99)

## 2022-02-24 LAB — RESP PANEL BY RT-PCR (FLU A&B, COVID) ARPGX2
Influenza A by PCR: NEGATIVE
Influenza B by PCR: NEGATIVE
SARS Coronavirus 2 by RT PCR: NEGATIVE

## 2022-02-24 LAB — URINALYSIS, ROUTINE W REFLEX MICROSCOPIC
Bilirubin Urine: NEGATIVE
Glucose, UA: NEGATIVE mg/dL
Hgb urine dipstick: NEGATIVE
Ketones, ur: NEGATIVE mg/dL
Nitrite: NEGATIVE
Protein, ur: NEGATIVE mg/dL
Specific Gravity, Urine: 1.015 (ref 1.005–1.030)
pH: 7 (ref 5.0–8.0)

## 2022-02-24 LAB — DIFFERENTIAL
Abs Immature Granulocytes: 0.03 10*3/uL (ref 0.00–0.07)
Basophils Absolute: 0 10*3/uL (ref 0.0–0.1)
Basophils Relative: 1 %
Eosinophils Absolute: 0.2 10*3/uL (ref 0.0–0.5)
Eosinophils Relative: 2 %
Immature Granulocytes: 0 %
Lymphocytes Relative: 29 %
Lymphs Abs: 2.5 10*3/uL (ref 0.7–4.0)
Monocytes Absolute: 0.8 10*3/uL (ref 0.1–1.0)
Monocytes Relative: 9 %
Neutro Abs: 5.2 10*3/uL (ref 1.7–7.7)
Neutrophils Relative %: 59 %

## 2022-02-24 LAB — I-STAT CHEM 8, ED
BUN: 16 mg/dL (ref 8–23)
Calcium, Ion: 1.09 mmol/L — ABNORMAL LOW (ref 1.15–1.40)
Chloride: 100 mmol/L (ref 98–111)
Creatinine, Ser: 0.8 mg/dL (ref 0.44–1.00)
Glucose, Bld: 105 mg/dL — ABNORMAL HIGH (ref 70–99)
HCT: 44 % (ref 36.0–46.0)
Hemoglobin: 15 g/dL (ref 12.0–15.0)
Potassium: 3.7 mmol/L (ref 3.5–5.1)
Sodium: 134 mmol/L — ABNORMAL LOW (ref 135–145)
TCO2: 23 mmol/L (ref 22–32)

## 2022-02-24 LAB — CBC
HCT: 43.4 % (ref 36.0–46.0)
Hemoglobin: 14.2 g/dL (ref 12.0–15.0)
MCH: 28.7 pg (ref 26.0–34.0)
MCHC: 32.7 g/dL (ref 30.0–36.0)
MCV: 87.9 fL (ref 80.0–100.0)
Platelets: 224 10*3/uL (ref 150–400)
RBC: 4.94 MIL/uL (ref 3.87–5.11)
RDW: 13.2 % (ref 11.5–15.5)
WBC: 8.7 10*3/uL (ref 4.0–10.5)
nRBC: 0 % (ref 0.0–0.2)

## 2022-02-24 LAB — PROTIME-INR
INR: 1 (ref 0.8–1.2)
Prothrombin Time: 12.7 seconds (ref 11.4–15.2)

## 2022-02-24 LAB — ETHANOL: Alcohol, Ethyl (B): <10 mg/dL (ref <10)

## 2022-02-24 MED ORDER — IOHEXOL 350 MG/ML SOLN
75.0000 mL | Freq: Once | INTRAVENOUS | Status: AC | PRN
  Administered 2022-02-24: 75 mL via INTRAVENOUS

## 2022-02-24 NOTE — ED Notes (Signed)
Pt able to ambulate to the bathroom without assistance.  

## 2022-02-24 NOTE — ED Triage Notes (Signed)
Pt BIB from home for hypertension up to 210/120 with right arm weakness and numbness, expressive aphasia and blurred vision. LKW 1800 6/25.   Hx HTN  210/120 200100 192/110

## 2022-02-24 NOTE — ED Notes (Signed)
Patient transported to MRI 

## 2022-02-24 NOTE — ED Provider Notes (Signed)
Neurology recommends admission D/w Dr. Adela Glimpse for admission to hospitalist   Zadie Rhine, MD 02/24/22 2346

## 2022-02-25 ENCOUNTER — Observation Stay (HOSPITAL_COMMUNITY): Payer: Medicare Other

## 2022-02-25 ENCOUNTER — Observation Stay (HOSPITAL_BASED_OUTPATIENT_CLINIC_OR_DEPARTMENT_OTHER): Payer: Medicare Other

## 2022-02-25 DIAGNOSIS — I10 Essential (primary) hypertension: Secondary | ICD-10-CM | POA: Diagnosis not present

## 2022-02-25 DIAGNOSIS — G459 Transient cerebral ischemic attack, unspecified: Secondary | ICD-10-CM

## 2022-02-25 DIAGNOSIS — E871 Hypo-osmolality and hyponatremia: Secondary | ICD-10-CM | POA: Diagnosis present

## 2022-02-25 DIAGNOSIS — R4781 Slurred speech: Secondary | ICD-10-CM | POA: Diagnosis not present

## 2022-02-25 DIAGNOSIS — G43109 Migraine with aura, not intractable, without status migrainosus: Secondary | ICD-10-CM | POA: Diagnosis not present

## 2022-02-25 DIAGNOSIS — R2990 Unspecified symptoms and signs involving the nervous system: Secondary | ICD-10-CM | POA: Diagnosis not present

## 2022-02-25 DIAGNOSIS — I639 Cerebral infarction, unspecified: Secondary | ICD-10-CM | POA: Diagnosis not present

## 2022-02-25 LAB — RAPID URINE DRUG SCREEN, HOSP PERFORMED
Amphetamines: NOT DETECTED
Barbiturates: NOT DETECTED
Benzodiazepines: NOT DETECTED
Cocaine: NOT DETECTED
Opiates: NOT DETECTED
Tetrahydrocannabinol: NOT DETECTED

## 2022-02-25 LAB — CBC
HCT: 42.5 % (ref 36.0–46.0)
Hemoglobin: 14.4 g/dL (ref 12.0–15.0)
MCH: 29.5 pg (ref 26.0–34.0)
MCHC: 33.9 g/dL (ref 30.0–36.0)
MCV: 87.1 fL (ref 80.0–100.0)
Platelets: 207 10*3/uL (ref 150–400)
RBC: 4.88 MIL/uL (ref 3.87–5.11)
RDW: 13.2 % (ref 11.5–15.5)
WBC: 8.2 10*3/uL (ref 4.0–10.5)
nRBC: 0 % (ref 0.0–0.2)

## 2022-02-25 LAB — COMPREHENSIVE METABOLIC PANEL
ALT: 17 U/L (ref 0–44)
AST: 19 U/L (ref 15–41)
Albumin: 3.6 g/dL (ref 3.5–5.0)
Alkaline Phosphatase: 54 U/L (ref 38–126)
Anion gap: 10 (ref 5–15)
BUN: 12 mg/dL (ref 8–23)
CO2: 23 mmol/L (ref 22–32)
Calcium: 9 mg/dL (ref 8.9–10.3)
Chloride: 103 mmol/L (ref 98–111)
Creatinine, Ser: 0.72 mg/dL (ref 0.44–1.00)
GFR, Estimated: >60 mL/min (ref >60)
Glucose, Bld: 106 mg/dL — ABNORMAL HIGH (ref 70–99)
Potassium: 3.7 mmol/L (ref 3.5–5.1)
Sodium: 136 mmol/L (ref 135–145)
Total Bilirubin: 0.4 mg/dL (ref 0.3–1.2)
Total Protein: 6.3 g/dL — ABNORMAL LOW (ref 6.5–8.1)

## 2022-02-25 LAB — ECHOCARDIOGRAM COMPLETE
AR max vel: 2.42 cm2
AV Area VTI: 2.26 cm2
AV Area mean vel: 2.29 cm2
AV Mean grad: 5 mmHg
AV Peak grad: 9.9 mmHg
Ao pk vel: 1.57 m/s
Area-P 1/2: 3.46 cm2
Height: 67 in
MV M vel: 3.38 m/s
MV Peak grad: 45.7 mmHg
S' Lateral: 2.7 cm
Weight: 2960 oz

## 2022-02-25 LAB — CK: Total CK: 56 U/L (ref 38–234)

## 2022-02-25 LAB — MAGNESIUM: Magnesium: 2.1 mg/dL (ref 1.7–2.4)

## 2022-02-25 LAB — CREATININE, URINE, RANDOM: Creatinine, Urine: 25.98 mg/dL

## 2022-02-25 LAB — LIPID PANEL
Cholesterol: 176 mg/dL (ref 0–200)
HDL: 45 mg/dL (ref >40)
LDL Cholesterol: 123 mg/dL — ABNORMAL HIGH (ref 0–99)
Total CHOL/HDL Ratio: 3.9 RATIO
Triglycerides: 39 mg/dL (ref <150)
VLDL: 8 mg/dL (ref 0–40)

## 2022-02-25 LAB — HEMOGLOBIN A1C
Hgb A1c MFr Bld: 5.4 % (ref 4.8–5.6)
Mean Plasma Glucose: 108.28 mg/dL

## 2022-02-25 LAB — TSH: TSH: 1.904 u[IU]/mL (ref 0.350–4.500)

## 2022-02-25 LAB — PHOSPHORUS: Phosphorus: 3.8 mg/dL (ref 2.5–4.6)

## 2022-02-25 LAB — OSMOLALITY: Osmolality: 293 mOsm/kg (ref 275–295)

## 2022-02-25 LAB — OSMOLALITY, URINE: Osmolality, Ur: 272 mOsm/kg — ABNORMAL LOW (ref 300–900)

## 2022-02-25 LAB — SODIUM, URINE, RANDOM: Sodium, Ur: 51 mmol/L

## 2022-02-25 MED ORDER — CLOPIDOGREL BISULFATE 75 MG PO TABS: 75.0000 mg | ORAL_TABLET | Freq: Every day | ORAL | 0 refills | Status: DC

## 2022-02-25 MED ORDER — STROKE: EARLY STAGES OF RECOVERY BOOK: Freq: Once | Status: DC

## 2022-02-25 MED ORDER — ACETAMINOPHEN 160 MG/5ML PO SOLN: 650.0000 mg | ORAL | Status: DC | PRN

## 2022-02-25 MED ORDER — ACETAMINOPHEN 650 MG RE SUPP: 650.0000 mg | RECTAL | Status: DC | PRN

## 2022-02-25 MED ORDER — IPRATROPIUM-ALBUTEROL 0.5-2.5 (3) MG/3ML IN SOLN: 3.0000 mL | RESPIRATORY_TRACT | Status: DC | PRN

## 2022-02-25 MED ORDER — ASPIRIN 81 MG PO TBEC
81.0000 mg | DELAYED_RELEASE_TABLET | Freq: Every day | ORAL | Status: DC
  Administered 2022-02-25: 81 mg via ORAL
  Filled 2022-02-25: qty 1

## 2022-02-25 MED ORDER — TRAZODONE HCL 50 MG PO TABS: 50.0000 mg | ORAL_TABLET | Freq: Every evening | ORAL | Status: DC | PRN

## 2022-02-25 MED ORDER — ASPIRIN 81 MG PO TBEC: 81.0000 mg | DELAYED_RELEASE_TABLET | Freq: Every day | ORAL | 0 refills | Status: AC

## 2022-02-25 MED ORDER — ROSUVASTATIN CALCIUM 20 MG PO TABS: 20.0000 mg | ORAL_TABLET | Freq: Every day | ORAL | 0 refills | Status: DC

## 2022-02-25 MED ORDER — SODIUM CHLORIDE 0.9 % IV SOLN: INTRAVENOUS | Status: DC

## 2022-02-25 MED ORDER — VITAMIN C 500 MG PO TABS: 500.0000 mg | ORAL_TABLET | Freq: Every day | ORAL | Status: AC

## 2022-02-25 MED ORDER — SENNOSIDES-DOCUSATE SODIUM 8.6-50 MG PO TABS: 1.0000 | ORAL_TABLET | Freq: Every evening | ORAL | Status: DC | PRN

## 2022-02-25 MED ORDER — ROSUVASTATIN CALCIUM 20 MG PO TABS
20.0000 mg | ORAL_TABLET | Freq: Every day | ORAL | Status: DC
  Administered 2022-02-25: 20 mg via ORAL
  Filled 2022-02-25: qty 1

## 2022-02-25 MED ORDER — ACETAMINOPHEN 325 MG PO TABS: 650.0000 mg | ORAL_TABLET | ORAL | Status: DC | PRN

## 2022-02-25 MED ORDER — CLOPIDOGREL BISULFATE 75 MG PO TABS
75.0000 mg | ORAL_TABLET | Freq: Every day | ORAL | Status: DC
  Administered 2022-02-25: 75 mg via ORAL
  Filled 2022-02-25: qty 1

## 2022-02-25 NOTE — Procedures (Signed)
Patient Name: Lisa Wheeler  MRN: [id number]  Epilepsy Attending: Charlsie Quest  Referring Physician/Provider: Marvel Plan, MD  Date: 02/25/2022 Duration: 23.56 mins  Patient history: 71 y.o. female with history of HTN, HLD, OA, and osteopenia presenting after an episode of expressive aphasia and numbness in her right hand.  Patient reports that she had an episode of fluttering of her right eye lasting about 5 minutes and went away.  EEG to evaluate for seizure.  EEG to evaluate for seizure  Level of alertness: Awake  AEDs during EEG study: None  Technical aspects: This EEG study was done with scalp electrodes positioned according to the 10-20 International system of electrode placement. Electrical activity was acquired at a sampling rate of 500Hz  and reviewed with a high frequency filter of 70Hz  and a low frequency filter of 1Hz . EEG data were recorded continuously and digitally stored.   Description: The posterior dominant rhythm consists of 9 Hz activity of moderate voltage (25-35 uV) seen predominantly in posterior head regions, symmetric and reactive to eye opening and eye closing. Hyperventilation and photic stimulation were not performed.     IMPRESSION: This study is within normal limits. No seizures or epileptiform discharges were seen throughout the recording.  [name] Annabelle Harman

## 2022-02-25 NOTE — Assessment & Plan Note (Signed)
-   will admit based on TIA/CVA protocol        Monitor on Tele       MRA/MRI await results        CTA negative         Echo to evaluate for possible embolic source,        obtain cardiac enzymes,  ECG,   Lipid panel, TSH.        Order PT/OT evaluation.        keep nothing by mouth until passes swallow eval         Will make sure patient is on antiplatelet ASA 81 mg po daily   start statin if LDL > 70        Allow permissive Hypertension keep BP <220/120        Neurology consulted Have seen pt in ER

## 2022-03-11 DIAGNOSIS — Z8673 Personal history of transient ischemic attack (TIA), and cerebral infarction without residual deficits: Secondary | ICD-10-CM | POA: Diagnosis not present

## 2022-03-11 DIAGNOSIS — R197 Diarrhea, unspecified: Secondary | ICD-10-CM | POA: Diagnosis not present

## 2022-03-11 DIAGNOSIS — G459 Transient cerebral ischemic attack, unspecified: Secondary | ICD-10-CM | POA: Diagnosis not present

## 2022-03-11 DIAGNOSIS — I951 Orthostatic hypotension: Secondary | ICD-10-CM | POA: Diagnosis not present

## 2022-04-15 DIAGNOSIS — E7800 Pure hypercholesterolemia, unspecified: Secondary | ICD-10-CM | POA: Diagnosis not present

## 2022-04-15 DIAGNOSIS — I951 Orthostatic hypotension: Secondary | ICD-10-CM | POA: Diagnosis not present

## 2022-05-08 DIAGNOSIS — I951 Orthostatic hypotension: Secondary | ICD-10-CM | POA: Diagnosis not present

## 2022-05-08 DIAGNOSIS — E7800 Pure hypercholesterolemia, unspecified: Secondary | ICD-10-CM | POA: Diagnosis not present

## 2022-05-08 DIAGNOSIS — I10 Essential (primary) hypertension: Secondary | ICD-10-CM | POA: Diagnosis not present

## 2022-05-26 ENCOUNTER — Emergency Department (HOSPITAL_COMMUNITY)
Admission: EM | Admit: 2022-05-26 | Discharge: 2022-05-26 | Disposition: A | Discharge status: Home / Self Care | Payer: Medicare Other | Attending: Emergency Medicine | Admitting: Emergency Medicine

## 2022-05-26 ENCOUNTER — Emergency Department (HOSPITAL_COMMUNITY): Payer: Medicare Other

## 2022-05-26 ENCOUNTER — Other Ambulatory Visit: Payer: Self-pay

## 2022-05-26 ENCOUNTER — Encounter (HOSPITAL_COMMUNITY): Payer: Self-pay

## 2022-05-26 DIAGNOSIS — Z79899 Other long term (current) drug therapy: Secondary | ICD-10-CM | POA: Insufficient documentation

## 2022-05-26 DIAGNOSIS — R8281 Pyuria: Secondary | ICD-10-CM | POA: Diagnosis not present

## 2022-05-26 DIAGNOSIS — R002 Palpitations: Secondary | ICD-10-CM | POA: Insufficient documentation

## 2022-05-26 DIAGNOSIS — R4182 Altered mental status, unspecified: Secondary | ICD-10-CM | POA: Insufficient documentation

## 2022-05-26 DIAGNOSIS — R8271 Bacteriuria: Secondary | ICD-10-CM | POA: Insufficient documentation

## 2022-05-26 DIAGNOSIS — Z7982 Long term (current) use of aspirin: Secondary | ICD-10-CM | POA: Diagnosis not present

## 2022-05-26 DIAGNOSIS — R404 Transient alteration of awareness: Secondary | ICD-10-CM | POA: Diagnosis not present

## 2022-05-26 DIAGNOSIS — Z743 Need for continuous supervision: Secondary | ICD-10-CM | POA: Diagnosis not present

## 2022-05-26 DIAGNOSIS — R42 Dizziness and giddiness: Secondary | ICD-10-CM | POA: Insufficient documentation

## 2022-05-26 DIAGNOSIS — R110 Nausea: Secondary | ICD-10-CM | POA: Diagnosis not present

## 2022-05-26 DIAGNOSIS — Z7902 Long term (current) use of antithrombotics/antiplatelets: Secondary | ICD-10-CM | POA: Insufficient documentation

## 2022-05-26 LAB — CBC
HCT: 49.9 % — ABNORMAL HIGH (ref 36.0–46.0)
Hemoglobin: 16.3 g/dL — ABNORMAL HIGH (ref 12.0–15.0)
MCH: 29.2 pg (ref 26.0–34.0)
MCHC: 32.7 g/dL (ref 30.0–36.0)
MCV: 89.4 fL (ref 80.0–100.0)
Platelets: 260 10*3/uL (ref 150–400)
RBC: 5.58 MIL/uL — ABNORMAL HIGH (ref 3.87–5.11)
RDW: 13 % (ref 11.5–15.5)
WBC: 8.3 10*3/uL (ref 4.0–10.5)
nRBC: 0 % (ref 0.0–0.2)

## 2022-05-26 LAB — BASIC METABOLIC PANEL
Anion gap: 9 (ref 5–15)
BUN: 12 mg/dL (ref 8–23)
CO2: 24 mmol/L (ref 22–32)
Calcium: 9.3 mg/dL (ref 8.9–10.3)
Chloride: 105 mmol/L (ref 98–111)
Creatinine, Ser: 0.96 mg/dL (ref 0.44–1.00)
GFR, Estimated: >60 mL/min (ref >60)
Glucose, Bld: 152 mg/dL — ABNORMAL HIGH (ref 70–99)
Potassium: 4.8 mmol/L (ref 3.5–5.1)
Sodium: 138 mmol/L (ref 135–145)

## 2022-05-26 LAB — URINALYSIS, ROUTINE W REFLEX MICROSCOPIC
Bilirubin Urine: NEGATIVE
Glucose, UA: NEGATIVE mg/dL
Ketones, ur: 5 mg/dL — AB
Nitrite: NEGATIVE
Protein, ur: NEGATIVE mg/dL
Specific Gravity, Urine: 1.01 (ref 1.005–1.030)
pH: 7 (ref 5.0–8.0)

## 2022-05-26 MED ORDER — MECLIZINE HCL 12.5 MG PO TABS: 12.5000 mg | ORAL_TABLET | Freq: Three times a day (TID) | ORAL | 0 refills | Status: DC | PRN

## 2022-05-26 NOTE — ED Provider Notes (Signed)
[facility] EMERGENCY DEPARTMENT Provider Note   CSN: [id number] Arrival date & time: 05/26/22  1000     History  No chief complaint on file.  HPI Lisa Wheeler is a 71 y.o. female with history of TIA 3 months ago, syncope last year presenting for altered mental status and dizziness.  States that the dizziness has been going on for weeks now but at 4 AM this morning she woke up to go to the bathroom and was very lightheaded and dizzy lost her balance.  Denies falling or hitting her head or loss of consciousness but could no longer walk.  Denies room spinning sensation.  Few minutes later husband noticed that she was still talking but only able to respond with 1 or 2 words.  Also endorsed a history of syncope last year.  And recent history of gingivitis which she was just treated with a 10-day course of doxycycline which finished yesterday.  Patient states that since she has been on the doxycycline she has been intermittently nauseous.  But denies vomiting diarrhea and fever.  Denies headache.  Has taken a baby aspirin daily since her TIA 3 months ago.  Also mentioned heart palpitations that she is experienced in the last 90 days since her TIA.  Denies chest pain, shortness of breath, and abdominal pain.  HPI     Home Medications Prior to Admission medications   Medication Sig Start Date End Date Taking? Authorizing Provider  aspirin EC 81 MG tablet Take 1 tablet (81 mg total) by mouth daily. Swallow whole. 02/26/22   Amin, Ankit Chirag, MD  AZOR 10-40 MG per tablet Take 1 tablet by mouth at bedtime. 03/19/13   [provider]  BIOTIN PO Take 2,000 mcg by mouth daily.    [provider]  calcium-vitamin D (OSCAL WITH D) 500-200 MG-UNIT tablet Take 1 tablet by mouth daily.    [provider]  carvedilol (COREG) 12.5 MG tablet Take 12.5 mg by mouth 2 (two) times daily. 11/01/21   [provider]  cholecalciferol (VITAMIN D) 1000 units  tablet Take 1,000 Units by mouth daily.    [provider]  clopidogrel (PLAVIX) 75 MG tablet Take 1 tablet (75 mg total) by mouth daily. 02/26/22   Amin, Loura Halt, MD  meclizine (ANTIVERT) 12.5 MG tablet Take 1 tablet (12.5 mg total) by mouth 3 (three) times daily as needed for up to 10 doses for dizziness. 05/26/22   Gareth Eagle, PA-C  rosuvastatin (CRESTOR) 20 MG tablet Take 1 tablet (20 mg total) by mouth daily. 02/26/22   Amin, Loura Halt, MD  vitamin C (ASCORBIC ACID) 500 MG tablet Take 1 tablet (500 mg total) by mouth daily. 02/25/22   Dimple Nanas, MD      Allergies    Mercury, Methocarbamol, and Benzoin    Review of Systems   Review of Systems  Neurological:  Positive for dizziness, speech difficulty and light-headedness.    Physical Exam Updated Vital Signs BP (!) 141/95   Pulse 73   Temp 98 F (36.7 C) (Oral)   Resp 15   LMP 09/02/1998   SpO2 97%  Physical Exam Vitals and nursing note reviewed.  HENT:     Head: Normocephalic and atraumatic.     Mouth/Throat:     Mouth: Mucous membranes are moist.  Eyes:     General:        Right eye: No discharge.  Left eye: No discharge.     Conjunctiva/sclera: Conjunctivae normal.  Cardiovascular:     Rate and Rhythm: Normal rate and regular rhythm.     Pulses: Normal pulses.     Heart sounds: Normal heart sounds.  Pulmonary:     Effort: Pulmonary effort is normal.     Breath sounds: Normal breath sounds.  Abdominal:     General: Abdomen is flat.     Palpations: Abdomen is soft.  Skin:    General: Skin is warm and dry.  Neurological:     General: No focal deficit present.     Comments: GCS 15. Speech is goal oriented. No deficits appreciated to CN III-XII; symmetric eyebrow raise, no facial drooping, tongue midline. Patient has equal grip strength bilaterally with 5/5 strength against resistance in all major muscle groups bilaterally. Sensation to light touch intact. Patient moves extremities  without ataxia. Normal finger-nose-finger.  Could not assess gait patient refused due to concern for imbalance and fall.   Psychiatric:        Mood and Affect: Mood normal.     ED Results / Procedures / Treatments   Labs (all labs ordered are listed, but only abnormal results are displayed) Labs Reviewed  BASIC METABOLIC PANEL - Abnormal; Notable for the following components:      Result Value   Glucose, Bld 152 (*)    All other components within normal limits  CBC - Abnormal; Notable for the following components:   RBC 5.58 (*)    Hemoglobin 16.3 (*)    HCT 49.9 (*)    All other components within normal limits  URINALYSIS, ROUTINE W REFLEX MICROSCOPIC - Abnormal; Notable for the following components:   APPearance CLOUDY (*)    Hgb urine dipstick SMALL (*)    Ketones, ur 5 (*)    Leukocytes,Ua MODERATE (*)    Bacteria, UA RARE (*)    All other components within normal limits  CBG MONITORING, ED    EKG EKG Interpretation  Date/Time:  Sunday May 26 2022 11:04:06 EDT Ventricular Rate:  78 PR Interval:  145 QRS Duration: 102 QT Interval:  345 QTC Calculation: 391 R Axis:   26 Text Interpretation: Sinus rhythm Consider right atrial enlargement Nonspecific T abnrm, anterolateral leads Confirmed by Gwyneth Sprout ([id number]) on 05/26/2022 2:48:57 PM  Radiology MR Brain Wo Contrast (neuro protocol)  Result Date: 05/26/2022 CLINICAL DATA:  Dizziness, persistent/recurrent, cardiac or vascular cause suspected. EXAM: MRI HEAD WITHOUT CONTRAST TECHNIQUE: Multiplanar, multiecho pulse sequences of the brain and surrounding structures were obtained without intravenous contrast. COMPARISON:  MR head without contrast 02/25/2022. FINDINGS: Brain: No acute infarct, hemorrhage, or mass lesion is present. Scattered subcortical T2 hyperintensities bilaterally are mildly advanced for age, stable. The ventricles are of normal size. No significant extraaxial fluid collection is present. The  internal auditory canals are within normal limits. The brainstem and cerebellum are within normal limits. Vascular: Flow is present in the major intracranial arteries. Skull and upper cervical spine: The craniocervical junction is normal. Upper cervical spine is within normal limits. Marrow signal is unremarkable. Sinuses/Orbits: A left mastoid effusion is present. No obstructing nasopharyngeal lesion is present. The paranasal sinuses and mastoid air cells are clear. Bilateral lens replacements are noted. Globes and orbits are otherwise unremarkable. IMPRESSION: 1. No acute intracranial abnormality or significant interval change. 2. Scattered subcortical T2 hyperintensities bilaterally are mildly advanced for age. The finding is nonspecific but can be seen in the setting of chronic microvascular ischemia, a  demyelinating process such as multiple sclerosis, vasculitis, complicated migraine headaches, or as the sequelae of a prior infectious or inflammatory process. Electronically Signed   By: Marin Roberts M.D.   On: 05/26/2022 14:44    Procedures Procedures    Medications Ordered in ED Medications - No data to display  ED Course/ Medical Decision Making/ A&P                           Medical Decision Making Amount and/or Complexity of Data Reviewed Labs: ordered. Radiology: ordered.   This patient presents to the ED for concern of altered mental status and dizziness, this involves a number of treatment options, and is a complaint that carries with it a high risk risk of complications and morbidity.  The differential diagnosis includes cardiogenic cerebral hypoperfusion, TIA, hemorrhagic stroke, electrolyte derangement, infection, and peripheral vertigo.   Co morbidities: Discussed in HPI   EMR reviewed including pt PMHx, past surgical history and past visits to ER.   See HPI for more details   Lab Tests:   I ordered and independently interpreted labs. Labs notable  for   Imaging Studies:  Abnormal findings. I personally reviewed all imaging studies. Imaging notable for pyuria and bacteriuria.    Cardiac Monitoring:  The patient was maintained on a cardiac monitor.  I personally viewed and interpreted the cardiac monitored which showed an underlying rhythm of: NSR EKG non-ischemic   Medicines ordered: Offered meclizine for dizziness but patient declines stating that she was no longer dizzy. Reevaluation of the patient after these medicines showed that the patient improved I have reviewed the patients home medicines and have made adjustments as needed    Consults/Attending Physician   I discussed this case with my attending physician who cosigned this note including patient's presenting symptoms, physical exam, and planned diagnostics and interventions. Attending physician stated agreement with plan or made changes to plan which were implemented.   Reevaluation:  After the interventions noted above I re-evaluated patient and found that they have :improved.  Upon reevaluation patient stated that she was no longer dizzy and able to ambulate with out issue to the bathroom and to her room.    Problem List / ED Course: Presented for altered mental status and dizziness.  She also stated that she is unable to walk due to dizziness and lack of mobility since this morning.  Given recent history of TIA and ongoing use of aspirin was concern for possible posterior TIA versus hemorrhagic stroke.  Fortunately MRI was reassuring.  Urinalysis also revealed concern for possible UTI but patient denied any pertinent urinary symptoms.  We had a shared discussion conversation about rather not was to treat her asymptomatic bacteriuria and pyuria.  Patient declined and stated that she will follow-up with her PCP if she has any concerning urinary symptoms.  Also recommended that regarding her her work-up for dizziness altered mental status that she follow-up with her  PCP.  Discussed return precautions.  Ordered meclizine to be taken as needed for dizziness at home.    Dispostion:  After consideration of the diagnostic results and the patients response to treatment, I feel that the patent would benefit from          Final Clinical Impression(s) / ED Diagnoses Final diagnoses:  Dizziness    Rx / DC Orders ED Discharge Orders          Ordered    meclizine (ANTIVERT) 12.5 MG  tablet  3 times daily PRN,   Status:  Discontinued        05/26/22 1501    meclizine (ANTIVERT) 12.5 MG tablet  3 times daily PRN        05/26/22 1502              Gareth Eagle, PA-C 05/26/22 1511    Gwyneth Sprout, MD 05/29/22 1326

## 2022-05-26 NOTE — Discharge Instructions (Addendum)
Work-up for your dizziness and altered mental status today was overall reassuring.  MRI of your brain did not reveal any acute findings.  If you have new headache, new visual disturbance, changes in your gait, slurred speech please return to the emergency department for further evaluation.  UA did reveal concern for possible UTI but given that you have no pertinent urinary symptoms, it is appropriate to forego antibiotic treatment at this time.  Would recommend that you follow-up with your PCP if you have new painful urination, blood in urine and/or malodorous urine.

## 2022-05-26 NOTE — ED Notes (Signed)
Patient in MRI 

## 2022-05-26 NOTE — ED Triage Notes (Signed)
Patient arrived by [facility] from home with complaint of weakness. Just finished antibiotic for oral infection. Patient alert and oriented, no neuro deficits.

## 2022-06-10 DIAGNOSIS — E7800 Pure hypercholesterolemia, unspecified: Secondary | ICD-10-CM | POA: Diagnosis not present

## 2022-08-12 DIAGNOSIS — E7800 Pure hypercholesterolemia, unspecified: Secondary | ICD-10-CM | POA: Diagnosis not present

## 2022-08-12 DIAGNOSIS — Z79899 Other long term (current) drug therapy: Secondary | ICD-10-CM | POA: Diagnosis not present

## 2022-09-09 ENCOUNTER — Other Ambulatory Visit: Payer: Self-pay | Admitting: Obstetrics & Gynecology

## 2022-09-09 DIAGNOSIS — Z1231 Encounter for screening mammogram for malignant neoplasm of breast: Secondary | ICD-10-CM

## 2022-10-30 ENCOUNTER — Ambulatory Visit
Admission: RE | Admit: 2022-10-30 | Discharge: 2022-10-30 | Disposition: A | Discharge status: Home / Self Care | Payer: Medicare Other | Source: Ambulatory Visit | Attending: Obstetrics & Gynecology | Admitting: Obstetrics & Gynecology

## 2022-10-30 DIAGNOSIS — Z1231 Encounter for screening mammogram for malignant neoplasm of breast: Secondary | ICD-10-CM | POA: Diagnosis not present

## 2022-11-07 ENCOUNTER — Ambulatory Visit (INDEPENDENT_AMBULATORY_CARE_PROVIDER_SITE_OTHER): Payer: Medicare Other | Admitting: Obstetrics & Gynecology

## 2022-11-07 ENCOUNTER — Encounter (HOSPITAL_BASED_OUTPATIENT_CLINIC_OR_DEPARTMENT_OTHER): Payer: Self-pay | Admitting: Obstetrics & Gynecology

## 2022-11-07 VITALS — BP 102/63 | HR 77 | Ht 68.0 in | Wt 181.6 lb

## 2022-11-07 DIAGNOSIS — N8111 Cystocele, midline: Secondary | ICD-10-CM | POA: Diagnosis not present

## 2022-11-07 DIAGNOSIS — Z960 Presence of urogenital implants: Secondary | ICD-10-CM

## 2022-11-07 DIAGNOSIS — M810 Age-related osteoporosis without current pathological fracture: Secondary | ICD-10-CM

## 2022-11-07 DIAGNOSIS — G459 Transient cerebral ischemic attack, unspecified: Secondary | ICD-10-CM | POA: Diagnosis not present

## 2022-11-07 DIAGNOSIS — Z01419 Encounter for gynecological examination (general) (routine) without abnormal findings: Secondary | ICD-10-CM | POA: Diagnosis not present

## 2022-11-07 NOTE — Progress Notes (Signed)
72 y.o. G2P2 Married White or Caucasian female here for breast and pelvic exam.  I am also following her for cystocele.  She does have a pessary in place.  She's done well with this.  She is removing and replacing on her own each month.  Denies bleeding. With bowel movement, she does feel it move down but then she just pushes it back into place.  Feels like she empties completely.  Had TIA last June.  Reviewed hospital notes, lab work.  She's had full work up.  On simvastatin and azor and baby ASA.    Patient's last menstrual period was 09/02/1998.          Sexually active: Yes.    H/O STD:  no  Health Maintenance: PCP:  Dr .Louis Matte.  Will do blood work with her Vaccines are up to date:  reviewed with pt Colonoscopy:  declined doing any more colonoscopy tests, did guaiac test last June MMG:  10/30/2022 Negative BMD:  05/18/2014.  Declines for now Last pap smear:  10/31/2020 Negative.   H/o abnormal pap smear:  No    reports that she has never smoked. She has never used smokeless tobacco. She reports that she does not drink alcohol and does not use drugs.  Past Medical History:  Diagnosis Date   Arthritis    Elevated lipids    Hypertension    Osteoarthritis of right knee    Severe   Osteopenia    right hip    Past Surgical History:  Procedure Laterality Date   CATARACT EXTRACTION Bilateral    08/01/2020 and 08/08/2020   CLOSED REDUCTION HAND FRACTURE     OOPHORECTOMY     right   TOTAL KNEE ARTHROPLASTY Right 02/23/2018   Procedure: TOTAL KNEE ARTHROPLASTY;  Surgeon: Vickey Huger, MD;  Location: [facility];  Service: Orthopedics;  Laterality: Right;   TOTAL KNEE ARTHROPLASTY Left 06/01/2018   Procedure: LEFT TOTAL KNEE ARTHROPLASTY;  Surgeon: Vickey Huger, MD;  Location: [facility];  Service: Orthopedics;  Laterality: Left;  Adductor Block   TUBAL LIGATION  1985   WRIST SURGERY  2/12    Current Outpatient Medications  Medication Sig Dispense Refill   aspirin EC 81 MG tablet Take 1  tablet (81 mg total) by mouth daily. Swallow whole. 90 tablet 0   AZOR 10-40 MG per tablet Take 1 tablet by mouth at bedtime.     BIOTIN PO Take 2,000 mcg by mouth daily.     calcium-vitamin D (OSCAL WITH D) 500-200 MG-UNIT tablet Take 1 tablet by mouth daily.     cholecalciferol (VITAMIN D) 1000 units tablet Take 1,000 Units by mouth daily.     simvastatin (ZOCOR) 10 MG tablet Take 10 mg by mouth daily.     vitamin C (ASCORBIC ACID) 500 MG tablet Take 1 tablet (500 mg total) by mouth daily.     No current facility-administered medications for this visit.    Family History  Problem Relation Age of Onset   Breast cancer Mother 45   Stomach cancer Father    Heart disease Father     Review of Systems  Constitutional: Negative.   Genitourinary: Negative.     Exam:   BP 102/63 (BP Location: Left Arm, Patient Position: Sitting, Cuff Size: Large)   Pulse 77   Ht '5\' 8"'$  (1.727 m) Comment: Reported  Wt 181 lb 9.6 oz (82.4 kg)   LMP 09/02/1998   BMI 27.61 kg/m   Height: '5\' 8"'$  (172.7 cm) (  Reported)  General appearance: alert, cooperative and appears stated age Breasts: normal appearance, no masses or tenderness Abdomen: soft, non-tender; bowel sounds normal; no masses,  no organomegaly Lymph nodes: Cervical, supraclavicular, and axillary nodes normal.  No abnormal inguinal nodes palpated Neurologic: Grossly normal  Pelvic: External genitalia:  no lesions              Urethra:  normal appearing urethra with no masses, tenderness or lesions              Bartholins and Skenes: normal                 Vagina: normal appearing vagina with atrophic changes and no discharge, no lesions, pessary removed and cleansed and then replaced once pelvic exam was completed              Cervix: no lesions              Pap taken: No. Bimanual Exam:  Uterus:  normal size, contour, position, consistency, mobility, non-tender              Adnexa: normal adnexa and no mass, fullness, tenderness                Rectovaginal: Confirms               Anus:  normal sphincter tone, no lesions  Chaperone, Octaviano Batty, CMA, was present for exam.  Assessment/Plan: 1. Encntr for gyn exam (general) (routine) w/o abn findings - Pap smear neg 2022 - Mammogram 2024 - Colonoscopy declined - Bone mineral density declined - lab work done with PCP, Dr. Louis Matte - vaccines reviewed/updated  2. Cystocele, midline  3. Presence of pessary - pessary removed, cleansed and replaced today.  Good fit  4. Age-related osteoporosis without current pathological fracture  5. TIA (transient ischemic attack)

## 2022-11-25 DIAGNOSIS — E7800 Pure hypercholesterolemia, unspecified: Secondary | ICD-10-CM | POA: Diagnosis not present

## 2023-03-20 DIAGNOSIS — Z0000 Encounter for general adult medical examination without abnormal findings: Secondary | ICD-10-CM | POA: Diagnosis not present

## 2023-03-20 DIAGNOSIS — E7800 Pure hypercholesterolemia, unspecified: Secondary | ICD-10-CM | POA: Diagnosis not present

## 2023-03-20 DIAGNOSIS — I10 Essential (primary) hypertension: Secondary | ICD-10-CM | POA: Diagnosis not present

## 2023-03-20 DIAGNOSIS — Z79899 Other long term (current) drug therapy: Secondary | ICD-10-CM | POA: Diagnosis not present

## 2023-03-20 DIAGNOSIS — M810 Age-related osteoporosis without current pathological fracture: Secondary | ICD-10-CM | POA: Diagnosis not present

## 2023-03-20 DIAGNOSIS — Z8673 Personal history of transient ischemic attack (TIA), and cerebral infarction without residual deficits: Secondary | ICD-10-CM | POA: Diagnosis not present

## 2023-06-23 ENCOUNTER — Telehealth (HOSPITAL_BASED_OUTPATIENT_CLINIC_OR_DEPARTMENT_OTHER): Payer: Self-pay | Admitting: *Deleted

## 2023-06-23 NOTE — Telephone Encounter (Signed)
Patient call said she has a Prolase bladder  and pessary and is having bleeding and needs to be seen ASAP.

## 2023-06-24 NOTE — Telephone Encounter (Signed)
Returned pts call. Pt reports seeing some blood on pessary when removing this past Saturday. Pt reports that she has thin tissue and scratched herself when removing.  She has since then removed pessary again and there was no bleeding. Pt will continue to monitor and call for any further issues. She no longer feels that she needs to be seen at this time.

## 2023-07-14 ENCOUNTER — Other Ambulatory Visit (HOSPITAL_COMMUNITY)
Admission: RE | Admit: 2023-07-14 | Discharge: 2023-07-14 | Disposition: A | Discharge status: Home / Self Care | Payer: Medicare Other | Source: Ambulatory Visit | Attending: Obstetrics & Gynecology | Admitting: Obstetrics & Gynecology

## 2023-07-14 ENCOUNTER — Encounter (HOSPITAL_BASED_OUTPATIENT_CLINIC_OR_DEPARTMENT_OTHER): Payer: Self-pay | Admitting: Obstetrics & Gynecology

## 2023-07-14 ENCOUNTER — Ambulatory Visit (HOSPITAL_BASED_OUTPATIENT_CLINIC_OR_DEPARTMENT_OTHER): Payer: Medicare Other | Admitting: Obstetrics & Gynecology

## 2023-07-14 VITALS — BP 131/75 | HR 79 | Ht 66.5 in | Wt 181.0 lb

## 2023-07-14 DIAGNOSIS — Z960 Presence of urogenital implants: Secondary | ICD-10-CM

## 2023-07-14 DIAGNOSIS — N8111 Cystocele, midline: Secondary | ICD-10-CM

## 2023-07-14 DIAGNOSIS — Z01411 Encounter for gynecological examination (general) (routine) with abnormal findings: Secondary | ICD-10-CM | POA: Insufficient documentation

## 2023-07-14 DIAGNOSIS — N950 Postmenopausal bleeding: Secondary | ICD-10-CM | POA: Diagnosis not present

## 2023-07-14 NOTE — Progress Notes (Signed)
GYNECOLOGY  VISIT  CC:   pessary check, vaginal bleeding  HPI: 72 y.o. G2P2 Married White or Caucasian female here for concerns about her pessary coming down low at times and almost coming out.  She is able to push it right back up but more recently is having to do this more.  Pessary placement was done in march and as she's gotten more symptom relief, she's had more issues with the pessary coming down low.  She noted some bleeding in late October and thought maybe she had scratched herself.  She hasn't really noted any more bleeding but evaluation was recommended.  She is here for this today.  We did discussed up-sizing the pessary today as well.  She feels comfortable with this.     Past Medical History:  Diagnosis Date   Arthritis    Elevated lipids    Hypertension    Osteoarthritis of right knee    Severe   Osteopenia    right hip    MEDS:   Current Outpatient Medications on File Prior to Visit  Medication Sig Dispense Refill   aspirin EC 81 MG tablet Take 1 tablet (81 mg total) by mouth daily. Swallow whole. 90 tablet 0   AZOR 10-40 MG per tablet Take 1 tablet by mouth at bedtime.     BIOTIN PO Take 2,000 mcg by mouth daily.     calcium-vitamin D (OSCAL WITH D) 500-200 MG-UNIT tablet Take 1 tablet by mouth daily.     cholecalciferol (VITAMIN D) 1000 units tablet Take 1,000 Units by mouth daily.     simvastatin (ZOCOR) 10 MG tablet Take 10 mg by mouth daily.     vitamin C (ASCORBIC ACID) 500 MG tablet Take 1 tablet (500 mg total) by mouth daily.     No current facility-administered medications on file prior to visit.    ALLERGIES: Mercury, Methocarbamol, and Benzoin  SH:  married, non smoker  Review of Systems  Constitutional: Negative.   Genitourinary:        Vaginal bleeding    PHYSICAL EXAMINATION:    BP 131/75 (BP Location: Right Arm, Patient Position: Sitting, Cuff Size: Large)   Pulse 79   Ht 5' 6.5" (1.689 m)   Wt 181 lb (82.1 kg)   LMP 09/02/1998   BMI  28.78 kg/m     General appearance: alert, cooperative and appears stated age Lymph:  no inguinal LAD noted  Pelvic: External genitalia:  no lesions              Urethra:  normal appearing urethra with no masses, tenderness or lesions              Bartholins and Skenes: normal                 Vagina: normal mucosa without prolapse or lesions              Cervix: no lesions              Bimanual Exam:  Uterus:  normal size, contour, position, consistency, mobility, non-tender              Adnexa: no mass, fullness, tenderness              Pessary removed and there was blood on the pessary.  This was cleansed and pessary returned to pt.  Entire vaginal exam was completely normal and no evidence of blood.  Pap smear and endometrial biopsy recommended.  Consent obtained.  Procedure:  Speculum placed.  Cervix visualized.  Pap obtained.  Cervix then cleansed with betadine prep.  A single toothed tenaculum was not applied to the anterior lip of the cervix.  Endometrial pipelle was advanced through the cervix into the endometrial cavity without difficulty.  Pipelle passed to 7cm.  Suction applied and pipelle removed with creamy looking blood, almost like pyometra.  Second pass performed with blood and tissue present.  This did appear to be an adequate tissue sample.  Mild amount of bleeding from the cervix noted.  Patient tolerated procedure well.  All instruments removed.    Chaperone, Raechel Ache, RN, was present for exam.  Assessment/Plan: 1. Postmenopausal bleeding - endometrial biopsy and pap smear obtained today - Surgical pathology( [place]/ [facility]) - Cytology - PAP( [place])  2. Cystocele, midline - pt advised to leave pessary out for at least 24 hours or until bright red bleeding stops  3. Presence of pessary - #4 incontinence ring with support given to pt to start using this week, if possible.

## 2023-07-16 LAB — SURGICAL PATHOLOGY

## 2023-07-17 ENCOUNTER — Other Ambulatory Visit (HOSPITAL_BASED_OUTPATIENT_CLINIC_OR_DEPARTMENT_OTHER): Payer: Self-pay | Admitting: Obstetrics & Gynecology

## 2023-07-17 ENCOUNTER — Encounter (HOSPITAL_BASED_OUTPATIENT_CLINIC_OR_DEPARTMENT_OTHER): Payer: Self-pay | Admitting: Obstetrics & Gynecology

## 2023-07-17 MED ORDER — METRONIDAZOLE 500 MG PO TABS: 500.0000 mg | ORAL_TABLET | Freq: Two times a day (BID) | ORAL | 0 refills | Status: DC

## 2023-07-18 LAB — CYTOLOGY - PAP: Diagnosis: NEGATIVE

## 2023-08-06 ENCOUNTER — Ambulatory Visit (HOSPITAL_BASED_OUTPATIENT_CLINIC_OR_DEPARTMENT_OTHER): Payer: Medicare Other

## 2023-08-06 ENCOUNTER — Ambulatory Visit (HOSPITAL_BASED_OUTPATIENT_CLINIC_OR_DEPARTMENT_OTHER): Payer: Medicare Other | Admitting: Obstetrics & Gynecology

## 2023-08-06 VITALS — BP 133/71 | HR 80 | Ht 66.5 in | Wt 181.4 lb

## 2023-08-06 DIAGNOSIS — N950 Postmenopausal bleeding: Secondary | ICD-10-CM | POA: Diagnosis not present

## 2023-08-06 DIAGNOSIS — N8111 Cystocele, midline: Secondary | ICD-10-CM

## 2023-08-06 DIAGNOSIS — N719 Inflammatory disease of uterus, unspecified: Secondary | ICD-10-CM | POA: Diagnosis not present

## 2023-08-08 ENCOUNTER — Encounter (HOSPITAL_BASED_OUTPATIENT_CLINIC_OR_DEPARTMENT_OTHER): Payer: Self-pay | Admitting: Obstetrics & Gynecology

## 2023-08-08 NOTE — Progress Notes (Signed)
GYNECOLOGY  VISIT  CC:   follow up, ultrasound discussion  HPI: 72 y.o. G2P2 Married White or Caucasian female here for follow up after undergoing pelvic ultrasound today.  Had PMP and endometrial biopsy showing acute and chronic endometritis.  This was treated with antibiotics.  She completed the entire course.  Reports there was some bleeding for a few days after the biopsy but this stopped and there has been nothing since.  Denies any vaginal discharge or odor.  Uterus was normal.  Endometrium 2mm and thin.  Right ovary surgically absent.  Left ovary small.  No free fluid.    She has not replaced her pessary yet.  Feel this is reasonable at this point.  Showed images with incomplete bladder emptying.  She states she can tell this as well the the pessary not in place.   Past Medical History:  Diagnosis Date   Arthritis    Elevated lipids    Hypertension    Osteoarthritis of right knee    Severe   Osteopenia    right hip    MEDS:   Current Outpatient Medications on File Prior to Visit  Medication Sig Dispense Refill   metroNIDAZOLE (FLAGYL) 500 MG tablet Take 1 tablet (500 mg total) by mouth 2 (two) times daily. 14 tablet 0   aspirin EC 81 MG tablet Take 1 tablet (81 mg total) by mouth daily. Swallow whole. 90 tablet 0   AZOR 10-40 MG per tablet Take 1 tablet by mouth at bedtime.     BIOTIN PO Take 2,000 mcg by mouth daily.     calcium-vitamin D (OSCAL WITH D) 500-200 MG-UNIT tablet Take 1 tablet by mouth daily.     cholecalciferol (VITAMIN D) 1000 units tablet Take 1,000 Units by mouth daily.     simvastatin (ZOCOR) 10 MG tablet Take 10 mg by mouth daily.     vitamin C (ASCORBIC ACID) 500 MG tablet Take 1 tablet (500 mg total) by mouth daily.     No current facility-administered medications on file prior to visit.    ALLERGIES: Doxycycline, Mercury, Methocarbamol, and Benzoin  SH:  married, non smoker  Review of Systems  Constitutional: Negative.   Genitourinary:  Negative.     PHYSICAL EXAMINATION:    BP 133/71 (BP Location: Right Arm, Patient Position: Sitting, Cuff Size: Normal)   Pulse 80   Ht 5' 6.5" (1.689 m)   Wt 181 lb 6.4 oz (82.3 kg)   LMP 09/02/1998   BMI 28.84 kg/m     Physical Exam Constitutional:      Appearance: Normal appearance.  Neurological:     General: No focal deficit present.     Mental Status: She is alert.  Psychiatric:        Mood and Affect: Mood normal.        Behavior: Behavior normal.      Assessment/Plan: 1. Postmenopausal bleeding - bleeding has resolved but advised her to call with any need bleeding/discharge/concerns  2. Endometritis - pt was treated with flagyl 500mg  bid x 7 days.  3. Cystocele, midline - she is going to replace the pessary and remove/clean every 2 weeks  Total time with pt:  12 minutes Documentation:  5 minutes Total time:  17 minutes

## 2023-09-03 DIAGNOSIS — G20A1 Parkinson's disease without dyskinesia, without mention of fluctuations: Secondary | ICD-10-CM

## 2023-09-03 HISTORY — DX: Parkinson's disease without dyskinesia, without mention of fluctuations: G20.A1

## 2023-11-05 ENCOUNTER — Encounter: Payer: Self-pay | Admitting: Neurology

## 2023-11-05 DIAGNOSIS — G252 Other specified forms of tremor: Secondary | ICD-10-CM | POA: Diagnosis not present

## 2023-11-19 NOTE — Progress Notes (Unsigned)
 Assessment/Plan:   1.  Acute onset right upper extremity rest tremor  -I would go ahead and suggest that we do a repeat MRI of the brain given that patient notes that this started abruptly one day in October, 2024.  That is a bit unusual, but otherwise I do not see anything concerning on her examination today for a structural etiology.  Nonetheless, while it looks parkinsonian, a basal ganglia structural lesion could certainly cause an acute onset of tremor, and this needs to be ruled out.  Patient was agreeable.  -If the above is unremarkable, we will go ahead and do a DaTscan.  She and I talked about the fact that even if DaTscan was positive, she still does not meet the criteria for Parkinsons disease.  2.  History of TIA, June 2023  -Workup was nonrevealing.  3.  History of syncope  -Has had several episodes with unrevealing workup, last in September, 2023  Subjective:   Lisa Wheeler was seen today in the movement disorders clinic for neurologic consultation at the request of Thana Ates, MD.  This patient is accompanied in the office by her spouse who supplements the history. The consultation is for the evaluation of right hand rest tremor.  Outside records that were made available to me were reviewed.   Tremor: Yes.     How long has it been going on? Started acutely in October - "It started one day and didn't leave"  At rest or with activation?  rest  Fam hx of tremor?  No.  Located where?  R hand dominant  Affected by caffeine:  doesn't drink coffee  Affected by alcohol:  doesn't drink alcohol  Affected by stress:  Yes.    Affected by fatigue:  No.  Spills soup if on spoon:  No.  Spills glass of liquid if full:  No.   Other Specific Symptoms:  Voice: no change Sleep: sleeps well except nocturia  Vivid Dreams:  Yes.    Acting out dreams:  Yes.  , few times a month; never fallen OOB Wet Pillows: No. Postural symptoms:  No.  Falls?  No. (Last fall 2018) Bradykinesia  symptoms: no bradykinesia noted Loss of smell:  No. Loss of taste:  No. Urinary Incontinence:  uses pessary for prolapsed bladder Difficulty Swallowing:  No. Handwriting, micrographia: No. Trouble with ADL's:  No.  Trouble buttoning clothing: No. Depression:  No., but little anxious/worrier Memory changes:  No. Hallucinations:  No.  visual distortions: No. N/V:  No. Lightheaded:  No.  Syncope: Yes.  , "I've been known to pass out" - sometimes due to dehydration; one at a restaurant for unknown reason Diplopia:  No. Dyskinesia:  No.   Patient had MRI of the brain in September, 2023 after dizziness/syncope.  I personally reviewed the image.  There is mild to moderate white matter changes, particularly at the gray/white junction.  Hx of TIA in June 2023 - R sided paresthesias and nonsensical speech x 10 min.  Workup was negative.  PREVIOUS MEDICATIONS: none to date  ALLERGIES:   Allergies  Allergen Reactions   Doxycycline    Mercury     Migraines/ headaches due to dental fillings    Methocarbamol     Dizziness, syncope    Benzoin Rash    Makes skin peel    CURRENT MEDICATIONS:  Current Outpatient Medications  Medication Instructions   amLODipine-olmesartan (AZOR) 10-40 MG tablet 1 tablet, Daily   ascorbic acid (VITAMIN C) 500 mg,  Oral, Daily   aspirin EC 81 mg, Oral, Daily, Swallow whole.   AZOR 10-40 MG per tablet 1 tablet, Daily at bedtime   BIOTIN PO 2,000 mcg, Daily   calcium-vitamin D (OSCAL WITH D) 500-200 MG-UNIT tablet 1 tablet, Daily   cholecalciferol (VITAMIN D) 1,000 Units, Daily   simvastatin (ZOCOR) 10 mg, Daily    Objective:   PHYSICAL EXAMINATION:    VITALS:   Vitals:   11/21/23 1318  BP: 130/70  Pulse: 66  SpO2: 99%    GEN:  The patient appears stated age and is in NAD. HEENT:  Normocephalic, atraumatic.  The mucous membranes are moist. The superficial temporal arteries are without ropiness or tenderness. CV:  RRR Lungs:  CTAB Neck/HEME:   There are no carotid bruits bilaterally.  Neurological examination:  Orientation: The patient is alert and oriented x3.  Cranial nerves: There is good facial symmetry.  Extraocular muscles are intact. The visual fields are full to confrontational testing. The speech is fluent and clear. Soft palate rises symmetrically and there is no tongue deviation. Hearing is intact to conversational tone. Sensation: Sensation is intact to light touch throughout (facial, trunk, extremities). Vibration is intact at the bilateral big toe. There is no extinction with double simultaneous stimulation.  Motor: Strength is 5/5 in the bilateral upper and lower extremities.   Shoulder shrug is equal and symmetric.  There is no pronator drift. Deep tendon reflexes: Deep tendon reflexes are 2/4 at the bilateral biceps, triceps, brachioradialis, trace at the bilateral patella and achilles. Plantar responses are downgoing bilaterally.  Movement examination: Tone: There is nl tone in the bilateral upper extremities.  The tone in the lower extremities is nl.  Abnormal movements: there is RUE rest tremor that increases with distraction procedures Coordination:  There is no decremation with RAM's, with any form of RAMS, including alternating supination and pronation of the forearm, hand opening and closing, finger taps, heel taps and toe taps.  Gait and Station: The patient has no difficulty arising out of a deep-seated chair without the use of the hands. The patient's stride length is good but a bit antalgic with mild decreased arm swing on the right.   I have reviewed and interpreted the following labs independently   Chemistry      Component Value Date/Time   NA 138 05/26/2022 1013   K 4.8 05/26/2022 1013   CL 105 05/26/2022 1013   CO2 24 05/26/2022 1013   BUN 12 05/26/2022 1013   CREATININE 0.96 05/26/2022 1013      Component Value Date/Time   CALCIUM 9.3 05/26/2022 1013   ALKPHOS 54 02/25/2022 0223   AST 19  02/25/2022 0223   ALT 17 02/25/2022 0223   BILITOT 0.4 02/25/2022 0223      Lab Results  Component Value Date   TSH 1.904 02/25/2022   Lab Results  Component Value Date   WBC 8.3 05/26/2022   HGB 16.3 (H) 05/26/2022   HCT 49.9 (H) 05/26/2022   MCV 89.4 05/26/2022   PLT 260 05/26/2022      Total time spent on today's visit was 60 minutes, including both face-to-face time and nonface-to-face time.  Time included that spent on review of records (prior notes available to me/labs/imaging if pertinent), discussing treatment and goals, answering patient's questions and coordinating care.  Cc:  Thana Ates, MD

## 2023-11-21 ENCOUNTER — Ambulatory Visit: Admitting: Neurology

## 2023-11-21 ENCOUNTER — Encounter: Payer: Self-pay | Admitting: Neurology

## 2023-11-21 VITALS — BP 130/70 | HR 66

## 2023-11-21 DIAGNOSIS — R55 Syncope and collapse: Secondary | ICD-10-CM | POA: Diagnosis not present

## 2023-11-21 DIAGNOSIS — R251 Tremor, unspecified: Secondary | ICD-10-CM

## 2023-11-21 NOTE — Patient Instructions (Signed)
A referral to [facility] has been placed for your MRI someone will contact you directly to schedule your appt. They are located at [address] Please contact them directly by calling [phone] with any questions regarding your referral.  

## 2023-11-28 ENCOUNTER — Other Ambulatory Visit: Payer: Self-pay | Admitting: Obstetrics & Gynecology

## 2023-11-28 DIAGNOSIS — Z1231 Encounter for screening mammogram for malignant neoplasm of breast: Secondary | ICD-10-CM

## 2023-12-22 ENCOUNTER — Ambulatory Visit
Admission: RE | Admit: 2023-12-22 | Discharge: 2023-12-22 | Disposition: A | Discharge status: Home / Self Care | Source: Ambulatory Visit | Attending: Obstetrics & Gynecology | Admitting: Obstetrics & Gynecology

## 2023-12-22 DIAGNOSIS — Z1231 Encounter for screening mammogram for malignant neoplasm of breast: Secondary | ICD-10-CM

## 2024-01-03 ENCOUNTER — Ambulatory Visit
Admission: RE | Admit: 2024-01-03 | Discharge: 2024-01-03 | Disposition: A | Discharge status: Home / Self Care | Source: Ambulatory Visit | Attending: Neurology | Admitting: Neurology

## 2024-01-03 DIAGNOSIS — R251 Tremor, unspecified: Secondary | ICD-10-CM | POA: Diagnosis not present

## 2024-01-03 DIAGNOSIS — I6782 Cerebral ischemia: Secondary | ICD-10-CM | POA: Diagnosis not present

## 2024-01-03 MED ORDER — GADOPICLENOL 0.5 MMOL/ML IV SOLN
10.0000 mL | Freq: Once | INTRAVENOUS | Status: AC | PRN
  Administered 2024-01-03: 10 mL via INTRAVENOUS

## 2024-01-08 ENCOUNTER — Ambulatory Visit (HOSPITAL_BASED_OUTPATIENT_CLINIC_OR_DEPARTMENT_OTHER): Payer: Medicare Other | Admitting: Obstetrics & Gynecology

## 2024-01-12 ENCOUNTER — Other Ambulatory Visit: Payer: Self-pay

## 2024-01-12 DIAGNOSIS — R251 Tremor, unspecified: Secondary | ICD-10-CM

## 2024-02-06 ENCOUNTER — Encounter (HOSPITAL_COMMUNITY)
Admission: RE | Admit: 2024-02-06 | Discharge: 2024-02-06 | Disposition: A | Discharge status: Home / Self Care | Source: Ambulatory Visit | Attending: Neurology | Admitting: Neurology

## 2024-02-06 DIAGNOSIS — R251 Tremor, unspecified: Secondary | ICD-10-CM | POA: Diagnosis not present

## 2024-02-06 MED ORDER — POTASSIUM IODIDE (ANTIDOTE) 130 MG PO TABS
ORAL_TABLET | ORAL | Status: AC
  Filled 2024-02-06: qty 1

## 2024-02-06 MED ORDER — IOFLUPANE I 123 185 MBQ/2.5ML IV SOLN
4.8000 | Freq: Once | INTRAVENOUS | Status: AC | PRN
  Administered 2024-02-06: 4.8 via INTRAVENOUS
  Filled 2024-02-06: qty 5

## 2024-02-09 ENCOUNTER — Ambulatory Visit: Payer: Self-pay

## 2024-02-09 NOTE — Telephone Encounter (Signed)
-----   Message from Purple Sage Tat sent at 02/08/2024 12:01 PM EDT ----- Please make her an appt on wed at 9:15 to review results ----- Message ----- From: Interface, Rad Results In Sent: 02/06/2024   3:48 PM EDT To: Von Grumbling Tat, DO

## 2024-02-09 NOTE — Telephone Encounter (Signed)
 Pt said she can't do this week, it will need to be next week. Do you have a day/time for her?

## 2024-02-10 NOTE — Progress Notes (Unsigned)
 Assessment/Plan:   1.  Parkinsonism  -DaTscan  done in June, 2025 with significant decreased activity in the basal ganglia, left greater than right (left putamen).  - She really is not particularly bradykinetic, but with her history of syncopal episodes, one cannot rule out an atypical state such as MSA.  However, I do tend to think that this is more idiopathic Parkinsons disease than MSA.  She reports longstanding history (childhood included) of syncopal episodes.  In addition, she is a little bit out of the age range for the diagnosis of MSA, but that does not necessarily mean it is not possibility.  This is something that time will certainly help us  to elucidate better, as both Parkinson's disease and MSA will have skin biopsies that are positive for alpha-synuclein.  Both disease states are effectively treated for the same.  - We discussed levodopa in detail (including a long discussion about risk, benefits, side effects), but ultimately she decided to hold off on any medication for now.  We discussed the importance of safe, cardiovascular exercise and exactly what that meant.  - They asked multiple questions and I answered those to the best of my ability.  - Second opinions offered.  - Follow-up 6 months   Subjective:   Lisa Wheeler was seen today in follow up for test results.  Her husband accompanies her and supplements the history.  She had a DaTscan  done since our last visit.  I personally reviewed the images.  There was significant with decreased radiotracer uptake bilaterally, left greater than right.  MRI brain was completed since last visit which was fairly unremarkable.  There was mild small vessel disease.  I personally reviewed that.    ALLERGIES:   Allergies  Allergen Reactions   Doxycycline    Mercury     Migraines/ headaches due to dental fillings    Methocarbamol      Dizziness, syncope    Benzoin Rash    Makes skin peel    CURRENT MEDICATIONS:  Current Meds   Medication Sig   amLODipine -olmesartan  (AZOR ) 10-40 MG tablet Take 1 tablet by mouth daily.   aspirin  EC 81 MG tablet Take 1 tablet (81 mg total) by mouth daily. Swallow whole.   AZOR  10-40 MG per tablet Take 1 tablet by mouth at bedtime.   BIOTIN PO Take 2,000 mcg by mouth daily.   calcium -vitamin D (OSCAL WITH D) 500-200 MG-UNIT tablet Take 1 tablet by mouth daily.   cholecalciferol (VITAMIN D) 1000 units tablet Take 1,000 Units by mouth daily.   simvastatin (ZOCOR) 10 MG tablet Take 10 mg by mouth daily.   vitamin C  (ASCORBIC ACID) 500 MG tablet Take 1 tablet (500 mg total) by mouth daily.     Objective:   PHYSICAL EXAMINATION:    VITALS:   Vitals:   02/11/24 0900  BP: (!) 140/70  Pulse: 67  SpO2: 97%  Weight: 183 lb 3.2 oz (83.1 kg)  Height: 5' 7 (1.702 m)    GEN:  The patient appears stated age and is in NAD. HEENT:  Normocephalic, atraumatic.  The mucous membranes are moist. The superficial temporal arteries are without ropiness or tenderness. CV:  RRR Lungs:  CTAB Neck/HEME:  There are no carotid bruits bilaterally.  Neurological examination:  Orientation: The patient is alert and oriented x3. Cranial nerves: There is good facial symmetry with no significant facial hypomimia. The speech is fluent and clear. Soft palate rises symmetrically and there is no tongue deviation. Hearing  is intact to conversational tone. Sensation: Sensation is intact to light touch throughout Motor: Strength is at least antigravity x4.  Movement examination: Tone: There is nl tone in the bilateral upper extremities.  The tone in the lower extremities is nl.  Abnormal movements: there is RUE rest tremor that increases with distraction procedures Coordination:  There is no decremation with RAM's, with any form of RAMS, including alternating supination and pronation of the forearm, hand opening and closing, finger taps, heel taps and toe taps.  Gait and Station: The patient has no  difficulty arising out of a deep-seated chair without the use of the hands. The patient's stride length is good but a bit antalgic with mild decreased arm swing on the right.    I have reviewed and interpreted the following labs independently    Chemistry      Component Value Date/Time   NA 138 05/26/2022 1013   K 4.8 05/26/2022 1013   CL 105 05/26/2022 1013   CO2 24 05/26/2022 1013   BUN 12 05/26/2022 1013   CREATININE 0.96 05/26/2022 1013      Component Value Date/Time   CALCIUM  9.3 05/26/2022 1013   ALKPHOS 54 02/25/2022 0223   AST 19 02/25/2022 0223   ALT 17 02/25/2022 0223   BILITOT 0.4 02/25/2022 0223       Lab Results  Component Value Date   WBC 8.3 05/26/2022   HGB 16.3 (H) 05/26/2022   HCT 49.9 (H) 05/26/2022   MCV 89.4 05/26/2022   PLT 260 05/26/2022    Lab Results  Component Value Date   TSH 1.904 02/25/2022     Total time spent on today's visit was 47 minutes, including both face-to-face time and nonface-to-face time.  Time included that spent on review of records (prior notes available to me/labs/imaging if pertinent), discussing treatment and goals, answering patient's questions and coordinating care.  Cc:  Tena Feeling, MD

## 2024-02-11 ENCOUNTER — Encounter: Payer: Self-pay | Admitting: Neurology

## 2024-02-11 ENCOUNTER — Ambulatory Visit: Admitting: Neurology

## 2024-02-11 VITALS — BP 140/70 | HR 67 | Ht 67.0 in | Wt 183.2 lb

## 2024-02-11 DIAGNOSIS — G20C Parkinsonism, unspecified: Secondary | ICD-10-CM

## 2024-02-11 NOTE — Patient Instructions (Signed)
 SAVE THE DATE!  We are planning a Parkinsons Disease educational symposium at [facility] in [place] on September 19.  More details to come!  We will have a movement disorder physician expert from [employer] coming to speak and a caregiver speaker.  We will have a panel of experts that will show you who you may need on your team of people on your journey with Parkinsons.  If you would like to be added to our email list to get further information, email [email or web address].  I hope to see you there!

## 2024-03-24 DIAGNOSIS — Z79899 Other long term (current) drug therapy: Secondary | ICD-10-CM | POA: Diagnosis not present

## 2024-03-24 DIAGNOSIS — E7800 Pure hypercholesterolemia, unspecified: Secondary | ICD-10-CM | POA: Diagnosis not present

## 2024-03-24 DIAGNOSIS — R55 Syncope and collapse: Secondary | ICD-10-CM | POA: Diagnosis not present

## 2024-03-24 DIAGNOSIS — I10 Essential (primary) hypertension: Secondary | ICD-10-CM | POA: Diagnosis not present

## 2024-03-24 DIAGNOSIS — Z8673 Personal history of transient ischemic attack (TIA), and cerebral infarction without residual deficits: Secondary | ICD-10-CM | POA: Diagnosis not present

## 2024-03-24 DIAGNOSIS — G20A1 Parkinson's disease without dyskinesia, without mention of fluctuations: Secondary | ICD-10-CM | POA: Diagnosis not present

## 2024-03-24 DIAGNOSIS — Z0000 Encounter for general adult medical examination without abnormal findings: Secondary | ICD-10-CM | POA: Diagnosis not present

## 2024-03-24 DIAGNOSIS — Z23 Encounter for immunization: Secondary | ICD-10-CM | POA: Diagnosis not present

## 2024-03-24 DIAGNOSIS — M810 Age-related osteoporosis without current pathological fracture: Secondary | ICD-10-CM | POA: Diagnosis not present

## 2024-04-15 ENCOUNTER — Ambulatory Visit: Admitting: Neurology

## 2024-05-18 ENCOUNTER — Encounter: Payer: Self-pay | Admitting: Neurology

## 2024-07-12 ENCOUNTER — Other Ambulatory Visit (HOSPITAL_COMMUNITY)
Admission: RE | Admit: 2024-07-12 | Discharge: 2024-07-12 | Disposition: A | Discharge status: Home / Self Care | Source: Ambulatory Visit | Attending: Obstetrics & Gynecology | Admitting: Obstetrics & Gynecology

## 2024-07-12 ENCOUNTER — Encounter (HOSPITAL_BASED_OUTPATIENT_CLINIC_OR_DEPARTMENT_OTHER): Payer: Self-pay | Admitting: Obstetrics & Gynecology

## 2024-07-12 ENCOUNTER — Ambulatory Visit (HOSPITAL_BASED_OUTPATIENT_CLINIC_OR_DEPARTMENT_OTHER): Admitting: Obstetrics & Gynecology

## 2024-07-12 VITALS — BP 131/88 | HR 77 | Wt 182.4 lb

## 2024-07-12 DIAGNOSIS — Z7982 Long term (current) use of aspirin: Secondary | ICD-10-CM | POA: Diagnosis not present

## 2024-07-12 DIAGNOSIS — G459 Transient cerebral ischemic attack, unspecified: Secondary | ICD-10-CM

## 2024-07-12 DIAGNOSIS — N898 Other specified noninflammatory disorders of vagina: Secondary | ICD-10-CM | POA: Insufficient documentation

## 2024-07-12 DIAGNOSIS — N889 Noninflammatory disorder of cervix uteri, unspecified: Secondary | ICD-10-CM | POA: Diagnosis not present

## 2024-07-12 DIAGNOSIS — Z4689 Encounter for fitting and adjustment of other specified devices: Secondary | ICD-10-CM

## 2024-07-12 DIAGNOSIS — Z8673 Personal history of transient ischemic attack (TIA), and cerebral infarction without residual deficits: Secondary | ICD-10-CM | POA: Diagnosis not present

## 2024-07-12 DIAGNOSIS — Z124 Encounter for screening for malignant neoplasm of cervix: Secondary | ICD-10-CM | POA: Insufficient documentation

## 2024-07-12 DIAGNOSIS — N812 Incomplete uterovaginal prolapse: Secondary | ICD-10-CM

## 2024-07-12 MED ORDER — METRONIDAZOLE 0.75 % VA GEL: 1.0000 | Freq: Every day | VAGINAL | 0 refills | Status: AC

## 2024-07-12 NOTE — Progress Notes (Unsigned)
   GYNECOLOGY  VISIT  CC:   vaginal spotting   HPI: 73 y.o. G2P2 Married White or Caucasian female here for concerns about having some increased vaginal discharge with her pessary that started about two months ago.  She has noticed spotting with insertion of the pessary.  She feels the pessary isn't holding up the prolapse as well as it did earlier in the year.  Denies urinary or bowel issues.  Denies pelvic pain.    Has been diagnosed with Parkinson's.  Only symptom is her right hand tremor.  Not on any treatment at this time.  Patient's last menstrual period was 09/02/1998.  Past Medical History:  Diagnosis Date   Arthritis    Elevated lipids    Hypertension    Osteoarthritis of right knee    Severe   Osteopenia    right hip   Parkinson's disease (HCC) 2025    MEDS:  Reviewed in EPIC  ALLERGIES: Doxycycline, Mercury, Methocarbamol , and Benzoin  SH:  married, non smoker  Review of Systems  Constitutional: Negative.   Genitourinary:        Spotting with pessary placement    PHYSICAL EXAMINATION:    BP 131/88 (BP Location: Left Arm, Patient Position: Sitting, Cuff Size: Normal)   Pulse 77   Wt 182 lb 6.4 oz (82.7 kg)   LMP 09/02/1998   SpO2 98%   BMI 28.57 kg/m     General appearance: alert, cooperative and appears stated age Lymph:  no inguinal LAD noted  Pelvic: External genitalia:  no lesions              Urethra:  normal appearing urethra with no masses, tenderness or lesions              Bartholins and Skenes: normal                 Vagina: normal mucosa without prolapse or lesions, cystocele with incomplete uterine prolapse, no ulcerations noted              Cervix: vascular appearing lesion on anterior cervix that does bleed, appears irritated, pap obtained              Bimanual Exam:  Uterus:  normal size, contour, position, consistency, mobility, non-tender              Adnexa: no mass, fullness, tenderness  Pessary was removed prior to pelvic exam.   Given finding on cervix, felt she would benefit from leaving pessary out but she says she cannot tolerate how the prolapse feels.  She is able to remove and replace I feel she will just put it right back in so I replaced this today.  Chaperone was present for exam.  Assessment/Plan: 1. Vaginal discharge (Primary) - testing for yeast/BV obtained today - Cervicovaginal ancillary only( [facility]) - metroNIDAZOLE  (METROGEL ) 0.75 % vaginal gel; Place 1 Applicatorful vaginally at bedtime. Use for 5 nights.  Dispense: 70 g; Refill: 0  2. Cervical cancer screening - Cytology - PAP( [facility])  3. Lesion of cervix - this appears to be an irritated Nabothian cyst  4. TIA (transient ischemic attack) - on baby ASA

## 2024-07-13 LAB — CYTOLOGY - PAP: Diagnosis: NEGATIVE

## 2024-07-14 ENCOUNTER — Ambulatory Visit (HOSPITAL_BASED_OUTPATIENT_CLINIC_OR_DEPARTMENT_OTHER): Payer: Self-pay | Admitting: Obstetrics & Gynecology

## 2024-07-14 LAB — CERVICOVAGINAL ANCILLARY ONLY
Bacterial Vaginitis (gardnerella): NEGATIVE
Candida Glabrata: NEGATIVE
Candida Vaginitis: NEGATIVE
Comment: NEGATIVE
Comment: NEGATIVE
Comment: NEGATIVE

## 2024-08-02 ENCOUNTER — Ambulatory Visit (HOSPITAL_BASED_OUTPATIENT_CLINIC_OR_DEPARTMENT_OTHER): Admitting: Obstetrics & Gynecology

## 2024-08-02 ENCOUNTER — Encounter (HOSPITAL_BASED_OUTPATIENT_CLINIC_OR_DEPARTMENT_OTHER): Payer: Self-pay | Admitting: Obstetrics & Gynecology

## 2024-08-02 VITALS — BP 121/77 | HR 77 | Wt 184.0 lb

## 2024-08-02 DIAGNOSIS — Z4689 Encounter for fitting and adjustment of other specified devices: Secondary | ICD-10-CM | POA: Diagnosis not present

## 2024-08-02 DIAGNOSIS — N812 Incomplete uterovaginal prolapse: Secondary | ICD-10-CM | POA: Diagnosis not present

## 2024-08-02 DIAGNOSIS — N8111 Cystocele, midline: Secondary | ICD-10-CM

## 2024-08-02 NOTE — Progress Notes (Deleted)
   GYNECOLOGY  VISIT  CC:   No chief complaint on file.   HPI: 73 y.o. G2P2 Married White or Caucasian female here for follow up for pessary check/vaginal discharge.  Patient's last menstrual period was 09/02/1998.  Past Medical History:  Diagnosis Date   Arthritis    Elevated lipids    Hypertension    Osteoarthritis of right knee    Severe   Osteopenia    right hip   Parkinson's disease (HCC) 2025    MEDS:  Reviewed in EPIC  ALLERGIES: Doxycycline, Mercury, Methocarbamol , and Benzoin  SH:  ***  ROS  PHYSICAL EXAMINATION:    LMP 09/02/1998     General appearance: alert, cooperative and appears stated age Neck: no adenopathy, supple, symmetrical, trachea midline and thyroid  {CHL AMB PHY EX THYROID  NORM [phone]::normal to inspection and palpation} CV:  {Exam; heart brief:31539} Lungs:  {pe lungs ob:314451} Breasts: {Exam; breast:13139::normal appearance, no masses or tenderness} Abdomen: soft, non-tender; bowel sounds normal; no masses,  no organomegaly Lymph:  no inguinal LAD noted  Pelvic: External genitalia:  no lesions              Urethra:  normal appearing urethra with no masses, tenderness or lesions              Bartholins and Skenes: normal                 Vagina: {exam; pelvic vaginal:30846}              Cervix: {CHL AMB PHY EX CERVIX NORM DEFAULT:[phone]::no lesions}              Bimanual Exam:  Uterus:  {CHL AMB PHY EX UTERUS NORM [phone]::normal size, contour, position, consistency, mobility, non-tender}              Adnexa: {CHL AMB PHY EX ADNEXA NO MASS [phone]::no mass, fullness, tenderness}              Rectovaginal: {yes no:[id number]}.  Confirms.              Anus:  normal sphincter tone, no lesions  Chaperone was present for exam.  Assessment/Plan: There are no diagnoses linked to this encounter.

## 2024-08-02 NOTE — Progress Notes (Signed)
 73 y.o. Married White female G2P2 here for pessary check and follow up on vaginal discharge.  She reports improvement in discharge and bleeding since removing the pessary a couple of weeks ago.  She is sexually active.  Has really felt a lot of prolapse with the pessary out and glad to have the pessary replaced today.   Allergies  Allergen Reactions   Doxycycline    Mercury     Migraines/ headaches due to dental fillings    Methocarbamol      Dizziness, syncope    Benzoin Rash    Makes skin peel    ROS: Denies:  vaginal bleeding or discharge  Exam:   BP 121/77 (BP Location: Left Arm, Patient Position: Sitting, Cuff Size: Normal)   Pulse 77   Wt 184 lb (83.5 kg)   LMP 09/02/1998   SpO2 98%   BMI 28.82 kg/m   General appearance: alert and no distress Inguinal lymph nodes:  not enlarged  Pelvic: External genitalia:  no lesions              Urethra: normal appearing urethra with no masses, tenderness or lesions              Bartholins and Skenes: Bartholin's, Urethra, Skene's normal                 Vagina: normal appearing vagina with normal color and discharge, no lesions, 3rd degree cystocele with 3rd degree uterine prolapse noted              Cervix: normal appearance   Pap obtained:  no  Pessary was replaced without difficulty. Patient tolerated procedure well.    Assessment/Plan: 1. Cystocele, midline (Primary) - pessary replaced without difficulty.  Discussed coconut oil use vs estradiol cream use.  She will use coconut oil when replacing the pessary.  She takes out, cleans and replaced about every two weeks. - discussed urogyn referral.  We have discussed in the past but she is not interested in this right now.  Will let me know if changes her mind.    2. Incomplete uterine prolapse

## 2024-08-16 ENCOUNTER — Telehealth (HOSPITAL_BASED_OUTPATIENT_CLINIC_OR_DEPARTMENT_OTHER): Payer: Self-pay

## 2024-08-16 ENCOUNTER — Other Ambulatory Visit (HOSPITAL_BASED_OUTPATIENT_CLINIC_OR_DEPARTMENT_OTHER): Payer: Self-pay

## 2024-08-16 DIAGNOSIS — N8111 Cystocele, midline: Secondary | ICD-10-CM

## 2024-08-16 DIAGNOSIS — N812 Incomplete uterovaginal prolapse: Secondary | ICD-10-CM

## 2024-08-16 NOTE — Telephone Encounter (Signed)
 Pt called and stated that she had spoke with Dr. Cleotilde previously about seeing a provider at Urogy[facility]cology. Now she would like a referral sent.

## 2024-08-16 NOTE — Telephone Encounter (Signed)
 Spoke with patient and advised that referral to Urogyn was placed and that they be calling her to schedule an appointment. Patient verbalized understanding with no further questions or concerns.  Morna LOISE Quale, RN

## 2024-09-03 ENCOUNTER — Ambulatory Visit: Admitting: Neurology

## 2024-09-06 ENCOUNTER — Encounter (HOSPITAL_BASED_OUTPATIENT_CLINIC_OR_DEPARTMENT_OTHER): Payer: Self-pay | Admitting: Obstetrics & Gynecology

## 2024-09-10 NOTE — Progress Notes (Unsigned)
 "   Assessment/Plan:   1.  Parkinsonism, likely Parkinsons Disease   -DaTscan  done in June, 2025 with significant decreased activity in the basal ganglia, left greater than right (left putamen).  -lengthy discussion with many questions/answers.  - After long discussion, we collectively decided to add carbidopa /levodopa  25/100 and work to 1 tablet at 7 AM/11 AM/4 PM. R/b/se discussed extensively and understanding was expressed.  - Patient is doing really great with her exercise.  He asked me about exercise goals and we discussed this today.  2.  Syncope with long history of syncope even into childhood  -another episode in 02/2024.  Was seated while they were at dinner and she was staring off and nonresponsive.  Husband asked about whether or not this was Parkinson's disease.  This is very doubtful given that Parkinson's is quite mild and she was seated at the a time of the event.  I would strongly encourage her to follow-up with primary care and make sure that there is not an arrhythmia.  She does have a long history of syncope per patient  Subjective:  Discussed the use of AI scribe software for clinical note transcription with the patient, who gave verbal consent to proceed.  History of Present Illness Lisa Wheeler is a 74 year old female with Parkinsonism who presents for follow-up of her symptoms.  Husband present and supplements history.  She experiences persistent tremors in her right hand, which are constant but primarily occur at rest. Despite the tremors, she can perform tasks such as writing and painting her fingernails without difficulty, and there have been no changes in her handwriting. She exercises regularly, engaging in cardio daily and weight training three times a week. The tremors may be slightly more significant than previously, but not dramatically so. No tremors are present in her right leg, left arm, or left leg. No falls reported.  She mentions having a prolapsed  bladder for which she uses a pessary. She is considering corrective surgery for her prolapsed bladder and mentioned she will be seeing a urogynecologist soon.  Her husband describes an episode of near syncope that occurred while dining out in June, where she became unresponsive. This was similar to an episode from two and a half years ago, where she did lose consciousness. Her partner notes that her blood pressure was low during the first episode, but she was unable to measure it during the recent event. She was able to walk to the car afterward and was fine.  No loss of bladder or bowel control.  No shaking.  No hallucinations or unusual dreams recently. She has not started any new medications or received new medical diagnoses since her last visit.   Current movement disorder medications: None    ALLERGIES:   Allergies  Allergen Reactions   Doxycycline    Mercury     Migraines/ headaches due to dental fillings    Methocarbamol      Dizziness, syncope    Benzoin Rash    Makes skin peel    CURRENT MEDICATIONS:  Current Meds  Medication Sig   amLODipine -olmesartan  (AZOR ) 10-40 MG tablet Take 1 tablet by mouth daily.   aspirin  EC 81 MG tablet Take 1 tablet (81 mg total) by mouth daily. Swallow whole.   BIOTIN PO Take 2,000 mcg by mouth daily.   calcium -vitamin D (OSCAL WITH D) 500-200 MG-UNIT tablet Take 1 tablet by mouth daily.   cholecalciferol (VITAMIN D) 1000 units tablet Take 1,000 Units by mouth daily.  metroNIDAZOLE  (METROGEL ) 0.75 % vaginal gel Place 1 Applicatorful vaginally at bedtime. Use for 5 nights.   simvastatin (ZOCOR) 10 MG tablet Take 10 mg by mouth daily.   vitamin C  (ASCORBIC ACID) 500 MG tablet Take 1 tablet (500 mg total) by mouth daily.     Objective:   PHYSICAL EXAMINATION:    VITALS:   Vitals:   09/13/24 1354  BP: 137/77  Pulse: 77  SpO2: 96%  Weight: 187 lb 12.8 oz (85.2 kg)     GEN:  The patient appears stated age and is in NAD. HEENT:   Normocephalic, atraumatic.  The mucous membranes are moist. The superficial temporal arteries are without ropiness or tenderness. CV:  RRR Lungs:  CTAB Neck/HEME:  There are no carotid bruits bilaterally.  Neurological examination:  Orientation: The patient is alert and oriented x3. Cranial nerves: There is good facial symmetry with no significant facial hypomimia. The speech is fluent and clear. Soft palate rises symmetrically and there is no tongue deviation. Hearing is intact to conversational tone. Sensation: Sensation is intact to light touch throughout Motor: Strength is at least antigravity x4.  Movement examination: Tone: There is mild increased tone in the RUE Abnormal movements: there is RUE rest tremor that increases with distraction procedures Coordination:  There is no decremation with RAM's, with any form of RAMS, including alternating supination and pronation of the forearm, hand opening and closing, finger taps, heel taps and toe taps.  Gait and Station: The patient pushes off to arise. The patient's stride length is good with good arm swing today  I have reviewed and interpreted the following labs independently    Chemistry      Component Value Date/Time   NA 138 05/26/2022 1013   K 4.8 05/26/2022 1013   CL 105 05/26/2022 1013   CO2 24 05/26/2022 1013   BUN 12 05/26/2022 1013   CREATININE 0.96 05/26/2022 1013      Component Value Date/Time   CALCIUM  9.3 05/26/2022 1013   ALKPHOS 54 02/25/2022 0223   AST 19 02/25/2022 0223   ALT 17 02/25/2022 0223   BILITOT 0.4 02/25/2022 0223       Lab Results  Component Value Date   WBC 8.3 05/26/2022   HGB 16.3 (H) 05/26/2022   HCT 49.9 (H) 05/26/2022   MCV 89.4 05/26/2022   PLT 260 05/26/2022    Lab Results  Component Value Date   TSH 1.904 02/25/2022     Total time spent on today's visit was 40 minutes, including both face-to-face time and nonface-to-face time.  Time included that spent on review of records  (prior notes available to me/labs/imaging if pertinent), discussing treatment and goals, answering patient's questions and coordinating care.  Cc:  Dwight Trula SQUIBB, MD  "

## 2024-09-13 ENCOUNTER — Ambulatory Visit: Admitting: Neurology

## 2024-09-13 ENCOUNTER — Encounter: Payer: Self-pay | Admitting: Neurology

## 2024-09-13 VITALS — BP 137/77 | HR 77 | Wt 187.8 lb

## 2024-09-13 DIAGNOSIS — R55 Syncope and collapse: Secondary | ICD-10-CM

## 2024-09-13 DIAGNOSIS — G20A1 Parkinson's disease without dyskinesia, without mention of fluctuations: Secondary | ICD-10-CM

## 2024-09-13 MED ORDER — CARBIDOPA-LEVODOPA 25-100 MG PO TABS: 1.0000 | ORAL_TABLET | Freq: Three times a day (TID) | ORAL | 1 refills | Status: AC

## 2024-09-13 NOTE — Patient Instructions (Signed)
 Start Carbidopa  Levodopa  as follows: Take 1/2 tablet three times daily, at least 30 minutes before meals (approximately 7am/11am/4pm), for one week Then take 1/2 tablet in the morning, 1/2 tablet in the afternoon at 11am, 1 tablet in the evening at 4pm, at least 30 minutes before meals, for one week Then take 1/2 tablet in the morning, 1 tablet in the afternoon at 11am , 1 tablet in the evening at 4pm, at least 30 minutes before meals, for one week Then take 1 tablet three times daily at 7am/11am/4pm, at least 30 minutes before meals   As a reminder, carbidopa /levodopa  can be taken at the same time as a carbohydrate, but we like to have you take your pill either 30 minutes before a protein source or 1 hour after as protein can interfere with carbidopa /levodopa  absorption.    VISIT SUMMARY: During today's visit, we discussed your persistent right hand tremors and other symptoms related to Parkinsonism. We also reviewed your recent episode of near syncope and your plans to see a urogynecologist for your prolapsed bladder. We have initiated a new medication to help manage your symptoms and provided instructions on how to take it.  YOUR PLAN: -PARKINSONISM: Parkinsonism is a condition characterized by tremors, stiffness, and slow movement. To help manage your persistent right hand tremors, we have started you on carbidopa -levodopa . You will begin with half a tablet three times a day for one week, then adjust the dosage as instructed. Take the medication on an empty stomach or with carbohydrates, and avoid protein 30 minutes before taking it. We have sent your prescription to [facility] in [place]. Additionally, please discuss any blood pressure fluctuations with your primary care provider.  INSTRUCTIONS: Follow the medication administration instructions provided and monitor your symptoms. Discuss any blood pressure fluctuations with your primary care provider. Continue with your plans to see a  urogynecologist for your prolapsed bladder. If you experience any side effects or have concerns about the medication, please contact our office.                      Contains text generated by [employer].                                 Contains text generated by [employer].

## 2024-11-08 ENCOUNTER — Ambulatory Visit (HOSPITAL_BASED_OUTPATIENT_CLINIC_OR_DEPARTMENT_OTHER): Payer: Medicare Other | Admitting: Obstetrics & Gynecology

## 2024-11-08 ENCOUNTER — Encounter (HOSPITAL_BASED_OUTPATIENT_CLINIC_OR_DEPARTMENT_OTHER): Payer: Self-pay

## 2024-11-24 ENCOUNTER — Ambulatory Visit: Admitting: Obstetrics and Gynecology

## 2025-01-10 ENCOUNTER — Ambulatory Visit (HOSPITAL_BASED_OUTPATIENT_CLINIC_OR_DEPARTMENT_OTHER): Payer: Self-pay | Admitting: Obstetrics & Gynecology

## 2025-03-16 ENCOUNTER — Ambulatory Visit: Payer: Self-pay | Admitting: Neurology
